# Patient Record
Sex: Male | Born: 1960 | Race: White | Hispanic: No | Smoking: Former smoker
Health system: Southern US, Community
[De-identification: ages and names within clinical notes are randomized; demographics above are authoritative.]

## PROBLEM LIST (undated history)

## (undated) DIAGNOSIS — C4490 Unspecified malignant neoplasm of skin, unspecified: Secondary | ICD-10-CM

## (undated) DIAGNOSIS — C8330 Diffuse large B-cell lymphoma, unspecified site: Secondary | ICD-10-CM

## (undated) DIAGNOSIS — Z87442 Personal history of urinary calculi: Secondary | ICD-10-CM

## (undated) DIAGNOSIS — R519 Headache, unspecified: Secondary | ICD-10-CM

## (undated) DIAGNOSIS — G720 Drug-induced myopathy: Secondary | ICD-10-CM

## (undated) DIAGNOSIS — I639 Cerebral infarction, unspecified: Secondary | ICD-10-CM

## (undated) DIAGNOSIS — F988 Other specified behavioral and emotional disorders with onset usually occurring in childhood and adolescence: Secondary | ICD-10-CM

## (undated) DIAGNOSIS — J449 Chronic obstructive pulmonary disease, unspecified: Secondary | ICD-10-CM

## (undated) DIAGNOSIS — T466X5A Adverse effect of antihyperlipidemic and antiarteriosclerotic drugs, initial encounter: Secondary | ICD-10-CM

## (undated) DIAGNOSIS — K219 Gastro-esophageal reflux disease without esophagitis: Secondary | ICD-10-CM

## (undated) DIAGNOSIS — E041 Nontoxic single thyroid nodule: Secondary | ICD-10-CM

## (undated) DIAGNOSIS — M76892 Other specified enthesopathies of left lower limb, excluding foot: Secondary | ICD-10-CM

## (undated) DIAGNOSIS — E785 Hyperlipidemia, unspecified: Secondary | ICD-10-CM

## (undated) DIAGNOSIS — I10 Essential (primary) hypertension: Secondary | ICD-10-CM

## (undated) DIAGNOSIS — M1990 Unspecified osteoarthritis, unspecified site: Secondary | ICD-10-CM

## (undated) DIAGNOSIS — M76891 Other specified enthesopathies of right lower limb, excluding foot: Secondary | ICD-10-CM

## (undated) DIAGNOSIS — G5603 Carpal tunnel syndrome, bilateral upper limbs: Secondary | ICD-10-CM

## (undated) HISTORY — PX: PORTACATH PLACEMENT: SHX2246

## (undated) HISTORY — PX: TONSILLECTOMY: SUR1361

## (undated) HISTORY — PX: LIVER BIOPSY: SHX301

## (undated) HISTORY — PX: MOHS SURGERY: SHX181

---

## 2004-11-02 ENCOUNTER — Other Ambulatory Visit: Payer: Self-pay

## 2004-11-02 ENCOUNTER — Observation Stay: Payer: Self-pay | Admitting: Internal Medicine

## 2004-11-17 ENCOUNTER — Ambulatory Visit: Payer: Self-pay | Admitting: Internal Medicine

## 2006-05-31 ENCOUNTER — Ambulatory Visit: Payer: Self-pay | Admitting: Internal Medicine

## 2006-07-14 ENCOUNTER — Ambulatory Visit: Payer: Self-pay | Admitting: Gastroenterology

## 2006-12-18 ENCOUNTER — Ambulatory Visit: Payer: Self-pay

## 2007-06-14 ENCOUNTER — Ambulatory Visit: Payer: Self-pay | Admitting: Internal Medicine

## 2013-10-19 ENCOUNTER — Emergency Department: Payer: Self-pay | Admitting: Internal Medicine

## 2013-10-21 ENCOUNTER — Emergency Department: Payer: Self-pay | Admitting: Emergency Medicine

## 2013-11-25 ENCOUNTER — Ambulatory Visit: Payer: Self-pay | Admitting: Orthopedic Surgery

## 2013-12-04 ENCOUNTER — Ambulatory Visit: Payer: Self-pay | Admitting: Internal Medicine

## 2014-08-13 ENCOUNTER — Ambulatory Visit
Admit: 2014-08-13 | Disposition: A | Discharge status: Home / Self Care | Payer: Self-pay | Attending: Internal Medicine | Admitting: Internal Medicine

## 2015-01-07 DIAGNOSIS — F988 Other specified behavioral and emotional disorders with onset usually occurring in childhood and adolescence: Secondary | ICD-10-CM | POA: Insufficient documentation
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
## 2016-05-12 ENCOUNTER — Ambulatory Visit: Payer: Self-pay | Admitting: Urology

## 2017-01-28 ENCOUNTER — Emergency Department
Admission: EM | Admit: 2017-01-28 | Discharge: 2017-01-28 | Disposition: A | Discharge status: Home / Self Care | Payer: Self-pay | Attending: Emergency Medicine | Admitting: Emergency Medicine

## 2017-01-28 ENCOUNTER — Encounter: Payer: Self-pay | Admitting: Emergency Medicine

## 2017-01-28 DIAGNOSIS — B372 Candidiasis of skin and nail: Secondary | ICD-10-CM | POA: Insufficient documentation

## 2017-01-28 DIAGNOSIS — Y929 Unspecified place or not applicable: Secondary | ICD-10-CM | POA: Insufficient documentation

## 2017-01-28 DIAGNOSIS — W57XXXA Bitten or stung by nonvenomous insect and other nonvenomous arthropods, initial encounter: Secondary | ICD-10-CM | POA: Insufficient documentation

## 2017-01-28 DIAGNOSIS — Y939 Activity, unspecified: Secondary | ICD-10-CM | POA: Insufficient documentation

## 2017-01-28 DIAGNOSIS — Z87891 Personal history of nicotine dependence: Secondary | ICD-10-CM | POA: Insufficient documentation

## 2017-01-28 DIAGNOSIS — S80862A Insect bite (nonvenomous), left lower leg, initial encounter: Secondary | ICD-10-CM | POA: Insufficient documentation

## 2017-01-28 DIAGNOSIS — Y999 Unspecified external cause status: Secondary | ICD-10-CM | POA: Insufficient documentation

## 2017-01-28 DIAGNOSIS — B3749 Other urogenital candidiasis: Secondary | ICD-10-CM | POA: Insufficient documentation

## 2017-01-28 DIAGNOSIS — L089 Local infection of the skin and subcutaneous tissue, unspecified: Secondary | ICD-10-CM | POA: Insufficient documentation

## 2017-01-28 DIAGNOSIS — I10 Essential (primary) hypertension: Secondary | ICD-10-CM | POA: Insufficient documentation

## 2017-01-28 HISTORY — DX: Gastro-esophageal reflux disease without esophagitis: K21.9

## 2017-01-28 HISTORY — DX: Other specified behavioral and emotional disorders with onset usually occurring in childhood and adolescence: F98.8

## 2017-01-28 HISTORY — DX: Essential (primary) hypertension: I10

## 2017-01-28 MED ORDER — MICONAZOLE NITRATE 2 % EX CREA: TOPICAL_CREAM | CUTANEOUS | Status: DC

## 2017-01-28 MED ORDER — BACITRACIN ZINC 500 UNIT/GM EX OINT
TOPICAL_OINTMENT | Freq: Once | CUTANEOUS | Status: AC
  Administered 2017-01-28: 1 via TOPICAL
  Filled 2017-01-28: qty 0.9

## 2017-01-28 MED ORDER — FLUCONAZOLE 200 MG PO TABS: 200.0000 mg | ORAL_TABLET | ORAL | 0 refills | Status: AC

## 2017-01-28 MED ORDER — MICONAZOLE NITRATE 2 % EX CREA
TOPICAL_CREAM | CUTANEOUS | 1 refills | Status: AC
Start: 2017-01-28 — End: 2018-01-28

## 2017-01-28 MED ORDER — CEPHALEXIN 500 MG PO CAPS
500.0000 mg | ORAL_CAPSULE | Freq: Once | ORAL | Status: AC
  Administered 2017-01-28: 500 mg via ORAL
  Filled 2017-01-28: qty 1

## 2017-01-28 MED ORDER — CEPHALEXIN 500 MG PO CAPS: 500.0000 mg | ORAL_CAPSULE | Freq: Three times a day (TID) | ORAL | 0 refills | Status: DC

## 2017-01-28 NOTE — ED Provider Notes (Signed)
[facility] Emergency Department Provider Note ____________________________________________  Time seen: 1707  I have reviewed the triage vital signs and the nursing notes.  HISTORY  Chief Complaint  Rash and Insect Bite  HPI Joel [name] is a 56 y.o. male Presents to the ED for evaluation of an itchy, red, and weeping rash to the left side of his inner thigh and groin. He describes the area has worsened over the last 10-12 days. He denies any significant benefit with home, over-the-counter treatments. Her reports intiially using cortisone cream, which caused the rash to worsen significantly.he then use some tenderness in spray, but denies any significant change. He presents with a hot, red, itchy rash to the groin that he describes as feeling like "50 bee stings." he denies any fevers, chills, sweats.  Past Medical History:  Diagnosis Date  . ADD (attention deficit disorder)   . GERD (gastroesophageal reflux disease)   . Hypertension    There are no active problems to display for this patient.  History reviewed. No pertinent surgical history.  Prior to Admission medications   Medication Sig Start Date End Date Taking? Authorizing Provider  cephALEXin (KEFLEX) 500 MG capsule Take 1 capsule (500 mg total) by mouth 3 (three) times daily. 01/28/17   Katriona Schmierer, Charlesetta Ivory, PA-C  fluconazole (DIFLUCAN) 200 MG tablet Take 1 tablet (200 mg total) by mouth once a week. 01/28/17 02/19/17  Mayte Diers, Charlesetta Ivory, PA-C  miconazole (MICATIN) 2 % cream Apply to affected area 2 times daily 01/28/17 01/28/18  Idell Hissong, Charlesetta Ivory, PA-C   Allergies Penicillins  Family History  Problem Relation Age of Onset  . Cancer Sister     Social History Social History  Substance Use Topics  . Smoking status: Former Games developer  . Smokeless tobacco: Never Used  . Alcohol use Yes    Review of Systems  Constitutional: Negative for fever. Cardiovascular: Negative for chest  pain. Respiratory: Negative for shortness of breath. Gastrointestinal: Negative for abdominal pain, vomiting and diarrhea. Genitourinary: Negative for dysuria. Musculoskeletal: Negative for back pain. Skin: positive for rash. Neurological: Negative for headaches, focal weakness or numbness. ____________________________________________  PHYSICAL EXAM:  VITAL SIGNS: ED Triage Vitals [01/28/17 1611]  Enc Vitals Group     BP 123/83     Pulse Rate (!) 109     Resp 18     Temp 99.5 F (37.5 C)     Temp Source Oral     SpO2 96 %     Weight 245 lb (111.1 kg)     Height  (1.753 m)     Head Circumference      Peak Flow      Pain Score      Pain Loc      Pain Edu?      Excl. in GC?     Constitutional: Alert and oriented. Well appearing and in no distress. Head: Normocephalic and atraumatic. Cardiovascular: Normal distal pulses. Respiratory: Normal respiratory effort.  Gastrointestinal: Soft and nontender. No distention. Musculoskeletal: Nontender with normal range of motion in all extremities.  Skin:  Skin is warm, dry and intact. Patient with a large area of intertriginous candidal infection noted to the left side of the groin and into the perineum. The area which is also under his small pannus, shows weeping, erythema, and was demarcated border. He also has a single infected insect bite to the lower left leg. There is surrounding erythema around the central scabbed lesion.  ____________________________________________  PROCEDURES  Keflex 500 mg PO Miconazole 2% cream ____________________________________________  INITIAL IMPRESSION / ASSESSMENT AND PLAN / ED COURSE  Patient with the ED evaluation The Lisa Infection to the Intertriginous Areas of His Abdomen and Groin. The Patient Is Discharged with Prescription for Miconazole Cream,  As well as fluconazole to dose weekly for 4 weeks. He is given instructions on hygiene care that should include wearing cotton or merino wool  underwear. He is also advised to dry the area well after cleansing with mild soap and to consider going without underwear while at home. He should follow up with the local community clinics for ongoing symptom management. ____________________________________________  FINAL CLINICAL IMPRESSION(S) / ED DIAGNOSES  Final diagnoses:  Candida infection of genital region  Intertriginous candidiasis  Infected insect bite of leg, left, initial encounter      Lissa Hoard, PA-C 01/28/17 1803    Sharman Cheek, MD 01/29/17 1615

## 2017-01-28 NOTE — Discharge Instructions (Signed)
Your are being treated for a candidal yeast infection. Use the anti-fungal ointment twice daily. Take the antibiotic as directed. Keep the skin of the groin clean and dry. Use cotton underwear to promote dry, healing skin. Follow-up with a local community clinic for continued symptoms.

## 2017-01-28 NOTE — ED Notes (Signed)
Telfa nonstick dressings applied to patient's L sided groin per PA's instructions.

## 2017-01-28 NOTE — ED Notes (Signed)
NAD noted at time of D/C. Pt denies questions or concerns. Pt ambulatory to the lobby at this time.  

## 2017-01-28 NOTE — ED Triage Notes (Signed)
Rash to inner groin on the left side and insect bite on left lower calf for the past 10 days, has tried home treatments but no relief. Pt reports rash in groin feels like "50 bee stings"

## 2017-05-31 ENCOUNTER — Emergency Department
Admission: EM | Admit: 2017-05-31 | Discharge: 2017-06-01 | Disposition: A | Discharge status: Home / Self Care | Payer: Self-pay | Attending: Emergency Medicine | Admitting: Emergency Medicine

## 2017-05-31 ENCOUNTER — Other Ambulatory Visit: Payer: Self-pay

## 2017-05-31 ENCOUNTER — Emergency Department: Payer: Self-pay

## 2017-05-31 DIAGNOSIS — Z87891 Personal history of nicotine dependence: Secondary | ICD-10-CM | POA: Insufficient documentation

## 2017-05-31 DIAGNOSIS — J111 Influenza due to unidentified influenza virus with other respiratory manifestations: Secondary | ICD-10-CM | POA: Insufficient documentation

## 2017-05-31 DIAGNOSIS — Z79899 Other long term (current) drug therapy: Secondary | ICD-10-CM | POA: Insufficient documentation

## 2017-05-31 DIAGNOSIS — M609 Myositis, unspecified: Secondary | ICD-10-CM | POA: Insufficient documentation

## 2017-05-31 DIAGNOSIS — R69 Illness, unspecified: Secondary | ICD-10-CM

## 2017-05-31 DIAGNOSIS — I10 Essential (primary) hypertension: Secondary | ICD-10-CM | POA: Insufficient documentation

## 2017-05-31 LAB — URINALYSIS, COMPLETE (UACMP) WITH MICROSCOPIC
BACTERIA UA: NONE SEEN
BILIRUBIN URINE: NEGATIVE
Glucose, UA: NEGATIVE mg/dL
Ketones, ur: 5 mg/dL — AB
LEUKOCYTES UA: NEGATIVE
NITRITE: NEGATIVE
PROTEIN: NEGATIVE mg/dL
Specific Gravity, Urine: 1.003 — ABNORMAL LOW (ref 1.005–1.030)
Squamous Epithelial / LPF: NONE SEEN
WBC UA: NONE SEEN WBC/hpf (ref 0–5)
pH: 6 (ref 5.0–8.0)

## 2017-05-31 LAB — BASIC METABOLIC PANEL
Anion gap: 11 (ref 5–15)
BUN: 24 mg/dL — AB (ref 6–20)
CO2: 22 mmol/L (ref 22–32)
Calcium: 9.2 mg/dL (ref 8.9–10.3)
Chloride: 104 mmol/L (ref 101–111)
Creatinine, Ser: 0.95 mg/dL (ref 0.61–1.24)
GFR calc Af Amer: >60 mL/min (ref >60)
Glucose, Bld: 98 mg/dL (ref 65–99)
POTASSIUM: 3.4 mmol/L — AB (ref 3.5–5.1)
Sodium: 137 mmol/L (ref 135–145)

## 2017-05-31 LAB — INFLUENZA PANEL BY PCR (TYPE A & B)
INFLAPCR: NEGATIVE
Influenza B By PCR: NEGATIVE

## 2017-05-31 LAB — CBC
HEMATOCRIT: 48.3 % (ref 40.0–52.0)
HEMOGLOBIN: 16.6 g/dL (ref 13.0–18.0)
MCH: 29.5 pg (ref 26.0–34.0)
MCHC: 34.5 g/dL (ref 32.0–36.0)
MCV: 85.5 fL (ref 80.0–100.0)
Platelets: 208 10*3/uL (ref 150–440)
RBC: 5.65 MIL/uL (ref 4.40–5.90)
RDW: 13.7 % (ref 11.5–14.5)
WBC: 10.6 10*3/uL (ref 3.8–10.6)

## 2017-05-31 LAB — CK: Total CK: 746 U/L — ABNORMAL HIGH (ref 49–397)

## 2017-05-31 LAB — TROPONIN I: Troponin I: <0.03 ng/mL (ref <0.03)

## 2017-05-31 MED ORDER — SODIUM CHLORIDE 0.9 % IV BOLUS (SEPSIS)
1000.0000 mL | Freq: Once | INTRAVENOUS | Status: AC
  Administered 2017-05-31: 1000 mL via INTRAVENOUS

## 2017-05-31 MED ORDER — NAPROXEN 500 MG PO TABS: 500.0000 mg | ORAL_TABLET | Freq: Two times a day (BID) | ORAL | 0 refills | Status: AC

## 2017-05-31 MED ORDER — IRBESARTAN 150 MG PO TABS
300.0000 mg | ORAL_TABLET | ORAL | Status: AC
  Administered 2017-05-31: 300 mg via ORAL
  Filled 2017-05-31: qty 2

## 2017-05-31 MED ORDER — ONDANSETRON 4 MG PO TBDP: 4.0000 mg | ORAL_TABLET | Freq: Three times a day (TID) | ORAL | 0 refills | Status: AC | PRN

## 2017-05-31 MED ORDER — ONDANSETRON HCL 4 MG/2ML IJ SOLN
4.0000 mg | Freq: Once | INTRAMUSCULAR | Status: AC
  Administered 2017-05-31: 4 mg via INTRAVENOUS
  Filled 2017-05-31: qty 2

## 2017-05-31 MED ORDER — HYDROCHLOROTHIAZIDE 25 MG PO TABS
25.0000 mg | ORAL_TABLET | ORAL | Status: AC
  Administered 2017-05-31: 25 mg via ORAL
  Filled 2017-05-31: qty 1

## 2017-05-31 MED ORDER — PANTOPRAZOLE SODIUM 40 MG IV SOLR
40.0000 mg | Freq: Once | INTRAVENOUS | Status: AC
  Administered 2017-05-31: 40 mg via INTRAVENOUS
  Filled 2017-05-31: qty 40

## 2017-05-31 MED ORDER — KETOROLAC TROMETHAMINE 30 MG/ML IJ SOLN
15.0000 mg | INTRAMUSCULAR | Status: AC
  Administered 2017-05-31: 15 mg via INTRAVENOUS
  Filled 2017-05-31: qty 1

## 2017-05-31 MED ORDER — DEXAMETHASONE SODIUM PHOSPHATE 10 MG/ML IJ SOLN
10.0000 mg | Freq: Once | INTRAMUSCULAR | Status: AC
  Administered 2017-05-31: 10 mg via INTRAVENOUS
  Filled 2017-05-31: qty 1

## 2017-05-31 MED ORDER — TELMISARTAN-HCTZ 80-25 MG PO TABS: 1.0000 | ORAL_TABLET | Freq: Every day | ORAL | 0 refills | Status: AC

## 2017-05-31 NOTE — ED Triage Notes (Signed)
Pt reports chest pain all day in midsternal area - c/o shortness of breath - diaphoresis noted - pt is clammy - c/o nausea - denies vomiting

## 2017-05-31 NOTE — ED Provider Notes (Signed)
[facility] Emergency Department Provider Note  ____________________________________________  Time seen: Approximately 10:23 PM  I have reviewed the triage vital signs and the nursing notes.   HISTORY  Chief Complaint Chest Pain    HPI Joel [name] is a 57 y.o. male who complains of constant chest painall day that started this morning. Nonradiating. Not associated with nausea or vomiting. He does have diaphoresis and shortness of breath. This is in the setting of generalized body aches. No exertional symptoms, not pleuritic. He reports feeling profoundly fatigued. He was in his usual state of health yesterday when he visited his daughter for dinner. Denies cough runny nose or sore throat. Has not had a flu shot this year. He rates the body aches as moderate to severe.   He also notes he is been out of his blood pressure medicine since October. He takes Micardis hct 80/25.  Past Medical History:  Diagnosis Date  . ADD (attention deficit disorder)   . GERD (gastroesophageal reflux disease)   . Hypertension      There are no active problems to display for this patient.    History reviewed. No pertinent surgical history.   Prior to Admission medications   Medication Sig Start Date End Date Taking? Authorizing Provider  cephALEXin (KEFLEX) 500 MG capsule Take 1 capsule (500 mg total) by mouth 3 (three) times daily. 01/28/17   Menshew, Charlesetta IvoryJenise V Bacon, PA-C  miconazole (MICATIN) 2 % cream Apply to affected area 2 times daily 01/28/17 01/28/18  Menshew, Charlesetta IvoryJenise V Bacon, PA-C  naproxen (NAPROSYN) 500 MG tablet Take 1 tablet (500 mg total) by mouth 2 (two) times daily with a meal. 05/31/17   Sharman CheekStafford, Teneisha Gignac, MD  ondansetron (ZOFRAN ODT) 4 MG disintegrating tablet Take 1 tablet (4 mg total) by mouth every 8 (eight) hours as needed for nausea or vomiting. 05/31/17   Sharman CheekStafford, Mykal Kirchman, MD  telmisartan-hydrochlorothiazide (MICARDIS HCT) 80-25 MG tablet Take 1  tablet by mouth daily. 05/31/17   Sharman CheekStafford, Esaiah Wanless, MD     Allergies Hydrocodone-acetaminophen and Penicillins   Family History  Problem Relation Age of Onset  . Cancer Sister     Social History Social History   Tobacco Use  . Smoking status: Former Games developermoker  . Smokeless tobacco: Never Used  Substance Use Topics  . Alcohol use: No    Frequency: Never  . Drug use: No    Review of Systems  Constitutional:   No fever positive chills.  ENT:   No sore throat. No rhinorrhea. Cardiovascular:   Positive chest pain as above without syncope. Respiratory:   No dyspnea or cough. Gastrointestinal:   Negative for abdominal pain, vomiting and diarrhea.  Musculoskeletal:   Positive generalized body aches All other systems reviewed and are negative except as documented above in ROS and HPI.  ____________________________________________   PHYSICAL EXAM:  VITAL SIGNS: ED Triage Vitals  Enc Vitals Group     BP 05/31/17 1937 (!) 181/101     Pulse Rate 05/31/17 1937 (!) 114     Resp 05/31/17 1937 17     Temp 05/31/17 1937 (!) 100.6 F (38.1 C)     Temp Source 05/31/17 1937 Oral     SpO2 05/31/17 1937 92 %     Weight 05/31/17 1935 240 lb (108.9 kg)     Height 05/31/17 1935 5\' 8"  (1.727 m)     Head Circumference --      Peak Flow --      Pain Score  05/31/17 1935 10     Pain Loc --      Pain Edu? --      Excl. in GC? --     Vital signs reviewed, nursing assessments reviewed.   Constitutional:   Alert and oriented. Well appearing and in no distress. Eyes:   No scleral icterus.  EOMI. No nystagmus. No conjunctival pallor. PERRL. ENT   Head:   Normocephalic and atraumatic.   Nose:   No congestion/rhinnorhea.    Mouth/Throat:   MMM, no pharyngeal erythema. No peritonsillar mass.    Neck:   No meningismus. Full ROM. Hematological/Lymphatic/Immunilogical:   No cervical lymphadenopathy. Cardiovascular:   Tachycardia heart rate 110. Symmetric bilateral radial and DP  pulses.  No murmurs.  Respiratory:   Normal respiratory effort without tachypnea/retractions. Breath sounds are clear and equal bilaterally. No wheezes/rales/rhonchi. Gastrointestinal:   Soft and nontender. Non distended. There is no CVA tenderness.  No rebound, rigidity, or guarding. Genitourinary:   deferred Musculoskeletal:   Normal range of motion in all extremities. No joint effusions.  No lower extremity tenderness.  No edema. Neurologic:   Normal speech and language.  Motor grossly intact. No acute focal neurologic deficits are appreciated.  Skin:    Skin is warm, dry and intact. No rash noted.  No petechiae, purpura, or bullae.  ____________________________________________    LABS (pertinent positives/negatives) (all labs ordered are listed, but only abnormal results are displayed) Labs Reviewed  BASIC METABOLIC PANEL - Abnormal; Notable for the following components:      Result Value   Potassium 3.4 (*)    BUN 24 (*)    All other components within normal limits  CK - Abnormal; Notable for the following components:   Total CK 746 (*)    All other components within normal limits  TROPONIN I  CBC  INFLUENZA PANEL BY PCR (TYPE A & B)  TROPONIN I   ____________________________________________   EKG  Interpreted by me Sinus tachycardia rate 1:15, left axis, normal intervals. LVH with repolarization abnormality. Right bundle-branch block. No acute ischemic changes.  ____________________________________________    RADIOLOGY  Dg Chest 2 View  Result Date: 05/31/2017 CLINICAL DATA:  58 year old male with a history of chest pain EXAM: CHEST  2 VIEW COMPARISON:  10/19/2013 FINDINGS: Cardiomediastinal silhouette unchanged in size and contour with tortuosity of the thoracic aorta. Coarsened interstitial markings. No confluent airspace disease. No pleural effusion or pneumothorax. No acute bony abnormality. IMPRESSION: Negative for acute cardiopulmonary disease Electronically  Signed   By: Gilmer Mor D.O.   On: 05/31/2017 19:56    ____________________________________________   PROCEDURES Procedures  ____________________________________________    CLINICAL IMPRESSION / ASSESSMENT AND PLAN / ED COURSE  Pertinent labs & imaging results that were available during my care of the patient were reviewed by me and considered in my medical decision making (see chart for details).   Patient presents with vague constitutional symptoms. Low-grade fever, tachycardia, consistent with dehydration. Influenza-like illness. Check a flu PCR. Initial labs and chest x-ray unremarkable. EKG nondiagnostic, nonischemic.Considering the patient's symptoms, medical history, and physical examination today, I have low suspicion for ACS, PE, TAD, pneumothorax, carditis, mediastinitis, pneumonia, CHF, or sepsis.  This appears most likely to be a viral syndrome. Treat symptomatically with IV fluids, Zofran, Toradol, Decadron, reassess. Check a second troponin.  Clinical Course as of Jun 01 2255  Wed May 31, 2017  2104 Mycardis hct 80/25 for blood pressure. Ambien, amphetamine salts for add/sleep  [PS]  2232 Flu  pcr negative. Serial troponin ordered.   [PS]  2248 Mild myositis. Continue IVF. If second troponin negative, would be suitable for outpt f/u with sx management. CK Total: (!) 746 [PS]    Clinical Course User Index [PS] Sharman Cheek, MD     ----------------------------------------- 10:58 PM on 05/31/2017 -----------------------------------------  Care patient will be signed out to Dr. Manson Passey at 11:00 PM to follow-up on second troponin  ____________________________________________   FINAL CLINICAL IMPRESSION(S) / ED DIAGNOSES    Final diagnoses:  Essential hypertension  Influenza-like illness  Myositis, unspecified myositis type, unspecified site       Portions of this note were generated with dragon dictation software. Dictation errors may occur  despite best attempts at proofreading.    Sharman Cheek, MD 05/31/17 2258

## 2017-06-01 LAB — TROPONIN I: Troponin I: <0.03 ng/mL (ref <0.03)

## 2017-10-31 ENCOUNTER — Encounter: Payer: Self-pay | Admitting: Nurse Practitioner

## 2017-11-02 ENCOUNTER — Telehealth: Payer: Self-pay

## 2017-11-02 NOTE — Telephone Encounter (Signed)
Returned patients call to schedule his colonoscopy, however unable to lvm due to mailbox being full.  Thanks [employer]le

## 2017-11-15 ENCOUNTER — Encounter: Payer: Self-pay | Admitting: *Deleted

## 2018-09-25 ENCOUNTER — Other Ambulatory Visit: Payer: Self-pay | Admitting: Family Medicine

## 2018-09-25 ENCOUNTER — Other Ambulatory Visit (HOSPITAL_COMMUNITY): Payer: Self-pay | Admitting: Emergency Medicine

## 2018-09-25 ENCOUNTER — Other Ambulatory Visit (HOSPITAL_COMMUNITY): Payer: Self-pay | Admitting: Family Medicine

## 2018-09-25 ENCOUNTER — Other Ambulatory Visit: Payer: Self-pay

## 2018-09-25 ENCOUNTER — Ambulatory Visit
Admission: RE | Admit: 2018-09-25 | Discharge: 2018-09-25 | Disposition: A | Discharge status: Home / Self Care | Payer: Self-pay | Source: Ambulatory Visit | Attending: Family Medicine | Admitting: Family Medicine

## 2018-09-25 DIAGNOSIS — N200 Calculus of kidney: Secondary | ICD-10-CM | POA: Insufficient documentation

## 2022-01-07 ENCOUNTER — Encounter: Payer: Self-pay | Admitting: *Deleted

## 2022-01-12 ENCOUNTER — Encounter: Payer: Self-pay | Admitting: Urology

## 2022-01-12 ENCOUNTER — Ambulatory Visit: Payer: BC Managed Care – PPO | Admitting: Urology

## 2022-01-12 VITALS — BP 121/85 | HR 111 | Ht 68.0 in | Wt 250.0 lb

## 2022-01-12 DIAGNOSIS — N401 Enlarged prostate with lower urinary tract symptoms: Secondary | ICD-10-CM | POA: Diagnosis not present

## 2022-01-12 DIAGNOSIS — N529 Male erectile dysfunction, unspecified: Secondary | ICD-10-CM

## 2022-01-12 DIAGNOSIS — R972 Elevated prostate specific antigen [PSA]: Secondary | ICD-10-CM | POA: Diagnosis not present

## 2022-01-12 MED ORDER — TAMSULOSIN HCL 0.4 MG PO CAPS: 0.4000 mg | ORAL_CAPSULE | Freq: Every day | ORAL | 11 refills | Status: DC

## 2022-01-12 NOTE — Progress Notes (Unsigned)
01/12/2022 2:52 PM   Joel [name] Joel [name] August 29, 1960 [id number]  Referring provider: Marguarite Arbour, MD [address] [facility] [place],  [place] [address]  Chief Complaint  Patient presents with   Elevated PSA    HPI: Joel [name] is a 61 y.o. male referred for evaluation of an elevated PSA.  PSA 11/29/2021 elevated 9.84; a repeat PSA performed 12/31/2021 was 10.83 Previous PSA December 2017 was 5.40 and 3.59 in October 2016 No family history prostate cancer He does have bothersome lower urinary tract symptoms and including sensation incomplete emptying, intermittent urinary stream, urgency, weak stream and nocturia x2 for the last 6-8 months. No prior treatment for his LUTS IPSS today 26/35 Also complains of difficulty achieving and maintaining an erection Organic risk factors include hypertension, antihypertensive medication and tobacco history   PMH: Past Medical History:  Diagnosis Date   ADD (attention deficit disorder)    GERD (gastroesophageal reflux disease)    Hypertension     Surgical History: History reviewed. No pertinent surgical history.  Home Medications:  Allergies as of 01/12/2022       Reactions   Hydrocodone-acetaminophen Other (See Comments)   Penicillins    Childhood allergy        Medication List        Accurate as of January 12, 2022  2:52 PM. If you have any questions, ask your nurse or doctor.          cephALEXin 500 MG capsule Commonly known as: Keflex Take 1 capsule (500 mg total) by mouth 3 (three) times daily.   naproxen 500 MG tablet Commonly known as: Naprosyn Take 1 tablet (500 mg total) by mouth 2 (two) times daily with a meal.   ondansetron 4 MG disintegrating tablet Commonly known as: Zofran ODT Take 1 tablet (4 mg total) by mouth every 8 (eight) hours as needed for nausea or vomiting.   telmisartan-hydrochlorothiazide 80-25 MG tablet Commonly known as: MICARDIS HCT Take 1 tablet by mouth  daily.   telmisartan-hydrochlorothiazide 80-25 MG tablet Commonly known as: MICARDIS HCT Take 1 tablet by mouth daily.        Allergies:  Allergies  Allergen Reactions   Hydrocodone-Acetaminophen Other (See Comments)   Penicillins     Childhood allergy    Family History: Family History  Problem Relation Age of Onset   Cancer Sister     Social History:  reports that he has quit smoking. He has never used smokeless tobacco. He reports that he does not drink alcohol and does not use drugs.   Physical Exam: BP 121/85   Pulse (!) 111   Ht 5\' 8"  (1.727 m)   Wt 250 lb (113.4 kg)   BMI 38.01 kg/m   Constitutional:  Alert and oriented, No acute distress. HEENT: [place] AT Respiratory: Normal respiratory effort, no increased work of breathing. GI: Abdomen is soft, nontender, nondistended, no abdominal masses GU: Prostate 40 g, smooth without nodules Psychiatric: Normal mood and affect.    Assessment & Plan:    1.  Elevated PSA Although PSA is a prostate cancer screening test he was informed that cancer is not the most common cause of an elevated PSA. Other potential causes including BPH and inflammation were discussed. He was informed that the only way to adequately diagnose prostate cancer would be a transrectal ultrasound and biopsy of the prostate. The procedure was discussed including potential risks of bleeding and infection/sepsis. He was also informed that a negative biopsy does  not conclusively rule out the possibility that prostate cancer may be present and that continued monitoring is required. The use of newer adjunctive blood tests including 4kScore was discussed. The use of multiparametric prostate MRI to assess abnormality suspicious for high-grade prostate cancer and aid in targeted biopsy was reviewed. Continued periodic surveillance was also discussed but not recommended. He would like to initially proceed with prostate MRI   2.  BPH with LUTS Severe LUTS Rx  tamsulosin 0.4 mg daily  3.  Erectile dysfunction Reassess on follow-up   Riki Altes, MD  [facility] [address], [address] [place], [place] [address] [phone]

## 2022-04-01 ENCOUNTER — Other Ambulatory Visit: Payer: Self-pay

## 2022-04-01 DIAGNOSIS — I10 Essential (primary) hypertension: Secondary | ICD-10-CM | POA: Insufficient documentation

## 2022-04-01 DIAGNOSIS — R0789 Other chest pain: Secondary | ICD-10-CM | POA: Insufficient documentation

## 2022-04-01 DIAGNOSIS — M5412 Radiculopathy, cervical region: Secondary | ICD-10-CM | POA: Insufficient documentation

## 2022-04-01 DIAGNOSIS — R200 Anesthesia of skin: Secondary | ICD-10-CM | POA: Insufficient documentation

## 2022-04-01 LAB — COMPREHENSIVE METABOLIC PANEL
ALT: 28 U/L (ref 0–44)
AST: 25 U/L (ref 15–41)
Albumin: 4.2 g/dL (ref 3.5–5.0)
Alkaline Phosphatase: 71 U/L (ref 38–126)
Anion gap: 12 (ref 5–15)
BUN: 20 mg/dL (ref 8–23)
CO2: 22 mmol/L (ref 22–32)
Calcium: 9.6 mg/dL (ref 8.9–10.3)
Chloride: 103 mmol/L (ref 98–111)
Creatinine, Ser: 1 mg/dL (ref 0.61–1.24)
GFR, Estimated: >60 mL/min (ref >60)
Glucose, Bld: 103 mg/dL — ABNORMAL HIGH (ref 70–99)
Potassium: 3.7 mmol/L (ref 3.5–5.1)
Sodium: 137 mmol/L (ref 135–145)
Total Bilirubin: 0.8 mg/dL (ref 0.3–1.2)
Total Protein: 7 g/dL (ref 6.5–8.1)

## 2022-04-01 LAB — CBC
HCT: 46 % (ref 39.0–52.0)
Hemoglobin: 15.6 g/dL (ref 13.0–17.0)
MCH: 29.5 pg (ref 26.0–34.0)
MCHC: 33.9 g/dL (ref 30.0–36.0)
MCV: 87 fL (ref 80.0–100.0)
Platelets: 253 10*3/uL (ref 150–400)
RBC: 5.29 MIL/uL (ref 4.22–5.81)
RDW: 13.2 % (ref 11.5–15.5)
WBC: 12.2 10*3/uL — ABNORMAL HIGH (ref 4.0–10.5)
nRBC: 0 % (ref 0.0–0.2)

## 2022-04-01 LAB — TROPONIN I (HIGH SENSITIVITY)
Troponin I (High Sensitivity): 5 ng/L (ref <18)
Troponin I (High Sensitivity): 5 ng/L (ref <18)

## 2022-04-01 NOTE — ED Notes (Signed)
EKG to EDP Siadecki in person.  

## 2022-04-01 NOTE — ED Notes (Signed)
Blue, lt grn, lav tubes sent to lab.

## 2022-04-01 NOTE — ED Triage Notes (Signed)
Pt in from home due to L arm sharp pain and numbness; 8/10 pain in chest; constant. SOB, nauseous x2 days. Denies sweatiness. History of HTN. Denies heart attacks or stent placement. Pt in NAD.

## 2022-04-01 NOTE — ED Provider Triage Note (Signed)
Emergency Medicine Provider Triage Evaluation Note  Joel [name] , a 61 y.o. male  was evaluated in triage.  Pt complains of chest pain. Pain in left arm started yesterday and now has left side chest pain with the arm pain. Pain is constant. Some shortness of breath and nausea as well.  Physical Exam  There were no vitals taken for this visit. Gen:   Awake, no distress   Resp:  Normal effort  MSK:   Moves extremities without difficulty  Other:    Medical Decision Making  Medically screening exam initiated at 6:13 PM.  Appropriate orders placed.  Joel [name] was informed that the remainder of the evaluation will be completed by another provider, this initial triage assessment does not replace that evaluation, and the importance of remaining in the ED until their evaluation is complete.   Chinita Pester, FNP 04/01/22 1814

## 2022-04-01 NOTE — ED Notes (Addendum)
Pt states has been working with PCP in relation to SOB intermittently x1 month. Denies swelling to any extremity.

## 2022-04-01 NOTE — ED Notes (Signed)
Provider CBT assessing pt.  

## 2022-04-02 ENCOUNTER — Emergency Department
Admission: EM | Admit: 2022-04-02 | Discharge: 2022-04-02 | Disposition: A | Discharge status: Home / Self Care | Payer: Self-pay | Attending: Emergency Medicine | Admitting: Emergency Medicine

## 2022-04-02 DIAGNOSIS — R200 Anesthesia of skin: Secondary | ICD-10-CM

## 2022-04-02 DIAGNOSIS — M5412 Radiculopathy, cervical region: Secondary | ICD-10-CM

## 2022-04-02 DIAGNOSIS — R0789 Other chest pain: Secondary | ICD-10-CM

## 2022-04-02 MED ORDER — GABAPENTIN 100 MG PO CAPS: 100.0000 mg | ORAL_CAPSULE | Freq: Three times a day (TID) | ORAL | 0 refills | Status: DC

## 2022-04-02 MED ORDER — GABAPENTIN 100 MG PO CAPS
100.0000 mg | ORAL_CAPSULE | ORAL | Status: AC
  Administered 2022-04-02: 100 mg via ORAL
  Filled 2022-04-02: qty 1

## 2022-04-02 NOTE — ED Notes (Signed)
Pt is pacing back and forth in room ready to leave.

## 2022-04-02 NOTE — Discharge Instructions (Addendum)
Your workup in the Emergency Department today was reassuring.  We did not find any specific abnormalities.  We recommend you drink plenty of fluids, take your regular medications and/or any new ones prescribed today, and follow up with the doctor(s) listed in these documents as recommended.  We put in a referral to cardiology and they should be reaching out to you at the next available opportunity to schedule a follow-up appointment.  You can also follow-up with your regular doctor to discuss the symptoms in your arm that appeared to be cervical radiculopathy she does have some additional work-up is needed.  Return to the Emergency Department if you develop new or worsening symptoms that concern you.

## 2022-04-02 NOTE — ED Provider Notes (Signed)
[facility] Provider Note    Event Date/Time   First MD Initiated Contact with Patient 04/02/22 0006     (approximate)   History   No chief complaint on file.   HPI  Joel [name] is a 61 y.o. male who presents for evaluation of numbness and aching pain in his left arm that has been present for a couple of days.  He does not remember any particular injury but he said that it hurts worse when he raises his arm up in certain positions including over his head.  He does not really have any pain in his neck but sometimes it seems to radiate up through his shoulder to the base of the left side of his neck.  He has never had these kinds of symptoms before.  He does not believe he had any particular kind of trauma or injury, at least that he can remember.  He has had no weakness in the arm, just the pain that is making it very difficult for him to rest or to think about anything else.  He notes that for a couple of weeks he has also had some pain in his chest and occasionally felt short of breath particular with exertion.  Nothing in particular seems to make it better or worse but it has been worrying him.  Then when the left arm pain started he became concerned that something worse might be happening.  He has no history of heart disease or ACS, no known lung disease, no history of blood clots in the legs of the lungs, no recent surgeries or immobilizations, no unilateral leg pain or swelling.     Physical Exam   Triage Vital Signs: ED Triage Vitals  Enc Vitals Group     BP 04/01/22 1813 127/73     Pulse Rate 04/01/22 1813 95     Resp 04/01/22 1813 19     Temp 04/01/22 1813 98.6 F (37 C)     Temp Source 04/01/22 1813 Oral     SpO2 04/01/22 1813 99 %     Weight 04/01/22 1815 106.6 kg (235 lb)     Height 04/01/22 1815 1.702 m (5\' 7" )     Head Circumference --      Peak Flow --      Pain Score 04/01/22 1814 8     Pain Loc --      Pain Edu? --      Excl. in  GC? --     Most recent vital signs: Vitals:   04/01/22 1813 04/01/22 2245  BP: 127/73 (!) 134/108  Pulse: 95 88  Resp: 19 18  Temp: 98.6 F (37 C) 98.6 F (37 C)  SpO2: 99% 97%     General: Awake, no distress.  CV:  Good peripheral perfusion.  Normal heart sounds.  Regular rate and rhythm. Resp:  Normal effort.  Lungs are clear to auscultation bilaterally. Abd:  No distention.  No tenderness to palpation. Other:  The patient has some tenderness to manipulation of the left arm with range of motion of the shoulder and particularly with raising the arm above the head.  This would suggest musculoskeletal or neuropathic pain but he does not have a particular distribution of pain in the arm that would suggest, for example, either ulnar or radial pain.  The compartments are soft and easily compressible, normal range of motion in spite of discomfort.  He has no focal neurological deficits and good grip  strength in bilateral major muscle strength in his arms and his legs.  He is ambulatory without any difficulty.   ED Results / Procedures / Treatments   Labs (all labs ordered are listed, but only abnormal results are displayed) Labs Reviewed  CBC - Abnormal; Notable for the following components:      Result Value   WBC 12.2 (*)    All other components within normal limits  COMPREHENSIVE METABOLIC PANEL - Abnormal; Notable for the following components:   Glucose, Bld 103 (*)    All other components within normal limits  TROPONIN I (HIGH SENSITIVITY)  TROPONIN I (HIGH SENSITIVITY)     EKG  ED ECG REPORT I, Loleta Rose, the attending physician, personally viewed and interpreted this ECG.  Date: 04/01/2022 EKG Time: 18: 18 Rate: 87 Rhythm: normal sinus rhythm QRS Axis: normal Intervals: Bifascicular block (right bundle blanch block and left anterior fascicular block), LVH ST/T Wave abnormalities: Non-specific ST segment / T-wave changes, but no clear evidence of acute  ischemia. Narrative Interpretation: no definitive evidence of acute ischemia; does not meet STEMI criteria.    PROCEDURES:  Critical Care performed: No  Procedures   MEDICATIONS ORDERED IN ED: Medications  gabapentin (NEURONTIN) capsule 100 mg (100 mg Oral Given 04/02/22 0148)     IMPRESSION / MDM / ASSESSMENT AND PLAN / ED COURSE  I reviewed the triage vital signs and the nursing notes.                              Differential diagnosis includes, but is not limited to, musculoskeletal pain, cervical radiculopathy or other neuropathic pain, ACS, PE.  Patient's presentation is most consistent with acute presentation with potential threat to life or bodily function.  Labs/studies ordered: EKG, high-sensitivity troponin x2, CMP, CBC.  Patient has a Wells score for PE of 0 and he is low risk for ACS based on HEAR score.  His lab results are reassuring including 2 completely normal and unchanged high-sensitivity troponins.  He has a very slight elevation of his white blood cell count of 12.2 but this is of unclear clinical significance in the absence of any significant infectious symptoms.  He has reproducible pain in his left arm and his description of it is a dull and aching pain that is bothersome to him particularly when he is trying to rest is suggestive of neuropathic pain.  I talked with him about various options, and I considered hospitalization for further cardiac work-up, but given the duration of his symptoms (at least weeks for his chest pain) and how the left arm symptoms do not seem to be related to the chest, as well as his reassuring EKG and high-sensitivity troponins, I think he is appropriate for discharge and close outpatient follow-up.  I talked with him about treating the pain in his arm which is his greatest concern and we agreed to try a course of gabapentin while he is pending outpatient follow-up.  I referred him to cardiology and gave strict return precautions.  He  understands and agrees with the plan.       FINAL CLINICAL IMPRESSION(S) / ED DIAGNOSES   Final diagnoses:  Atypical chest pain  Numbness and tingling in left arm  Cervical radiculopathy     Rx / DC Orders   ED Discharge Orders          Ordered    gabapentin (NEURONTIN) 100 MG capsule  3 times  daily        04/02/22 0130    Ambulatory referral to Cardiology       Comments: If you have not heard from the Cardiology office within the next 72 hours please call [phone].   04/02/22 0130             Note:  This document was prepared using Dragon voice recognition software and may include unintentional dictation errors.   Loleta Rose, MD 04/02/22 [phone]

## 2022-04-15 ENCOUNTER — Telehealth: Payer: Self-pay | Admitting: Family Medicine

## 2022-04-15 NOTE — Telephone Encounter (Signed)
Pt scheduled an STI appt and would like some information in regards to the different tests he will be having. Please call him back. Thanks

## 2022-04-25 ENCOUNTER — Ambulatory Visit: Payer: Self-pay | Admitting: Family Medicine

## 2022-04-25 ENCOUNTER — Encounter: Payer: Self-pay | Admitting: Family Medicine

## 2022-04-25 DIAGNOSIS — I10 Essential (primary) hypertension: Secondary | ICD-10-CM | POA: Insufficient documentation

## 2022-04-25 DIAGNOSIS — Z113 Encounter for screening for infections with a predominantly sexual mode of transmission: Secondary | ICD-10-CM

## 2022-04-25 LAB — HM HEPATITIS C SCREENING LAB: HM Hepatitis Screen: NEGATIVE

## 2022-04-25 LAB — HM HIV SCREENING LAB: HM HIV Screening: NEGATIVE

## 2022-04-25 NOTE — Progress Notes (Signed)
Joel [name]  Subjective:  Joel [name] is a 61 y.o. male being seen today for an STI screening [name]. The patient reports they do not have symptoms.    Patient has the following medical conditions:   Patient Active Problem List   Diagnosis Date Noted   Essential (primary) hypertension 04/25/2022     Chief Complaint  Patient presents with   SEXUALLY TRANSMITTED DISEASE    Screening- patient stated he is not having any symptoms     HPI  Patient reports to clinic for STI testing.   Last HIV test per patient/review of record was No results found for: "HMHIVSCREEN" No results found for: "HIV"  Does the patient or their partner desires a pregnancy in the next year? No  Screening for MPX risk: Does the patient have an unexplained rash? No Is the patient MSM? No Does the patient endorse multiple sex partners or anonymous sex partners? No Did the patient have close or sexual contact with a person diagnosed with MPX? No Has the patient traveled outside the [place] where MPX is endemic? No Is there a high clinical suspicion for MPX-- evidenced by one of the following No  -Unlikely to be chickenpox  -Lymphadenopathy  -Rash that present in same phase of evolution on any given body part   See flowsheet for further details and programmatic requirements.    There is no immunization history on file for this patient.   The following portions of the patient's history were reviewed and updated as appropriate: allergies, current medications, past medical history, past social history, past surgical history and problem list.  Objective:  There were no vitals filed for this [name].  Physical Exam Vitals and nursing note reviewed.  Constitutional:      Appearance: Normal appearance.  HENT:     Head: Normocephalic and atraumatic.     Mouth/Throat:     Mouth: Mucous membranes are moist.     Pharynx: No oropharyngeal exudate or posterior  oropharyngeal erythema.  Eyes:     General:        Right eye: No discharge.        Left eye: No discharge.     Conjunctiva/sclera:     Right eye: Right conjunctiva is not injected. No exudate.    Left eye: Left conjunctiva is not injected. No exudate. Pulmonary:     Effort: Pulmonary effort is normal.  Abdominal:     General: Abdomen is flat.     Palpations: Abdomen is soft. There is no hepatomegaly or mass.     Tenderness: There is no abdominal tenderness. There is no rebound.  Lymphadenopathy:     Cervical: No cervical adenopathy.     Upper Body:     Right upper body: No supraclavicular or axillary adenopathy.     Left upper body: No supraclavicular or axillary adenopathy.  Skin:    General: Skin is warm and dry.  Neurological:     Mental Status: He is alert and oriented to person, place, and time.       Assessment and Plan:  Joel [name] is a 61 y.o. male presenting to the [facility] Department for STI screening  1. Screening for venereal disease    Patient does not have STI symptoms Patient accepted all screenings including   Patient meets criteria for HepB screening? No. Ordered? not applicable Patient meets criteria for HepC screening? Yes. Ordered? yes Recommended condom use with all sex Discussed importance  of condom use for STI prevent  Treat per standing order Discussed time line for State Lab results and that patient will be called with positive results and encouraged patient to call if he had not heard in 2 weeks Recommended returning for continued or worsening symptoms.   Return if symptoms worsen or fail to improve.  Total time spent 20 minutes.  Lenice Llamas, [place]

## 2022-04-30 LAB — GONOCOCCUS CULTURE

## 2022-04-30 LAB — CHLAMYDIA/GC NAA, CONFIRMATION
Chlamydia trachomatis, NAA: NEGATIVE
Neisseria gonorrhoeae, NAA: NEGATIVE

## 2022-05-20 NOTE — Addendum Note (Signed)
Addended by: Cletis Media on: 05/20/2022 04:03 PM   Modules accepted: Orders

## 2023-02-12 ENCOUNTER — Other Ambulatory Visit: Payer: Self-pay | Admitting: Urology

## 2023-03-23 ENCOUNTER — Ambulatory Visit: Payer: BC Managed Care – PPO | Admitting: Urology

## 2023-06-08 ENCOUNTER — Other Ambulatory Visit: Payer: Self-pay | Admitting: Dermatology

## 2023-06-08 ENCOUNTER — Encounter: Payer: Self-pay | Admitting: Student

## 2023-06-08 DIAGNOSIS — D0439 Carcinoma in situ of skin of other parts of face: Secondary | ICD-10-CM

## 2023-06-13 ENCOUNTER — Ambulatory Visit
Admission: RE | Admit: 2023-06-13 | Discharge: 2023-06-13 | Disposition: A | Discharge status: Home / Self Care | Payer: Self-pay | Source: Ambulatory Visit | Attending: Dermatology | Admitting: Dermatology

## 2023-06-13 DIAGNOSIS — D0439 Carcinoma in situ of skin of other parts of face: Secondary | ICD-10-CM

## 2023-06-13 MED ORDER — IOPAMIDOL (ISOVUE-370) INJECTION 76%
75.0000 mL | Freq: Once | INTRAVENOUS | Status: AC | PRN
  Administered 2023-06-13: 75 mL via INTRAVENOUS

## 2023-07-05 DIAGNOSIS — E041 Nontoxic single thyroid nodule: Secondary | ICD-10-CM | POA: Insufficient documentation

## 2023-07-20 ENCOUNTER — Emergency Department: Payer: Self-pay

## 2023-07-20 ENCOUNTER — Other Ambulatory Visit: Payer: Self-pay

## 2023-07-20 ENCOUNTER — Emergency Department
Admission: EM | Admit: 2023-07-20 | Discharge: 2023-07-20 | Disposition: A | Discharge status: Home / Self Care | Payer: Self-pay | Attending: Emergency Medicine | Admitting: Emergency Medicine

## 2023-07-20 DIAGNOSIS — I10 Essential (primary) hypertension: Secondary | ICD-10-CM | POA: Insufficient documentation

## 2023-07-20 DIAGNOSIS — R0789 Other chest pain: Secondary | ICD-10-CM | POA: Insufficient documentation

## 2023-07-20 DIAGNOSIS — Z8673 Personal history of transient ischemic attack (TIA), and cerebral infarction without residual deficits: Secondary | ICD-10-CM | POA: Insufficient documentation

## 2023-07-20 DIAGNOSIS — R079 Chest pain, unspecified: Secondary | ICD-10-CM

## 2023-07-20 LAB — CBC
HCT: 44.2 % (ref 39.0–52.0)
Hemoglobin: 15.1 g/dL (ref 13.0–17.0)
MCH: 30.3 pg (ref 26.0–34.0)
MCHC: 34.2 g/dL (ref 30.0–36.0)
MCV: 88.6 fL (ref 80.0–100.0)
Platelets: 206 10*3/uL (ref 150–400)
RBC: 4.99 MIL/uL (ref 4.22–5.81)
RDW: 13.6 % (ref 11.5–15.5)
WBC: 7.6 10*3/uL (ref 4.0–10.5)
nRBC: 0 % (ref 0.0–0.2)

## 2023-07-20 LAB — TSH: TSH: 5.683 u[IU]/mL — ABNORMAL HIGH (ref 0.350–4.500)

## 2023-07-20 LAB — BASIC METABOLIC PANEL
Anion gap: 11 (ref 5–15)
BUN: 21 mg/dL (ref 8–23)
CO2: 25 mmol/L (ref 22–32)
Calcium: 9 mg/dL (ref 8.9–10.3)
Chloride: 102 mmol/L (ref 98–111)
Creatinine, Ser: 0.94 mg/dL (ref 0.61–1.24)
GFR, Estimated: >60 mL/min (ref >60)
Glucose, Bld: 121 mg/dL — ABNORMAL HIGH (ref 70–99)
Potassium: 3.1 mmol/L — ABNORMAL LOW (ref 3.5–5.1)
Sodium: 138 mmol/L (ref 135–145)

## 2023-07-20 LAB — TROPONIN I (HIGH SENSITIVITY)
Troponin I (High Sensitivity): 9 ng/L (ref <18)
Troponin I (High Sensitivity): 10 ng/L (ref <18)

## 2023-07-20 LAB — T4, FREE: Free T4: 1.03 ng/dL (ref 0.61–1.12)

## 2023-07-20 MED ORDER — POTASSIUM CHLORIDE CRYS ER 20 MEQ PO TBCR
40.0000 meq | EXTENDED_RELEASE_TABLET | Freq: Once | ORAL | Status: AC
  Administered 2023-07-20: 40 meq via ORAL
  Filled 2023-07-20: qty 2

## 2023-07-20 MED ORDER — ALUM & MAG HYDROXIDE-SIMETH 200-200-20 MG/5ML PO SUSP
30.0000 mL | Freq: Once | ORAL | Status: AC
  Administered 2023-07-20: 30 mL via ORAL
  Filled 2023-07-20: qty 30

## 2023-07-20 MED ORDER — ACETAMINOPHEN 500 MG PO TABS
1000.0000 mg | ORAL_TABLET | Freq: Once | ORAL | Status: AC
  Administered 2023-07-20: 1000 mg via ORAL
  Filled 2023-07-20: qty 2

## 2023-07-20 NOTE — ED Provider Notes (Addendum)
 [facility] Provider Note    Event Date/Time   First MD Initiated Contact with Patient 07/20/23 0522     (approximate)   History   Chest Pain   HPI Joel [name] is a 63 y.o. male with history of HTN presenting today for chest pain.  Patient states he was recently seen by his primary care provider where a CT scan of his head showed evidence of possible old CVA.  Does not have any strokelike symptoms but they did start him on a new cholesterol and blood pressure medication.  He states over the past 4 to 5 days he has had intermittent chest pressure symptoms.  States he woke up overnight with worsening chest pressure.  No radiation of the pain.  No obvious shortness of breath, nausea, vomiting, sweating associated with it.  Was given aspirin 324 mg with EMS.  Patient has multiple complaints that have been going on for a long time including intermittent headaches, abdominal pain, nausea which is not currently present.  He is worried he may be having side effects related to initiation of the new medications.  He is previously had myalgias related to statins.     Physical Exam   Triage Vital Signs: ED Triage Vitals  Encounter Vitals Group     BP 07/20/23 0529 125/89     Systolic BP Percentile --      Diastolic BP Percentile --      Pulse Rate 07/20/23 0529 68     Resp 07/20/23 0529 17     Temp 07/20/23 0529 98.2 F (36.8 C)     Temp Source 07/20/23 0529 Oral     SpO2 07/20/23 0522 96 %     Weight 07/20/23 0530 235 lb (106.6 kg)     Height 07/20/23 0530 5\' 6"  (1.676 m)     Head Circumference --      Peak Flow --      Pain Score 07/20/23 0530 8     Pain Loc --      Pain Education --      Exclude from Growth Chart --     Most recent vital signs: Vitals:   07/20/23 0522 07/20/23 0529  BP:  125/89  Pulse:  68  Resp:  17  Temp:  98.2 F (36.8 C)  SpO2: 96% 96%   Physical Exam: I have reviewed the vital signs and nursing notes. General: Awake,  alert, no acute distress.  Nontoxic appearing. Head:  Atraumatic, normocephalic.   ENT:  EOM intact, PERRL. Oral mucosa is pink and moist with no lesions. Neck: Neck is supple with full range of motion, No meningeal signs. Cardiovascular:  RRR, No murmurs. Peripheral pulses palpable and equal bilaterally. Respiratory:  Symmetrical chest wall expansion.  No rhonchi, rales, or wheezes.  Good air movement throughout.  No use of accessory muscles.   Musculoskeletal:  No cyanosis or edema. Moving extremities with full ROM Abdomen:  Soft, nontender, nondistended. Neuro:  GCS 15, moving all four extremities, interacting appropriately. Speech clear. Psych:  Calm, appropriate.   Skin:  Warm, dry, no rash.    ED Results / Procedures / Treatments   Labs (all labs ordered are listed, but only abnormal results are displayed) Labs Reviewed  BASIC METABOLIC PANEL - Abnormal; Notable for the following components:      Result Value   Potassium 3.1 (*)    Glucose, Bld 121 (*)    All other components within normal limits  CBC  T4, FREE  TSH  TROPONIN I (HIGH SENSITIVITY)     EKG My EKG interpretation: Rate of 66, sinus rhythm with first-degree AV block.  QTc 465.  Questionable right bundle branch block with left anterior fascicular block.  Otherwise no obvious ST elevations or depressions   RADIOLOGY Independently interpreted chest x-ray with no acute pathology   PROCEDURES:  Critical Care performed: No  Procedures   MEDICATIONS ORDERED IN ED: Medications  potassium chloride SA (KLOR-CON M) CR tablet 40 mEq (has no administration in time range)  alum & mag hydroxide-simeth (MAALOX/MYLANTA) 200-200-20 MG/5ML suspension 30 mL (has no administration in time range)  acetaminophen (TYLENOL) tablet 1,000 mg (1,000 mg Oral Given 07/20/23 0617)     IMPRESSION / MDM / ASSESSMENT AND PLAN / ED COURSE  I reviewed the triage vital signs and the nursing notes.                               Differential diagnosis includes, but is not limited to, ACS, medication side effect, reflux, costochondritis, pneumonia, pneumothorax, anxiety  Patient's presentation is most consistent with acute complicated illness / injury requiring diagnostic workup.  Patient is a 63 year old male presenting today for intermittent chest pain symptoms for many days which worsened overnight.  Currently his symptoms are very mild to nonexistent.  He has no associated symptoms at this time like radiation of the pain, nausea, sweating, or shortness of breath.  EKG without obvious ischemic changes and first troponin at 9.  CBC otherwise normal.  BMP shows slight hypokalemia at 3.1 which she was given oral repletion for.  Chest x-ray otherwise negative.  Patient already received aspirin with EMS.  Patient has multitude of complaints which are hard to differentiate between acute and chronic.  Patient has a heart score of 3 and will get repeat troponin to rule out any evidence of ACS although story does not seem highly indicative of it.  Patient signed out to oncoming provider pending repeat troponin and reassessment.  Will also try Mylanta to treat possible reflux symptoms.  He was reassessed with essentially no ongoing symptoms at this time.  We discussed if normal troponin if he wished to be admitted for further cardiac workup or have outpatient follow-up.  He stated he felt comfortable following up with cardiology outpatient would prefer that.  I do feel safe with that if his troponin on repeat is continued to be stable.  The patient is on the cardiac monitor to evaluate for evidence of arrhythmia and/or significant heart rate changes. Clinical Course as of 07/20/23 0657  Thu Jul 20, 2023  0656 Reassessed.  Asymptomatic [DW]    Clinical Course User Index [DW] Janith Lima, MD     FINAL CLINICAL IMPRESSION(S) / ED DIAGNOSES   Final diagnoses:  Chest pain, unspecified type     Rx / DC Orders   ED  Discharge Orders     None        Note:  This document was prepared using Dragon voice recognition software and may include unintentional dictation errors.   Janith Lima, MD 07/20/23 7846    Janith Lima, MD 07/20/23 [phone]

## 2023-07-20 NOTE — ED Provider Notes (Signed)
-----------------------------------------   8:37 AM on 07/20/2023 -----------------------------------------  I took over care on this patient from Dr. Anner Crete.  Repeat troponin is negative.  On reassessment, the patient appears well and remains asymptomatic.  He is stable for discharge home at this time.  Return precautions provided, the patient expressed understanding.  Cardiology referral order has been placed by Dr. Anner Crete.   Dionne Bucy, MD 07/20/23 [phone]

## 2023-07-20 NOTE — Discharge Instructions (Signed)
 I have placed a referral for you to have cardiology follow-up.  You should expect a call within the next several days to get this appointment set up.  They can also help with management of your blood pressure and cholesterol medications as well.

## 2023-07-20 NOTE — ED Triage Notes (Signed)
 Pt arrives via EMS from home with complaints of chest pain that has been going on about a week, however around 2am tonight pt states it has gotten worse 8/10 with SOB, lightheadedness.

## 2023-08-10 ENCOUNTER — Encounter: Payer: Self-pay | Admitting: *Deleted

## 2023-08-17 ENCOUNTER — Encounter: Payer: Self-pay | Admitting: Emergency Medicine

## 2023-08-18 ENCOUNTER — Encounter: Payer: Self-pay | Admitting: *Deleted

## 2023-08-18 ENCOUNTER — Encounter
Admission: RE | Disposition: A | Discharge status: Home / Self Care | Payer: Self-pay | Source: Home / Self Care | Attending: Gastroenterology

## 2023-08-18 ENCOUNTER — Ambulatory Visit
Admission: RE | Admit: 2023-08-18 | Discharge: 2023-08-18 | Disposition: A | Discharge status: Home / Self Care | Payer: Self-pay | Attending: Gastroenterology | Admitting: Gastroenterology

## 2023-08-18 ENCOUNTER — Other Ambulatory Visit: Payer: Self-pay

## 2023-08-18 ENCOUNTER — Ambulatory Visit: Payer: Self-pay | Admitting: Anesthesiology

## 2023-08-18 DIAGNOSIS — K5730 Diverticulosis of large intestine without perforation or abscess without bleeding: Secondary | ICD-10-CM | POA: Insufficient documentation

## 2023-08-18 DIAGNOSIS — D124 Benign neoplasm of descending colon: Secondary | ICD-10-CM | POA: Insufficient documentation

## 2023-08-18 DIAGNOSIS — Z1211 Encounter for screening for malignant neoplasm of colon: Secondary | ICD-10-CM | POA: Insufficient documentation

## 2023-08-18 DIAGNOSIS — I10 Essential (primary) hypertension: Secondary | ICD-10-CM | POA: Insufficient documentation

## 2023-08-18 DIAGNOSIS — J449 Chronic obstructive pulmonary disease, unspecified: Secondary | ICD-10-CM | POA: Insufficient documentation

## 2023-08-18 DIAGNOSIS — E785 Hyperlipidemia, unspecified: Secondary | ICD-10-CM | POA: Insufficient documentation

## 2023-08-18 DIAGNOSIS — F909 Attention-deficit hyperactivity disorder, unspecified type: Secondary | ICD-10-CM | POA: Insufficient documentation

## 2023-08-18 DIAGNOSIS — K640 First degree hemorrhoids: Secondary | ICD-10-CM | POA: Insufficient documentation

## 2023-08-18 DIAGNOSIS — K219 Gastro-esophageal reflux disease without esophagitis: Secondary | ICD-10-CM | POA: Insufficient documentation

## 2023-08-18 DIAGNOSIS — D123 Benign neoplasm of transverse colon: Secondary | ICD-10-CM | POA: Insufficient documentation

## 2023-08-18 HISTORY — DX: Carpal tunnel syndrome, bilateral upper limbs: G56.03

## 2023-08-18 HISTORY — DX: Other specified enthesopathies of right lower limb, excluding foot: M76.892

## 2023-08-18 HISTORY — DX: Nontoxic single thyroid nodule: E04.1

## 2023-08-18 HISTORY — DX: Adverse effect of antihyperlipidemic and antiarteriosclerotic drugs, initial encounter: G72.0

## 2023-08-18 HISTORY — PX: POLYPECTOMY: SHX149

## 2023-08-18 HISTORY — DX: Adverse effect of antihyperlipidemic and antiarteriosclerotic drugs, initial encounter: T46.6X5A

## 2023-08-18 HISTORY — DX: Chronic obstructive pulmonary disease, unspecified: J44.9

## 2023-08-18 HISTORY — DX: Hyperlipidemia, unspecified: E78.5

## 2023-08-18 HISTORY — PX: COLONOSCOPY: SHX5424

## 2023-08-18 HISTORY — DX: Cerebral infarction, unspecified: I63.9

## 2023-08-18 HISTORY — DX: Other specified enthesopathies of right lower limb, excluding foot: M76.891

## 2023-08-18 HISTORY — DX: Headache, unspecified: R51.9

## 2023-08-18 SURGERY — COLONOSCOPY
Anesthesia: General

## 2023-08-18 MED ORDER — LIDOCAINE HCL (CARDIAC) PF 100 MG/5ML IV SOSY
PREFILLED_SYRINGE | INTRAVENOUS | Status: DC | PRN
  Administered 2023-08-18: 50 mg via INTRAVENOUS

## 2023-08-18 MED ORDER — PROPOFOL 10 MG/ML IV BOLUS
INTRAVENOUS | Status: DC | PRN
  Administered 2023-08-18 (×2): 30 mg via INTRAVENOUS
  Administered 2023-08-18: 70 mg via INTRAVENOUS

## 2023-08-18 MED ORDER — PROPOFOL 500 MG/50ML IV EMUL
INTRAVENOUS | Status: DC | PRN
  Administered 2023-08-18: 120 ug/kg/min via INTRAVENOUS

## 2023-08-18 MED ORDER — SODIUM CHLORIDE 0.9 % IV SOLN: INTRAVENOUS | Status: DC

## 2023-08-18 NOTE — Anesthesia Postprocedure Evaluation (Signed)
 Anesthesia Post Note  Patient: Joel [name]  Procedure(s) Performed: COLONOSCOPY POLYPECTOMY, INTESTINE  Patient location during evaluation: Endoscopy Anesthesia Type: General Level of consciousness: awake and alert Pain management: pain level controlled Vital Signs Assessment: post-procedure vital signs reviewed and stable Respiratory status: spontaneous breathing, nonlabored ventilation, respiratory function stable and patient connected to nasal cannula oxygen Cardiovascular status: blood pressure returned to baseline and stable Postop Assessment: no apparent nausea or vomiting Anesthetic complications: no   No notable events documented.   Last Vitals:  Vitals:   08/18/23 1307 08/18/23 1308  BP: 110/72 110/72  Pulse:  77  Resp:  16  Temp: (!) 35.6 C   SpO2:  95%    Last Pain:  Vitals:   08/18/23 1327  TempSrc:   PainSc: 0-No pain                 Lenard Simmer

## 2023-08-18 NOTE — Transfer of Care (Signed)
 Immediate Anesthesia Transfer of Care Note  Patient: Joel [name]  Procedure(s) Performed: COLONOSCOPY POLYPECTOMY, INTESTINE  Patient Location: PACU and Endoscopy Unit  Anesthesia Type:General  Level of Consciousness: sedated  Airway & Oxygen Therapy: Patient Spontanous Breathing  Post-op Assessment: Report given to RN and Post -op Vital signs reviewed and stable  Post vital signs: Reviewed and stable  Last Vitals:  Vitals Value Taken Time  BP 110/72 08/18/23 1308  Temp 35.6 C 08/18/23 1307  Pulse 76 08/18/23 1309  Resp 15 08/18/23 1309  SpO2 95 % 08/18/23 1309  Vitals shown include unfiled device data.  Last Pain:  Vitals:   08/18/23 1307  TempSrc: Temporal  PainSc: Asleep         Complications: No notable events documented.

## 2023-08-18 NOTE — Op Note (Signed)
 [facility] Patient Name: Joel [name] Procedure Date: 08/18/2023 12:25 PM MRN: [id number] Account #: [id number] Date of Birth: Aug 20, 1960 Admit Type: Outpatient Age: 63 Room: [facility] ENDO ROOM 3 Gender: Male Note Status: Finalized Instrument Name: Nelda Marseille [id number] Procedure:             Colonoscopy Indications:           Screening for colorectal malignant neoplasm Providers:             Eather Colas MD, MD Medicines:             Monitored Anesthesia Care Complications:         No immediate complications. Estimated blood loss:                         Minimal. Procedure:             Pre-Anesthesia Assessment:                        - Prior to the procedure, a History and Physical was                         performed, and patient medications and allergies were                         reviewed. The patient is competent. The risks and                         benefits of the procedure and the sedation options and                         risks were discussed with the patient. All questions                         were answered and informed consent was obtained.                         Patient identification and proposed procedure were                         verified by the physician, the nurse, the                         anesthesiologist, the anesthetist and the technician                         in the endoscopy suite. Mental Status Examination:                         alert and oriented. Airway Examination: normal                         oropharyngeal airway and neck mobility. Respiratory                         Examination: clear to auscultation. CV Examination:                         normal. Prophylactic Antibiotics: The patient does not  require prophylactic antibiotics. Prior                         Anticoagulants: The patient has taken no anticoagulant                         or antiplatelet agents. ASA Grade  Assessment: III - A                         patient with severe systemic disease. After reviewing                         the risks and benefits, the patient was deemed in                         satisfactory condition to undergo the procedure. The                         anesthesia plan was to use monitored anesthesia care                         (MAC). Immediately prior to administration of                         medications, the patient was re-assessed for adequacy                         to receive sedatives. The heart rate, respiratory                         rate, oxygen saturations, blood pressure, adequacy of                         pulmonary ventilation, and response to care were                         monitored throughout the procedure. The physical                         status of the patient was re-assessed after the                         procedure.                        After obtaining informed consent, the colonoscope was                         passed under direct vision. Throughout the procedure,                         the patient's blood pressure, pulse, and oxygen                         saturations were monitored continuously. The                         Colonoscope was introduced through the anus and  advanced to the the cecum, identified by appendiceal                         orifice and ileocecal valve. The colonoscopy was                         performed without difficulty. The patient tolerated                         the procedure well. The quality of the bowel                         preparation was good. The ileocecal valve, appendiceal                         orifice, and rectum were photographed. Findings:      The perianal and digital rectal examinations were normal.      A 3 mm polyp was found in the hepatic flexure. The polyp was sessile.       The polyp was removed with a cold snare. Resection and retrieval were        complete. Estimated blood loss was minimal.      Two sessile polyps were found in the transverse colon. The polyps were 2       to 4 mm in size. These polyps were removed with a cold snare. Resection       and retrieval were complete. Estimated blood loss was minimal.      A 3 mm polyp was found in the descending colon. The polyp was sessile.       The polyp was removed with a cold snare. Resection and retrieval were       complete. Estimated blood loss was minimal.      A few small-mouthed diverticula were found in the sigmoid colon.      Internal hemorrhoids were found during retroflexion. The hemorrhoids       were Grade I (internal hemorrhoids that do not prolapse).      The exam was otherwise without abnormality on direct and retroflexion       views. Impression:            - One 3 mm polyp at the hepatic flexure, removed with                         a cold snare. Resected and retrieved.                        - Two 2 to 4 mm polyps in the transverse colon,                         removed with a cold snare. Resected and retrieved.                        - One 3 mm polyp in the descending colon, removed with                         a cold snare. Resected and retrieved.                        - Diverticulosis  in the sigmoid colon.                        - Internal hemorrhoids.                        - The examination was otherwise normal on direct and                         retroflexion views. Recommendation:        - Discharge patient to home.                        - Resume previous diet.                        - Continue present medications.                        - Await pathology results.                        - Repeat colonoscopy in 3 years for surveillance.                        - Return to referring physician as previously                         scheduled. Procedure Code(s):     --- Professional ---                        [phone], Colonoscopy, flexible; with removal of                          tumor(s), polyp(s), or other lesion(s) by snare                         technique Diagnosis Code(s):     --- Professional ---                        Z12.11, Encounter for screening for malignant neoplasm                         of colon                        D12.3, Benign neoplasm of transverse colon (hepatic                         flexure or splenic flexure)                        D12.4, Benign neoplasm of descending colon                        K64.0, First degree hemorrhoids                        K57.30, Diverticulosis of large intestine without                         perforation or abscess without bleeding  CPT copyright 2022 American Medical Association. All rights reserved. The codes documented in this report are preliminary and upon coder review may  be revised to meet current compliance requirements. Eather Colas MD, MD 08/18/2023 1:07:39 PM Number of Addenda: 0 Note Initiated On: 08/18/2023 12:25 PM Scope Withdrawal Time: 0 hours 8 minutes 58 seconds  Total Procedure Duration: 0 hours 18 minutes 58 seconds  Estimated Blood Loss:  Estimated blood loss was minimal.      [facility]

## 2023-08-18 NOTE — Anesthesia Preprocedure Evaluation (Addendum)
 Anesthesia Evaluation  Patient identified by MRN, date of birth, ID band Patient awake    Reviewed: Allergy & Precautions, H&P , NPO status , Patient's Chart, lab work & pertinent test results, reviewed documented beta blocker date and time   History of Anesthesia Complications Negative for: history of anesthetic complications  Airway Mallampati: III  TM Distance: >3 FB Neck ROM: full    Dental  (+) Dental Advidsory Given, Caps   Pulmonary neg shortness of breath, COPD, neg recent URI, former smoker   Pulmonary exam normal breath sounds clear to auscultation       Cardiovascular Exercise Tolerance: Good hypertension, (-) angina (-) Past MI and (-) Cardiac Stents negative cardio ROS Normal cardiovascular exam(-) dysrhythmias (-) Valvular Problems/Murmurs Rhythm:regular Rate:Normal  ECG 07/20/23: 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; RBBB, LAFB; bifascicular block; no STEMI  Echo 10/18/22:  Normal Stress Echocardiogram  NORMAL RIGHT VENTRICULAR SYSTOLIC FUNCTION  TRIVIAL REGURGITATION NOTED  NO VALVULAR STENOSIS NOTED     Neuro/Psych  Headaches, neg Seizures PSYCHIATRIC DISORDERS (ADHD)      TIA   GI/Hepatic negative GI ROS,GERD  ,,Jaundice today   Endo/Other  negative endocrine ROSneg diabetes    Renal/GU negative Renal ROS  negative genitourinary   Musculoskeletal   Abdominal   Peds  Hematology negative hematology ROS (+)   Anesthesia Other Findings Past Medical History: No date: ADD (attention deficit disorder) No date: Carpal tunnel syndrome, bilateral No date: COPD (chronic obstructive pulmonary disease) (HCC) No date: 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 No date: GERD (gastroesophageal reflux disease) No date: Headache No date: Hyperlipidemia No date: Hypertension No date: Statin myopathy No date: Thyroid nodule   Reproductive/Obstetrics negative OB ROS                              Anesthesia Physical Anesthesia Plan  ASA: 3  Anesthesia Plan: General   Post-op Pain Management:    Induction: Intravenous  PONV Risk Score and Plan: 2 and Propofol infusion, TIVA and Treatment may vary due to age or medical condition  Airway Management Planned: Natural Airway and Nasal Cannula  Additional Equipment:   Intra-op Plan:   Post-operative Plan:   Informed Consent: I have reviewed the patients History and Physical, chart, labs and discussed the procedure including the risks, benefits and alternatives for the proposed anesthesia with the patient or authorized representative who has indicated his/her understanding and acceptance.     Dental Advisory Given  Plan Discussed with: Anesthesiologist, CRNA and Surgeon  Anesthesia Plan Comments:         Anesthesia Quick Evaluation

## 2023-08-18 NOTE — Interval H&P Note (Signed)
 History and Physical Interval Note:  08/18/2023 12:37 PM  Joel [name]  has presented today for surgery, with the diagnosis of Screening for colon cancer (Z12.11).  The various methods of treatment have been discussed with the patient and family. After consideration of risks, benefits and other options for treatment, the patient has consented to  Procedure(s): COLONOSCOPY (N/A) as a surgical intervention.  The patient's history has been reviewed, patient examined, no change in status, stable for surgery.  I have reviewed the patient's chart and labs.  Questions were answered to the patient's satisfaction.     Regis Bill  Ok to proceed with colonoscopy

## 2023-08-18 NOTE — H&P (Signed)
 Outpatient short stay form Pre-procedure 08/18/2023  Joel Bill, MD  Primary Physician: Marguarite Arbour, MD  Reason for visit:  Screening  History of present illness:    63 y/o gentleman with history of COPD, hyperlipidemia, and hypertension here for screening colonoscopy. Last colonoscopy was 15 years ago. No blood thinners. No family history of GI malignancies. No significant abdominal surgeries.    Current Facility-Administered Medications:    0.9 %  sodium chloride infusion, , Intravenous, Continuous, Franz Svec, Rossie Muskrat, MD, Last Rate: 20 mL/hr at 08/18/23 1226, New Bag at 08/18/23 1226  Medications Prior to Admission  Medication Sig Dispense Refill Last Dose/Taking   ALPRAZolam (XANAX) 1 MG tablet Take 0.5 mg by mouth daily as needed for anxiety.   08/17/2023 Evening   aspirin 81 MG chewable tablet Chew 81 mg by mouth daily.   08/18/2023   bisoprolol (ZEBETA) 5 MG tablet Take 5 mg by mouth daily.   08/17/2023   ezetimibe (ZETIA) 10 MG tablet Take 1 tablet by mouth daily.   08/17/2023   telmisartan-hydrochlorothiazide (MICARDIS HCT) 80-25 MG tablet Take 1 tablet by mouth daily. 90 tablet 0 08/18/2023   traZODone (DESYREL) 50 MG tablet Take 50 mg by mouth at bedtime.   Taking   amphetamine-dextroamphetamine (ADDERALL XR) 20 MG 24 hr capsule Take 20 mg by mouth daily.   08/16/2023   atorvastatin (LIPITOR) 40 MG tablet Take 40 mg by mouth daily. (Patient not taking: Reported on 08/18/2023)   Not Taking   buPROPion (WELLBUTRIN XL) 300 MG 24 hr tablet Take 300 mg by mouth daily.      gabapentin (NEURONTIN) 100 MG capsule Take 1 capsule (100 mg total) by mouth 3 (three) times daily. 30 capsule 0    naproxen (NAPROSYN) 500 MG tablet Take 1 tablet (500 mg total) by mouth 2 (two) times daily with a meal. 20 tablet 0    ondansetron (ZOFRAN ODT) 4 MG disintegrating tablet Take 1 tablet (4 mg total) by mouth every 8 (eight) hours as needed for nausea or vomiting. 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    Physical Exam  Gen: Alert, oriented. Appears stated age.  HEENT: PERRLA. Lungs: No respiratory distress CV: RRR Abd: soft, benign, no masses Ext: No edema    Planned procedures: Proceed with colonoscopy. The patient understands the nature of the planned procedure, indications, risks, alternatives and potential complications including but not limited to bleeding, infection, perforation, damage to internal organs and possible oversedation/side effects from anesthesia. The patient agrees and gives consent to proceed.  Please refer to procedure notes for findings, recommendations and patient disposition/instructions.     Joel Bill, MD [facility]

## 2023-08-21 ENCOUNTER — Other Ambulatory Visit: Payer: Self-pay | Admitting: Gastroenterology

## 2023-08-21 ENCOUNTER — Encounter: Payer: Self-pay | Admitting: Gastroenterology

## 2023-08-21 DIAGNOSIS — R17 Unspecified jaundice: Secondary | ICD-10-CM

## 2023-08-21 LAB — SURGICAL PATHOLOGY

## 2023-08-22 ENCOUNTER — Other Ambulatory Visit: Payer: Self-pay | Admitting: Gastroenterology

## 2023-08-22 ENCOUNTER — Ambulatory Visit
Admission: RE | Admit: 2023-08-22 | Discharge: 2023-08-22 | Disposition: A | Discharge status: Home / Self Care | Payer: Self-pay | Source: Ambulatory Visit | Attending: Gastroenterology | Admitting: Gastroenterology

## 2023-08-22 ENCOUNTER — Other Ambulatory Visit: Payer: Self-pay

## 2023-08-22 ENCOUNTER — Telehealth: Payer: Self-pay

## 2023-08-22 DIAGNOSIS — R17 Unspecified jaundice: Secondary | ICD-10-CM

## 2023-08-22 DIAGNOSIS — G5603 Carpal tunnel syndrome, bilateral upper limbs: Secondary | ICD-10-CM | POA: Insufficient documentation

## 2023-08-22 DIAGNOSIS — K838 Other specified diseases of biliary tract: Secondary | ICD-10-CM

## 2023-08-22 DIAGNOSIS — K219 Gastro-esophageal reflux disease without esophagitis: Secondary | ICD-10-CM | POA: Insufficient documentation

## 2023-08-22 DIAGNOSIS — J449 Chronic obstructive pulmonary disease, unspecified: Secondary | ICD-10-CM | POA: Insufficient documentation

## 2023-08-22 DIAGNOSIS — E785 Hyperlipidemia, unspecified: Secondary | ICD-10-CM | POA: Insufficient documentation

## 2023-08-22 MED ORDER — GADOBUTROL 1 MMOL/ML IV SOLN
10.0000 mL | Freq: Once | INTRAVENOUS | Status: AC | PRN
  Administered 2023-08-22: 10 mL via INTRAVENOUS

## 2023-08-22 NOTE — Telephone Encounter (Signed)
   Need to schedule ERCP for intrahepatic biliary dilatation   Patient has been scheduled for 08/23/2023  He was told to arrive at [facility] over medications and instructions

## 2023-08-23 ENCOUNTER — Encounter
Admission: RE | Disposition: A | Discharge status: Home / Self Care | Payer: Self-pay | Source: Home / Self Care | Attending: Gastroenterology

## 2023-08-23 ENCOUNTER — Ambulatory Visit: Payer: Self-pay | Admitting: Anesthesiology

## 2023-08-23 ENCOUNTER — Ambulatory Visit
Admission: RE | Admit: 2023-08-23 | Discharge: 2023-08-23 | Disposition: A | Discharge status: Home / Self Care | Payer: Self-pay | Attending: Gastroenterology | Admitting: Gastroenterology

## 2023-08-23 ENCOUNTER — Other Ambulatory Visit: Payer: Self-pay

## 2023-08-23 ENCOUNTER — Ambulatory Visit: Payer: Self-pay

## 2023-08-23 ENCOUNTER — Encounter: Payer: Self-pay | Admitting: Gastroenterology

## 2023-08-23 DIAGNOSIS — C240 Malignant neoplasm of extrahepatic bile duct: Secondary | ICD-10-CM

## 2023-08-23 DIAGNOSIS — J449 Chronic obstructive pulmonary disease, unspecified: Secondary | ICD-10-CM | POA: Insufficient documentation

## 2023-08-23 DIAGNOSIS — K831 Obstruction of bile duct: Secondary | ICD-10-CM

## 2023-08-23 DIAGNOSIS — K838 Other specified diseases of biliary tract: Secondary | ICD-10-CM

## 2023-08-23 DIAGNOSIS — Z8673 Personal history of transient ischemic attack (TIA), and cerebral infarction without residual deficits: Secondary | ICD-10-CM | POA: Insufficient documentation

## 2023-08-23 DIAGNOSIS — B3789 Other sites of candidiasis: Secondary | ICD-10-CM

## 2023-08-23 DIAGNOSIS — Z87891 Personal history of nicotine dependence: Secondary | ICD-10-CM | POA: Insufficient documentation

## 2023-08-23 DIAGNOSIS — F909 Attention-deficit hyperactivity disorder, unspecified type: Secondary | ICD-10-CM | POA: Insufficient documentation

## 2023-08-23 DIAGNOSIS — I10 Essential (primary) hypertension: Secondary | ICD-10-CM | POA: Insufficient documentation

## 2023-08-23 HISTORY — PX: ERCP: SHX5425

## 2023-08-23 LAB — KOH PREP

## 2023-08-23 SURGERY — ERCP, WITH INTERVENTION IF INDICATED
Anesthesia: General

## 2023-08-23 MED ORDER — DICLOFENAC SUPPOSITORY 100 MG
RECTAL | Status: AC
  Filled 2023-08-23: qty 1

## 2023-08-23 MED ORDER — PROPOFOL 1000 MG/100ML IV EMUL
INTRAVENOUS | Status: AC
  Filled 2023-08-23: qty 100

## 2023-08-23 MED ORDER — DICLOFENAC SUPPOSITORY 100 MG
100.0000 mg | Freq: Once | RECTAL | Status: AC
  Administered 2023-08-23: 100 mg via RECTAL

## 2023-08-23 MED ORDER — LIDOCAINE HCL (CARDIAC) PF 100 MG/5ML IV SOSY
PREFILLED_SYRINGE | INTRAVENOUS | Status: DC | PRN
  Administered 2023-08-23 (×2): 100 mg via INTRAVENOUS

## 2023-08-23 MED ORDER — PROPOFOL 500 MG/50ML IV EMUL
INTRAVENOUS | Status: DC | PRN
  Administered 2023-08-23: 150 ug/kg/min via INTRAVENOUS

## 2023-08-23 MED ORDER — DEXMEDETOMIDINE HCL IN NACL 80 MCG/20ML IV SOLN
INTRAVENOUS | Status: DC | PRN
  Administered 2023-08-23: 12 ug via INTRAVENOUS

## 2023-08-23 MED ORDER — SODIUM CHLORIDE 0.9 % IV SOLN: INTRAVENOUS | Status: DC

## 2023-08-23 NOTE — Transfer of Care (Signed)
 Immediate Anesthesia Transfer of Care Note  Patient: Joel [name]  Procedure(s) Performed: ERCP, WITH INTERVENTION IF INDICATED  Patient Location: PACU  Anesthesia Type:General  Level of Consciousness: drowsy  Airway & Oxygen Therapy: Patient Spontanous Breathing  Post-op Assessment: Report given to RN and Post -op Vital signs reviewed and stable  Post vital signs: stable  Last Vitals:  Vitals Value Taken Time  BP    Temp    Pulse    Resp    SpO2      Last Pain:  Vitals:   08/23/23 0957  TempSrc: Temporal  PainSc: 0-No pain         Complications: No notable events documented.

## 2023-08-23 NOTE — Anesthesia Preprocedure Evaluation (Addendum)
 Anesthesia Evaluation  Patient identified by MRN, date of birth, ID band Patient awake    Reviewed: Allergy & Precautions, H&P , NPO status , Patient's Chart, lab work & pertinent test results, reviewed documented beta blocker date and time   History of Anesthesia Complications Negative for: history of anesthetic complications  Airway Mallampati: IV  TM Distance: >3 FB Neck ROM: full    Dental  (+) Dental Advidsory Given, Caps   Pulmonary neg shortness of breath, COPD, neg recent URI, former smoker   Pulmonary exam normal breath sounds clear to auscultation       Cardiovascular Exercise Tolerance: Good hypertension, (-) angina (-) Past MI and (-) Cardiac Stents Normal cardiovascular exam(-) dysrhythmias (-) Valvular Problems/Murmurs Rhythm:regular Rate:Normal  ECG 07/20/23: 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; RBBB, LAFB; bifascicular block; no STEMI  Echo 10/18/22:  Normal Stress Echocardiogram  NORMAL RIGHT VENTRICULAR SYSTOLIC FUNCTION  TRIVIAL REGURGITATION NOTED  NO VALVULAR STENOSIS NOTED     Neuro/Psych  Headaches, neg Seizures PSYCHIATRIC DISORDERS (ADHD)      CVA (Ct scan with evidence of old stroke)    GI/Hepatic ,GERD  Medicated and Controlled,,  Endo/Other  negative endocrine ROSneg diabetes    Renal/GU negative Renal ROS  negative genitourinary   Musculoskeletal   Abdominal  (+) + obese  Peds  Hematology negative hematology ROS (+)   Anesthesia Other Findings Jaundice   Past Medical History: No date: ADD (attention deficit disorder) No date: Carpal tunnel syndrome, bilateral No date: COPD (chronic obstructive pulmonary disease) (HCC) No date: 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 No date: GERD (gastroesophageal reflux disease) No date: Headache No date: Hyperlipidemia No date: Hypertension No date: Statin myopathy No date: Thyroid nodule   Reproductive/Obstetrics negative OB ROS                              Anesthesia Physical Anesthesia Plan  ASA: 3  Anesthesia Plan: General   Post-op Pain Management: Minimal or no pain anticipated   Induction: Intravenous  PONV Risk Score and Plan: 2 and Propofol infusion, TIVA and Treatment may vary due to age or medical condition  Airway Management Planned: Natural Airway and Nasal Cannula  Additional Equipment:   Intra-op Plan:   Post-operative Plan:   Informed Consent: I have reviewed the patients History and Physical, chart, labs and discussed the procedure including the risks, benefits and alternatives for the proposed anesthesia with the patient or authorized representative who has indicated his/her understanding and acceptance.     Dental Advisory Given  Plan Discussed with: Anesthesiologist, CRNA and Surgeon  Anesthesia Plan Comments:         Anesthesia Quick Evaluation

## 2023-08-23 NOTE — Anesthesia Postprocedure Evaluation (Signed)
 Anesthesia Post Note  Patient: Joel [name]  Procedure(s) Performed: ERCP, WITH INTERVENTION IF INDICATED  Patient location during evaluation: Endoscopy Anesthesia Type: General Level of consciousness: awake and alert Pain management: pain level controlled Vital Signs Assessment: post-procedure vital signs reviewed and stable Respiratory status: spontaneous breathing, nonlabored ventilation, respiratory function stable and patient connected to nasal cannula oxygen Cardiovascular status: blood pressure returned to baseline and stable Postop Assessment: no apparent nausea or vomiting Anesthetic complications: no   There were no known notable events for this encounter.   Last Vitals:  Vitals:   08/23/23 1237 08/23/23 1255  BP: (!) 139/90 (!) 137/91  Pulse: 79 68  Resp: (!) 21 20  Temp:    SpO2: 98% 100%    Last Pain:  Vitals:   08/23/23 1255  TempSrc:   PainSc: 0-No pain                 Lattie Poli

## 2023-08-23 NOTE — Op Note (Addendum)
 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 Claudius Cumins, MD (Referring MD) Medicines:             Propofol  per Anesthesia Complications:         No immediate complications. Procedure:             Pre-Anesthesia Assessment:                        - Prior to the procedure, a History and Physical was                         performed, and patient medications and allergies were                         reviewed. The patient's tolerance of previous                         anesthesia was also reviewed. The risks and benefits                         of the procedure and the sedation options and risks                         were discussed with the patient. All questions were                         answered, and informed consent was obtained. Prior                         Anticoagulants: The patient has taken no anticoagulant                         or antiplatelet agents. ASA Grade Assessment: II - A                         patient with mild systemic disease. After reviewing                         the risks and benefits, the patient was deemed in                         satisfactory condition to undergo the procedure.                        After obtaining informed consent, the scope was passed  under direct vision. Throughout the procedure, the                         patient's blood pressure, pulse, and oxygen                         saturations were monitored  continuously. The                         Duodenoscope was introduced through the mouth, and                         used to inject contrast into and used to cannulate the                         bile duct. The ERCP was accomplished without                         difficulty. The patient tolerated the procedure well. Findings:      The scout film was normal. The esophagus was successfully intubated       under direct vision. The scope was advanced to a normal major papilla in       the descending duodenum without detailed examination of the pharynx,       larynx and associated structures, and upper GI tract. The upper GI tract       was grossly normal. The bile duct was deeply cannulated with the       short-nosed traction sphincterotome. Contrast was injected. I personally       interpreted the bile duct images. There was brisk flow of contrast       through the ducts. Image quality was excellent. Contrast extended to the       entire biliary tree. The hepatic duct bifurcation contained a single       segmental stenosis. The left and right hepatic ducts separately (Bismuth       II) and entire biliary tree were diffusely dilated. A wire was passed       into the biliary tree. A 7 mm biliary sphincterotomy was made with a       traction (standard) sphincterotome using ERBE electrocautery. There was       no post-sphincterotomy bleeding. One 7 Fr by 12 cm plastic stent with a       single external flap and a single internal flap was placed 10 cm into       the right hepatic duct. Bile flowed through the stent. The stent was in       good position. Cells for cytology were obtained by brushing in the       hepatic duct bifurcation. Impression:            - A single segmental biliary stricture was found. The                         stricture was malignant appearing.                        - The hepatic duct system (Bismuth II) and entire                         biliary tree  were dilated.                         - A biliary sphincterotomy was performed.                        - One plastic stent was placed into the right hepatic                         duct.                        - Cells for cytology obtained at the hepatic duct                         bifurcation. Recommendation:        - Watch for pancreatitis, bleeding, perforation, and                         cholangitis.                        - Discharge patient to home.                        - Clear liquid diet today.                        - Await cytology results.                        - Return to this GI lab for stent exchange at ERCP in                         3 months. Procedure Code(s):     --- Professional ---                        [phone], Endoscopic retrograde cholangiopancreatography                         (ERCP); with placement of endoscopic stent into                         biliary or pancreatic duct, including pre- and                         post-dilation and guide wire passage, when performed,                         including sphincterotomy, when performed, each stent                        [id number], 59, Endoscopic retrograde                         cholangiopancreatography (ERCP); with                         sphincterotomy/papillotomy                        [id number], Endoscopic catheterization of the biliary  ductal system, radiological supervision and                         interpretation Diagnosis Code(s):     --- Professional ---                        K83.1, Obstruction of bile duct                        K83.8, Other specified diseases of biliary tract CPT copyright 2022 American Medical Association. All rights reserved. The codes documented in this report are preliminary and upon coder review may  be revised to meet current compliance requirements. Marnee Sink MD, MD 08/23/2023 12:16:54 PM This report has been signed electronically. Number of Addenda: 0 Note Initiated On:  08/23/2023 10:52 AM Estimated Blood Loss:  Estimated blood loss: none. Estimated blood loss: none.      [facility]

## 2023-08-23 NOTE — H&P (Signed)
 Midge Minium, MD [facility] [address], [address] [place], [place] [address] [phone] Fax : [phone]  Primary Care Physician:  Marguarite Arbour, MD Primary Gastroenterologist:  Dr. Servando Snare  Pre-Procedure History & Physical: HPI:  Joel [name] is a 63 y.o. male is here for an ERCP.   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    GERD (gastroesophageal reflux disease)    Headache    Hyperlipidemia    Hypertension    Statin myopathy    Stroke [facility])    Thyroid nodule     Past Surgical History:  Procedure Laterality Date   COLONOSCOPY N/A 08/18/2023   Procedure: COLONOSCOPY;  Surgeon: Regis Bill, MD;  Location: [facility] ENDOSCOPY;  Service: Endoscopy;  Laterality: N/A;   MOHS SURGERY     2025, left cheek   POLYPECTOMY  08/18/2023   Procedure: POLYPECTOMY, INTESTINE;  Surgeon: Regis Bill, MD;  Location: [facility] ENDOSCOPY;  Service: Endoscopy;;   TONSILLECTOMY     1968    Prior to Admission medications   Medication Sig Start Date End Date Taking? Authorizing Provider  ALPRAZolam Prudy Feeler) 1 MG tablet Take 0.5 mg by mouth daily as needed for anxiety.   Yes [provider]  amphetamine-dextroamphetamine (ADDERALL XR) 20 MG 24 hr capsule Take 20 mg by mouth daily.   Yes [provider]  aspirin 81 MG chewable tablet Chew 81 mg by mouth daily.   Yes [provider]  buPROPion (WELLBUTRIN XL) 300 MG 24 hr tablet Take 300 mg by mouth daily. 04/14/23  Yes [provider]  ezetimibe (ZETIA) 10 MG tablet Take 1 tablet by mouth daily. 07/05/23 07/04/24 Yes [provider]  naproxen (NAPROSYN) 500 MG tablet Take 1 tablet (500 mg total) by mouth 2 (two) times daily with a meal. 05/31/17  Yes Sharman Cheek, MD  ondansetron (ZOFRAN ODT) 4 MG disintegrating tablet  Take 1 tablet (4 mg total) by mouth every 8 (eight) hours as needed for nausea or vomiting. 05/31/17  Yes Sharman Cheek, MD  telmisartan-hydrochlorothiazide (MICARDIS HCT) 80-25 MG tablet Take 1 tablet by mouth daily. 05/31/17  Yes Sharman Cheek, MD  traZODone (DESYREL) 50 MG tablet Take 50 mg by mouth at bedtime.   Yes [provider]  atorvastatin (LIPITOR) 40 MG tablet Take 40 mg by mouth daily. Patient not taking: Reported on 08/23/2023    [provider]  gabapentin (NEURONTIN) 100 MG capsule Take 1 capsule (100 mg total) by mouth 3 (three) times daily. 04/02/22 04/02/23  Loleta Rose, MD    Allergies as of 08/22/2023 - Review Complete 08/18/2023  Allergen Reaction Noted   Hydrocodone-acetaminophen Other (See Comments) 12/02/2013   Penicillins  01/28/2017    Family History  Problem Relation Age of Onset   Cancer Sister     Social History   Socioeconomic History   Marital status: Divorced    Spouse name: Not on file   Number of children: Not on file   Years of education: Not on file   Highest education level: Not on file  Occupational History   Not on file  Tobacco Use   Smoking status: Former   Smokeless tobacco: Never  Vaping Use   Vaping status: Never Used  Substance and Sexual Activity   Alcohol use: Yes    Comment:  occasional   Drug use: No   Sexual activity: Not on file  Other Topics Concern   Not on file  Social History Narrative   Not on file   Social Drivers of Health   Financial Resource Strain: Low Risk  (07/05/2023)   Received from [facility]   Overall Financial Resource Strain (CARDIA)    Difficulty of Paying Living Expenses: Not hard at all  Food Insecurity: No Food Insecurity (07/05/2023)   Received from [facility]   Hunger Vital Sign    Worried About Running Out of Food in the Last Year: Never true    Ran Out of Food in the Last Year: Never true  Transportation Needs: No  Transportation Needs (07/05/2023)   Received from [facility] - Transportation    In the past 12 months, has lack of transportation kept you from medical appointments or from getting medications?: No    Lack of Transportation (Non-Medical): No  Physical Activity: Not on file  Stress: Not on file  Social Connections: Not on file  Intimate Partner Violence: Not on file    Review of Systems: See HPI, otherwise negative ROS  Physical Exam: BP (!) 137/91   Pulse 68   Temp (!) 97.1 F (36.2 C) (Temporal)   Resp 20   Ht 5' 6.5" (1.689 m)   Wt 99.7 kg   SpO2 100%   BMI 34.95 kg/m  General:   Alert,  pleasant and cooperative in NAD Head:  Normocephalic and atraumatic. Neck:  Supple; no masses or thyromegaly. Lungs:  Clear throughout to auscultation.    Heart:  Regular rate and rhythm. Abdomen:  Soft, nontender and nondistended. Normal bowel sounds, without guarding, and without rebound.   Neurologic:  Alert and  oriented x4;  grossly normal neurologically.  Impression/Plan: Joel [name] is here for an ERCP to be performed for bile duct obstruction   Risks, benefits, limitations, and alternatives regarding  ERCP have been reviewed with the patient.  Questions have been answered.  All parties agreeable.   Marnee Sink, MD  08/23/2023, 1:41 PM

## 2023-08-24 ENCOUNTER — Encounter: Payer: Self-pay | Admitting: Gastroenterology

## 2023-08-24 ENCOUNTER — Telehealth: Payer: Self-pay

## 2023-08-24 NOTE — Telephone Encounter (Signed)
 Schedule ERCP 3 months for stent removal

## 2023-08-25 LAB — CYTOLOGY - NON PAP

## 2023-08-28 ENCOUNTER — Other Ambulatory Visit: Payer: Self-pay

## 2023-08-28 DIAGNOSIS — K838 Other specified diseases of biliary tract: Secondary | ICD-10-CM

## 2023-08-28 NOTE — Telephone Encounter (Signed)
 Pt has been schedule for 3 month ERCP for stent removal

## 2023-08-30 ENCOUNTER — Telehealth: Payer: Self-pay | Admitting: Gastroenterology

## 2023-08-30 NOTE — Telephone Encounter (Signed)
 Call made to patient to clarify plans for oncology treatment. Direct referral received from Dr. Emerick Hanlon to consult with Dr. Randy Buttery here at the cancer center. However, patient is already scheduled with [facility] on Friday April 28 at 2:45pm.   Message left with my direct extension so that patient can confirm where he would like to be seen.

## 2023-09-20 ENCOUNTER — Inpatient Hospital Stay: Payer: MEDICAID | Attending: Oncology | Admitting: Oncology

## 2023-09-20 ENCOUNTER — Encounter: Payer: Self-pay | Admitting: Oncology

## 2023-09-20 ENCOUNTER — Inpatient Hospital Stay: Payer: MEDICAID

## 2023-09-20 VITALS — BP 113/70 | HR 92 | Temp 97.6°F | Resp 18 | Wt 216.0 lb

## 2023-09-20 DIAGNOSIS — Z5111 Encounter for antineoplastic chemotherapy: Secondary | ICD-10-CM | POA: Insufficient documentation

## 2023-09-20 DIAGNOSIS — Z5189 Encounter for other specified aftercare: Secondary | ICD-10-CM | POA: Insufficient documentation

## 2023-09-20 DIAGNOSIS — I10 Essential (primary) hypertension: Secondary | ICD-10-CM | POA: Insufficient documentation

## 2023-09-20 DIAGNOSIS — C240 Malignant neoplasm of extrahepatic bile duct: Secondary | ICD-10-CM

## 2023-09-20 DIAGNOSIS — Z5112 Encounter for antineoplastic immunotherapy: Secondary | ICD-10-CM | POA: Insufficient documentation

## 2023-09-20 DIAGNOSIS — C83398 Diffuse large b-cell lymphoma of other extranodal and solid organ sites: Secondary | ICD-10-CM | POA: Insufficient documentation

## 2023-09-20 DIAGNOSIS — Z79620 Long term (current) use of immunosuppressive biologic: Secondary | ICD-10-CM | POA: Insufficient documentation

## 2023-09-20 DIAGNOSIS — J449 Chronic obstructive pulmonary disease, unspecified: Secondary | ICD-10-CM | POA: Insufficient documentation

## 2023-09-20 DIAGNOSIS — E785 Hyperlipidemia, unspecified: Secondary | ICD-10-CM | POA: Insufficient documentation

## 2023-09-20 DIAGNOSIS — Z7189 Other specified counseling: Secondary | ICD-10-CM

## 2023-09-20 LAB — URIC ACID: Uric Acid, Serum: 6.1 mg/dL (ref 3.7–8.6)

## 2023-09-20 LAB — CBC WITH DIFFERENTIAL (CANCER CENTER ONLY)
Abs Immature Granulocytes: 0.12 10*3/uL — ABNORMAL HIGH (ref 0.00–0.07)
Basophils Absolute: 0.1 10*3/uL (ref 0.0–0.1)
Basophils Relative: 1 %
Eosinophils Absolute: 0.2 10*3/uL (ref 0.0–0.5)
Eosinophils Relative: 2 %
HCT: 38.6 % — ABNORMAL LOW (ref 39.0–52.0)
Hemoglobin: 13.6 g/dL (ref 13.0–17.0)
Immature Granulocytes: 1 %
Lymphocytes Relative: 11 %
Lymphs Abs: 1.5 10*3/uL (ref 0.7–4.0)
MCH: 30.6 pg (ref 26.0–34.0)
MCHC: 35.2 g/dL (ref 30.0–36.0)
MCV: 86.9 fL (ref 80.0–100.0)
Monocytes Absolute: 1.2 10*3/uL — ABNORMAL HIGH (ref 0.1–1.0)
Monocytes Relative: 9 %
Neutro Abs: 10.5 10*3/uL — ABNORMAL HIGH (ref 1.7–7.7)
Neutrophils Relative %: 76 %
Platelet Count: 296 10*3/uL (ref 150–400)
RBC: 4.44 MIL/uL (ref 4.22–5.81)
RDW: 14.4 % (ref 11.5–15.5)
WBC Count: 13.6 10*3/uL — ABNORMAL HIGH (ref 4.0–10.5)
nRBC: 0 % (ref 0.0–0.2)

## 2023-09-20 LAB — CMP (CANCER CENTER ONLY)
ALT: 59 U/L — ABNORMAL HIGH (ref 0–44)
AST: 52 U/L — ABNORMAL HIGH (ref 15–41)
Albumin: 3.5 g/dL (ref 3.5–5.0)
Alkaline Phosphatase: 193 U/L — ABNORMAL HIGH (ref 38–126)
Anion gap: 11 (ref 5–15)
BUN: 20 mg/dL (ref 8–23)
CO2: 24 mmol/L (ref 22–32)
Calcium: 9 mg/dL (ref 8.9–10.3)
Chloride: 98 mmol/L (ref 98–111)
Creatinine: 0.99 mg/dL (ref 0.61–1.24)
GFR, Estimated: >60 mL/min (ref >60)
Glucose, Bld: 131 mg/dL — ABNORMAL HIGH (ref 70–99)
Potassium: 3 mmol/L — ABNORMAL LOW (ref 3.5–5.1)
Sodium: 133 mmol/L — ABNORMAL LOW (ref 135–145)
Total Bilirubin: 2.3 mg/dL — ABNORMAL HIGH (ref 0.0–1.2)
Total Protein: 6.9 g/dL (ref 6.5–8.1)

## 2023-09-20 LAB — PHOSPHORUS: Phosphorus: 2.7 mg/dL (ref 2.5–4.6)

## 2023-09-20 LAB — HEPATITIS B SURFACE ANTIGEN: Hepatitis B Surface Ag: NONREACTIVE

## 2023-09-20 MED ORDER — LIDOCAINE-PRILOCAINE 2.5-2.5 % EX CREA: TOPICAL_CREAM | CUTANEOUS | 3 refills | Status: AC

## 2023-09-20 MED ORDER — ALLOPURINOL 300 MG PO TABS: 300.0000 mg | ORAL_TABLET | Freq: Every day | ORAL | 3 refills | Status: DC

## 2023-09-20 MED ORDER — PROCHLORPERAZINE MALEATE 10 MG PO TABS: 10.0000 mg | ORAL_TABLET | Freq: Four times a day (QID) | ORAL | 6 refills | Status: AC | PRN

## 2023-09-20 MED ORDER — ONDANSETRON HCL 8 MG PO TABS: 8.0000 mg | ORAL_TABLET | Freq: Three times a day (TID) | ORAL | 1 refills | Status: AC | PRN

## 2023-09-20 MED ORDER — PREDNISONE 50 MG PO TABS: 100.0000 mg | ORAL_TABLET | Freq: Every day | ORAL | 5 refills | Status: DC

## 2023-09-20 MED ORDER — ACYCLOVIR 400 MG PO TABS: 400.0000 mg | ORAL_TABLET | Freq: Every day | ORAL | 3 refills | Status: DC

## 2023-09-20 NOTE — Progress Notes (Unsigned)
 Patient is here today to get a second well really a 1 st opinion on his type of cancer. He has had a lot of testing done at [facility] recently, like CT scans and biopsies. They told him that he had cancer but never had anyone explain the type and what could be done about the cancer.

## 2023-09-20 NOTE — Progress Notes (Signed)
START ON PATHWAY REGIMEN - Lymphoma and CLL     A cycle is every 21 days:     Prednisone      Rituximab-xxxx      Cyclophosphamide      Doxorubicin      Vincristine   **Always confirm dose/schedule in your pharmacy ordering system**  Patient Characteristics: Diffuse Large B-Cell Lymphoma or Follicular Lymphoma, Grade 3B, First Line, Stage III and IV Disease Type: Not Applicable Disease Type: Diffuse Large B-Cell Lymphoma Disease Type: Not Applicable Line of therapy: First Line Intent of Therapy: Curative Intent, Discussed with Patient 

## 2023-09-21 ENCOUNTER — Other Ambulatory Visit: Payer: Self-pay

## 2023-09-21 ENCOUNTER — Encounter: Payer: Self-pay | Admitting: Oncology

## 2023-09-21 DIAGNOSIS — C83398 Diffuse large b-cell lymphoma of other extranodal and solid organ sites: Secondary | ICD-10-CM

## 2023-09-21 LAB — HEPATITIS B CORE ANTIBODY, TOTAL: HEP B CORE AB: NEGATIVE

## 2023-09-21 LAB — HIV ANTIBODY (ROUTINE TESTING W REFLEX): HIV Screen 4th Generation wRfx: NONREACTIVE

## 2023-09-21 NOTE — Progress Notes (Signed)
 Hematology/Oncology Consult note [facility] [phone] [phone]  Patient Care Team: Yehuda Helms, MD as PCP - General (Internal Medicine) Avonne Boettcher, MD as Consulting Physician (Oncology)   Name of the patient: Joel [name]  [id number]  Mar 15, 1961    Reason for referral-new diagnosis of diffuse large B-cell lymphoma   Referring physician- Dr.Mettu  Date of visit: 09/21/23   History of presenting illness- Patient is a 63 year old male who underwent screening colonoscopy on 08/18/2023.  On the day of his colonoscopy patient noticed that he was jaundiced and therefore underwent labs which showed bilirubin of 14 on his CMP AST ALT of 70 and 108 and alkaline phosphatase of 246 respectively.  This was followed by MRI abdomen with MRCP with and without contrast which showed a large complex partially necrotic mass involving the central aspect of the liver.  Mass is involving segment 4A, segment 8 and the caudate lobe.  Also surrounds the gallbladder and is obstructing the biliary tree centrally near the confluence of the right and left hepatic ducts.  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  Compression and possible obstruction of the left portal vein.  The main portal vein is patent and the middle and right portal veins are patent.  There is tumor adjacent to and compressing the intrahepatic IVC but no evidence of occlusion.  Gastrohepatic ligament and celiac axis and periportal nodes concerning for metastatic disease.  Patient underwent ERCP by Dr. Danise Durie on 08/23/2023 which showed a segmental biliary stricture that was malignant appearing and a plastic stent was placed.  Cells for cytology were obtained which was consistent with atypical glandular cells but not diagnostic for malignancy.  Patient was seen by Dr. Wheeler Hammonds from pancreaticobiliary surgery at [facility] and was not deemed to be a surgical candidate.  He was then seen by Dr.  Rogue Clear from medical oncology who discussed the possibility that this could be locally advanced cholangiocarcinoma and discussed possible treatment options for the same.  However since patient did not have definitive tissue diagnosis liver biopsy was recommended.  He had a port placed in the meanwhile.  Liver biopsy surprisingly did not show any evidence of cholangiocarcinoma but was consistent with diffuse large B-cell lymphoma.  Diffuse large B cell lymphoma (DLBCL), germinal center B-cell like (GCB) subtype. See comment.   Comment: Histologic sections show needle core shaped tissue with diffuse infiltrate of large atypical lymphoid cells.  No normal liver parenchyma is identified.  The immunophenotype of the large atypical lymphoid cells are consistent with DLBCL GCB subtype per Alphonso Jean algorithm.  Staining for MYC and EBER was negative.  ECOG PS- 1  Pain scale- 0   Review of systems- Review of Systems  Constitutional:  Positive for malaise/fatigue. Negative for chills, fever and weight loss.  HENT:  Negative for congestion, ear discharge and nosebleeds.   Eyes:  Negative for blurred vision.  Respiratory:  Negative for cough, hemoptysis, sputum production, shortness of breath and wheezing.   Cardiovascular:  Negative for chest pain, palpitations, orthopnea and claudication.  Gastrointestinal:  Negative for abdominal pain, blood in stool, constipation, diarrhea, heartburn, melena, nausea and vomiting.  Genitourinary:  Negative for dysuria, flank pain, frequency, hematuria and urgency.  Musculoskeletal:  Negative for back pain, joint pain and myalgias.  Skin:  Negative for rash.  Neurological:  Negative for dizziness, tingling, focal weakness, seizures, weakness and headaches.  Endo/Heme/Allergies:  Does not bruise/bleed easily.  Psychiatric/Behavioral:  Negative for depression and suicidal ideas. The patient does  not have insomnia.     Allergies  Allergen Reactions    Hydrocodone-Acetaminophen  Other (See Comments)   Penicillins     Childhood allergy    Patient Active Problem List   Diagnosis Date Noted   Diffuse large B-cell lymphoma of solid organ excluding spleen 09/20/2023   COPD (chronic obstructive pulmonary disease) (HCC) 08/22/2023   Carpal tunnel syndrome, bilateral 08/22/2023   GERD (gastroesophageal reflux disease) 08/22/2023   Hyperlipidemia 08/22/2023   Thyroid  nodule 07/05/2023   Essential (primary) hypertension 04/25/2022   ADD (attention deficit disorder) 01/07/2015     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    GERD (gastroesophageal reflux disease)    Headache    Hyperlipidemia    Hypertension    Statin myopathy    Stroke [facility])    Thyroid  nodule      Past Surgical History:  Procedure Laterality Date   COLONOSCOPY N/A 08/18/2023   Procedure: COLONOSCOPY;  Surgeon: Shane Darling, MD;  Location: [facility] ENDOSCOPY;  Service: Endoscopy;  Laterality: N/A;   ERCP N/A 08/23/2023   Procedure: ERCP, WITH INTERVENTION IF INDICATED;  Surgeon: Marnee Sink, MD;  Location: [facility] ENDOSCOPY;  Service: Endoscopy;  Laterality: N/A;   MOHS SURGERY     2025, left cheek   POLYPECTOMY  08/18/2023   Procedure: POLYPECTOMY, INTESTINE;  Surgeon: Shane Darling, MD;  Location: [facility] ENDOSCOPY;  Service: Endoscopy;;   TONSILLECTOMY     1968    Social History   Socioeconomic History   Marital status: Divorced    Spouse name: Not on file   Number of children: Not on file   Years of education: Not on file   Highest education level: Not on file  Occupational History   Not on file  Tobacco Use   Smoking status: Former   Smokeless tobacco: Never  Vaping Use   Vaping status: Never Used  Substance and Sexual Activity   Alcohol use: Yes    Comment: occasional   Drug  use: No   Sexual activity: Not on file  Other Topics Concern   Not on file  Social History Narrative   Not on file   Social Drivers of Health   Financial Resource Strain: Low Risk  (07/05/2023)   Received from [facility]   Overall Financial Resource Strain (CARDIA)    Difficulty of Paying Living Expenses: Not hard at all  Food Insecurity: No Food Insecurity (09/20/2023)   Hunger Vital Sign    Worried About Running Out of Food in the Last Year: Never true    Ran Out of Food in the Last Year: Never true  Transportation Needs: No Transportation Needs (07/05/2023)   Received from [facility]   PRAPARE - Transportation    In the past 12 months, has lack of transportation kept you from medical appointments or from getting medications?: No    Lack of Transportation (Non-Medical): No  Physical Activity: Not on file  Stress: Not on file  Social Connections: Not on file  Intimate Partner Violence: Not At Risk (09/20/2023)   Humiliation, Afraid, Rape, and Kick questionnaire    Fear of Current or Ex-Partner: No    Emotionally Abused: No    Physically Abused: No    Sexually Abused: No     Family History  Problem Relation Age of Onset   Cancer Sister    Cancer Brother      Current Outpatient Medications:    allopurinol (ZYLOPRIM) 300 MG tablet, Take 1 tablet (300 mg total) by mouth daily., Disp: 30 tablet, Rfl: 3   ALPRAZolam (XANAX) 1 MG tablet, Take 0.5 mg by mouth daily as needed for anxiety., Disp: , Rfl:    amphetamine-dextroamphetamine (ADDERALL XR) 20 MG 24 hr capsule, Take 20 mg by mouth daily., Disp: , Rfl:    aspirin 81 MG chewable tablet, Chew 81 mg by mouth daily., Disp: , Rfl:    buPROPion (WELLBUTRIN XL) 300 MG 24 hr tablet, Take 300 mg by mouth daily., Disp: , Rfl:    ezetimibe (ZETIA) 10 MG tablet, Take 1 tablet by mouth daily., Disp: , Rfl:    naproxen  (NAPROSYN ) 500 MG tablet, Take 1 tablet (500 mg total) by mouth 2 (two) times daily  with a meal., Disp: 20 tablet, Rfl: 0   ondansetron  (ZOFRAN  ODT) 4 MG disintegrating tablet, Take 1 tablet (4 mg total) by mouth every 8 (eight) hours as needed for nausea or vomiting., Disp: 20 tablet, Rfl: 0   predniSONE (DELTASONE) 50 MG tablet, Take 2 tablets (100 mg total) by mouth daily with breakfast for 5 days., Disp: 10 tablet, Rfl: 5   telmisartan -hydrochlorothiazide  (MICARDIS  HCT) 80-25 MG tablet, Take 1 tablet by mouth daily., Disp: 90 tablet, Rfl: 0   traZODone (DESYREL) 50 MG tablet, Take 50 mg by mouth at bedtime., Disp: , Rfl:    acyclovir (ZOVIRAX) 400 MG tablet, Take 1 tablet (400 mg total) by mouth daily., Disp: 30 tablet, Rfl: 3   atorvastatin (LIPITOR) 40 MG tablet, Take 40 mg by mouth daily. (Patient not taking: Reported on 08/23/2023), Disp: , Rfl:    gabapentin  (NEURONTIN ) 100 MG capsule, Take 1 capsule (100 mg total) by mouth 3 (three) times daily. (Patient not taking: Reported on 09/20/2023), Disp: 30 capsule, Rfl: 0   lidocaine -prilocaine (EMLA) cream, Apply to affected area once, Disp: 30 g, Rfl: 3   ondansetron  (ZOFRAN ) 8 MG tablet, Take 1 tablet (8 mg total) by mouth every 8 (eight) hours as needed for nausea or vomiting. Start on the third day after cyclophosphamide chemotherapy., Disp: 30 tablet, Rfl: 1   prochlorperazine (COMPAZINE) 10 MG tablet, Take 1 tablet (10 mg total) by mouth every 6 (six) hours as needed for nausea or vomiting., Disp: 30 tablet, Rfl: 6   Physical exam:  Vitals:   09/20/23 1354  BP: 113/70  Pulse: 92  Resp: 18  Temp: 97.6 F (36.4 C)  TempSrc: Tympanic  SpO2: 98%  Weight: 216 lb (98 kg)   Physical Exam Cardiovascular:     Rate and Rhythm: Normal rate and regular rhythm.     Heart sounds: Normal heart sounds.  Pulmonary:     Effort: Pulmonary effort is normal.     Breath sounds: Normal breath sounds.  Abdominal:     General: Bowel sounds are normal.     Palpations: Abdomen is soft.     Comments: No palpable hepatosplenomegaly   Lymphadenopathy:     Comments: No palpable cervical, supraclavicular, axillary or inguinal adenopathy    Skin:    General: Skin is warm and dry.  Neurological:     Mental Status: He is alert and oriented to person, place, and time.           Latest Ref Rng & Units 09/20/2023    3:11 PM  CMP  Glucose 70 - 99 mg/dL 132   BUN 8 - 23 mg/dL 20   Creatinine 4.40 - 1.24 mg/dL 1.02   Sodium 725 - 366 mmol/L 133   Potassium 3.5 - 5.1 mmol/L 3.0   Chloride 98 - 111 mmol/L 98   CO2 22 - 32 mmol/L 24   Calcium 8.9 - 10.3 mg/dL 9.0   Total Protein 6.5 - 8.1 g/dL 6.9   Total Bilirubin 0.0 - 1.2 mg/dL 2.3   Alkaline Phos 38 - 126 U/L 193   AST 15 - 41 U/L 52   ALT 0 - 44 U/L 59       Latest Ref Rng & Units 09/20/2023    3:10 PM  CBC  WBC 4.0 - 10.5 K/uL 13.6   Hemoglobin 13.0 - 17.0 g/dL 44.0   Hematocrit 34.7 - 52.0 % 38.6   Platelets 150 - 400 K/uL 296       DG C-Arm 1-60 Min-No Report Result Date: 08/23/2023 Fluoroscopy was utilized by the requesting physician.  No radiographic interpretation.   DG C-Arm 1-60 Min-No Report Result Date: 08/23/2023 Fluoroscopy was utilized by the requesting physician.  No radiographic interpretation.    Assessment and plan- Patient is a 63 y.o. 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  MRI findings showed multifocal liver masses that appear to be cholangiocarcinoma.  However biopsy was consistent with diffuse large B-cell lymphoma germinal cell subtype.  Staining for MYC was negative and therefore patient does not have double hit lymphoma.  Given that he has diffuse liver involvement this would constitute stage IV disease.  I would like to get a PET scan to complete his staging workup.   Patient does not require bone marrow biopsy as it would not change management.  For germinal center subtype nondouble hit diffuse large B-cell lymphoma both R-CHOP and or [facility] are reasonable treatment options.  In the [facility] trial- R [facility] offered a better progression free survival of 77% versus 70% with R-CHOP chemotherapy for high risk diffuse large B-cell lymphoma.  I will be checking baseline tumor lysis syndrome labs including LDH today.  He at least has a high intermediate score of 3 given that he has age greater than 60, stage IV disease and extranodal site involvement more than 1 given both liver and gallbladder involvement.  Initially I was planning to offer monopolar CHP chemotherapy but given that his bilirubin is still 2.5 based on today's labs given the diffuse liver involvement R-CHOP may be a safer option for him.  Moreover the real benefit of our [facility] chemotherapy was seen in the non-GCB subtype.  I will therefore proceed with R-CHOP chemotherapy at this time.  This would be given IV every 3 weeks for 6 cycles.  Discussed acetaminophens of chemotherapy including all but not limited to nausea vomiting low blood counts risk of infections and hospitalization.  Risk of peripheral neuropathy associated with vincristine and cardiotoxicity associated with anthracycline.  I will obtain baseline echocardiogram or MUGA scan at this time.  Patient already has a port placement and I will plan to start chemotherapy by next Friday.  Patient can start taking prednisone 100 mg daily for 5 days a day after he gets his PET scan.  I am also  starting him on allopurinol for tumor lysis syndrome prophylaxis.  I have emphasized the importance of continued oral hydration to reduce the risk of tumor lysis syndrome.  We will monitor him closely during his first 2 cycles of treatment.  Treatment will be given with a curative intent.   An international, double-blind, placebo-controlled phase 3 trial (POLARIX)  reported that R-pola-CHP achieved superior outcomes compared with R-CHOP for adults with newly-diagnosed, intermediate- or high-risk DLBCL; one-third of the 879 patients had activated B cell (ABC)-like DLBCL and nearly two-thirds had a baseline International Prognosis Index (IPI) score of 3 to 5. Overall survival was 89 percent for both groups, but compared with R-CHOP, R-pola-CHP achieved superior progression-free survival (PFS; ie, no progression, relapse, or death) and EFS (ie, PFS plus biopsy-confirmed post-treatment residual disease and/or subsequent lymphoma therapy). R-pola-CHP was associated with 77 percent two-year PFS, compared with 70 percent with R-CHOP (hazard ratio [HR] 0.73 [95% CI 0.57-0.95]); the HR for [facility] was 0.75 (95% CI 0.58-0.96).   Cancer Staging  Diffuse large B-cell lymphoma of solid organ excluding spleen Staging form: Hodgkin and Non-Hodgkin Lymphoma, AJCC 8th Edition - Clinical stage from 09/20/2023: Stage IV (Diffuse large B-cell lymphoma) - Signed by Avonne Boettcher, MD on 09/20/2023 Stage prefix: Initial diagnosis     Thank you for this kind referral and the opportunity to participate in the care of this patient   Visit Diagnosis 1. Diffuse large B-cell lymphoma of solid organ excluding spleen   2. Goals of care, counseling/discussion     Dr. Seretha Dance, MD, MPH [facility] [phone] 09/21/2023

## 2023-09-21 NOTE — Progress Notes (Addendum)
 Switched ruxience to brand rituxan and udenyca to stimufend  for patient assistance  The following biosimilar Stimufend ([id number]-[id number]) has been selected for use in this patient.

## 2023-09-21 NOTE — Progress Notes (Signed)
 Pharmacist Chemotherapy Monitoring - Initial Assessment    Anticipated start date: 09/29/23   The following has been reviewed per standard work regarding the patient's treatment regimen: The patient's diagnosis, treatment plan and drug doses, and organ/hematologic function Lab orders and baseline tests specific to treatment regimen  The treatment plan start date, drug sequencing, and pre-medications Prior authorization status  Patient's documented medication list, including drug-drug interaction screen and prescriptions for anti-emetics and supportive care specific to the treatment regimen The drug concentrations, fluid compatibility, administration routes, and timing of the medications to be used The patient's access for treatment and lifetime cumulative dose history, if applicable  The patient's medication allergies and previous infusion related reactions, if applicable   Changes made to treatment plan:  N/A  Follow up needed:  Port placed 09/05/23 at [facility] (in Care Everywhere; other results tab): "A radiograph confirmed appropriate location of the catheter tip at the cavoatrial junction / right atrium."  MUGA sched for 09/27/23 - F/U EF   Need inj appt for Stimufend inj on 5/25   Tbili = 2.3 on 09/20/23; sent MD inbasket msg re: dose reduce Doxorubicin & Vincristine?   F/U tol of Rituxan for consideration of rapid, if appropriate     Elvira Hammersmith, Pharm.D., CPP 09/21/2023@4 :04 PM

## 2023-09-25 ENCOUNTER — Encounter: Payer: Self-pay | Admitting: Oncology

## 2023-09-25 ENCOUNTER — Encounter
Admission: RE | Admit: 2023-09-25 | Discharge: 2023-09-25 | Disposition: A | Discharge status: Home / Self Care | Payer: MEDICAID | Source: Ambulatory Visit | Attending: Oncology | Admitting: Oncology

## 2023-09-25 DIAGNOSIS — C83398 Diffuse large b-cell lymphoma of other extranodal and solid organ sites: Secondary | ICD-10-CM | POA: Insufficient documentation

## 2023-09-25 LAB — GLUCOSE, CAPILLARY: Glucose-Capillary: 92 mg/dL (ref 70–99)

## 2023-09-25 MED ORDER — FLUDEOXYGLUCOSE F - 18 (FDG) INJECTION
11.2000 | Freq: Once | INTRAVENOUS | Status: AC | PRN
  Administered 2023-09-25: 11.93 via INTRAVENOUS

## 2023-09-25 NOTE — Telephone Encounter (Signed)
I will call him today or tomorrow

## 2023-09-26 ENCOUNTER — Inpatient Hospital Stay: Payer: MEDICAID

## 2023-09-26 NOTE — Progress Notes (Signed)
 [facility] CSW Progress Note  Clinical Social Work introduced self to patient during Patient Education with Octavio Ben, Charity fundraiser.  Provided information regarding CSW role, including counseling, advanced care planning and support group.  Answered questions as needed.  Kennth Peal, LCSW Clinical Social Worker [facility]

## 2023-09-27 ENCOUNTER — Encounter
Admission: RE | Admit: 2023-09-27 | Discharge: 2023-09-27 | Disposition: A | Discharge status: Home / Self Care | Payer: Self-pay | Source: Ambulatory Visit | Attending: Oncology | Admitting: Oncology

## 2023-09-27 DIAGNOSIS — C83398 Diffuse large b-cell lymphoma of other extranodal and solid organ sites: Secondary | ICD-10-CM | POA: Insufficient documentation

## 2023-09-28 MED FILL — Fosaprepitant Dimeglumine For IV Infusion 150 MG (Base Eq): INTRAVENOUS | Qty: 5 | Status: AC

## 2023-09-29 ENCOUNTER — Telehealth: Payer: Self-pay | Admitting: Oncology

## 2023-09-29 ENCOUNTER — Ambulatory Visit: Payer: Self-pay

## 2023-09-29 ENCOUNTER — Encounter: Payer: Self-pay | Admitting: Oncology

## 2023-09-29 ENCOUNTER — Inpatient Hospital Stay: Payer: MEDICAID

## 2023-09-29 ENCOUNTER — Inpatient Hospital Stay (HOSPITAL_BASED_OUTPATIENT_CLINIC_OR_DEPARTMENT_OTHER): Payer: MEDICAID | Admitting: Oncology

## 2023-09-29 ENCOUNTER — Other Ambulatory Visit: Payer: Self-pay

## 2023-09-29 VITALS — BP 117/72 | HR 107 | Temp 98.0°F | Resp 16 | Wt 217.0 lb

## 2023-09-29 DIAGNOSIS — C83398 Diffuse large b-cell lymphoma of other extranodal and solid organ sites: Secondary | ICD-10-CM

## 2023-09-29 DIAGNOSIS — R7989 Other specified abnormal findings of blood chemistry: Secondary | ICD-10-CM

## 2023-09-29 DIAGNOSIS — Z7189 Other specified counseling: Secondary | ICD-10-CM

## 2023-09-29 LAB — CBC WITH DIFFERENTIAL (CANCER CENTER ONLY)
Abs Immature Granulocytes: 0 10*3/uL (ref 0.00–0.07)
Basophils Absolute: 0 10*3/uL (ref 0.0–0.1)
Basophils Relative: 0 %
Eosinophils Absolute: 0.2 10*3/uL (ref 0.0–0.5)
Eosinophils Relative: 1 %
HCT: 38.6 % — ABNORMAL LOW (ref 39.0–52.0)
Hemoglobin: 13.6 g/dL (ref 13.0–17.0)
Lymphocytes Relative: 4 %
Lymphs Abs: 1 10*3/uL (ref 0.7–4.0)
MCH: 30.6 pg (ref 26.0–34.0)
MCHC: 35.2 g/dL (ref 30.0–36.0)
MCV: 86.7 fL (ref 80.0–100.0)
Monocytes Absolute: 0.7 10*3/uL (ref 0.1–1.0)
Monocytes Relative: 3 %
Neutro Abs: 22.6 10*3/uL — ABNORMAL HIGH (ref 1.7–7.7)
Neutrophils Relative %: 92 %
Platelet Count: 446 10*3/uL — ABNORMAL HIGH (ref 150–400)
RBC: 4.45 MIL/uL (ref 4.22–5.81)
RDW: 13.9 % (ref 11.5–15.5)
Smear Review: NORMAL
WBC Count: 24.6 10*3/uL — ABNORMAL HIGH (ref 4.0–10.5)
nRBC: 0 % (ref 0.0–0.2)

## 2023-09-29 LAB — CMP (CANCER CENTER ONLY)
ALT: 200 U/L — ABNORMAL HIGH (ref 0–44)
AST: 225 U/L (ref 15–41)
Albumin: 3.1 g/dL — ABNORMAL LOW (ref 3.5–5.0)
Alkaline Phosphatase: 391 U/L — ABNORMAL HIGH (ref 38–126)
Anion gap: 12 (ref 5–15)
BUN: 20 mg/dL (ref 8–23)
CO2: 23 mmol/L (ref 22–32)
Calcium: 8.6 mg/dL — ABNORMAL LOW (ref 8.9–10.3)
Chloride: 97 mmol/L — ABNORMAL LOW (ref 98–111)
Creatinine: 0.82 mg/dL (ref 0.61–1.24)
GFR, Estimated: >60 mL/min (ref >60)
Glucose, Bld: 126 mg/dL — ABNORMAL HIGH (ref 70–99)
Potassium: 3.1 mmol/L — ABNORMAL LOW (ref 3.5–5.1)
Sodium: 132 mmol/L — ABNORMAL LOW (ref 135–145)
Total Bilirubin: 3.6 mg/dL (ref 0.0–1.2)
Total Protein: 6.5 g/dL (ref 6.5–8.1)

## 2023-09-29 LAB — LACTATE DEHYDROGENASE: LDH: 251 U/L — ABNORMAL HIGH (ref 98–192)

## 2023-09-29 MED ORDER — HEPARIN SOD (PORK) LOCK FLUSH 100 UNIT/ML IV SOLN
500.0000 [IU] | Freq: Once | INTRAVENOUS | Status: AC
  Administered 2023-09-29: 500 [IU] via INTRAVENOUS
  Filled 2023-09-29: qty 5

## 2023-09-29 MED ORDER — LEVOFLOXACIN 750 MG PO TABS: 750.0000 mg | ORAL_TABLET | Freq: Every day | ORAL | 0 refills | Status: DC

## 2023-09-29 MED ORDER — PREDNISONE 50 MG PO TABS: 100.0000 mg | ORAL_TABLET | Freq: Every day | ORAL | 0 refills | Status: AC

## 2023-09-29 NOTE — Addendum Note (Signed)
 Addended by: Avonne Boettcher on: 09/29/2023 01:39 PM   Modules accepted: Orders

## 2023-09-29 NOTE — Progress Notes (Addendum)
 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  On the day of his colonoscopy patient noticed that he was jaundiced and therefore underwent labs which showed bilirubin of 14 on his CMP AST ALT of 70 and 108 and alkaline phosphatase of 246 respectively.  This was followed by MRI abdomen with MRCP with and without contrast which showed a large complex partially necrotic mass involving the central aspect of the liver.  Mass is involving segment 4A, segment 8 and the caudate lobe.  Also surrounds the gallbladder and is obstructing the biliary tree centrally near the confluence of the right and left hepatic ducts.  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  Compression and possible obstruction of the left portal vein.  The main portal vein is patent and the middle and right portal veins are patent.  There is tumor adjacent to and compressing the intrahepatic IVC but no evidence of occlusion.  Gastrohepatic ligament and celiac axis and periportal nodes concerning for metastatic disease.  Patient underwent ERCP by Dr. Danise [name] on 08/23/2023 which showed a segmental biliary stricture that was malignant appearing and a plastic stent was placed.  Cells for cytology were obtained which was consistent with atypical glandular cells but not diagnostic for malignancy.   Patient was seen by Dr. Wheeler [name] from pancreaticobiliary surgery at [name] and was not deemed to  be a surgical candidate.  He was then seen by Dr. Rogue [name] from medical oncology who discussed the possibility that this could be locally advanced cholangiocarcinoma and discussed possible treatment options for the same.  However since patient did not have definitive tissue diagnosis liver biopsy was recommended.  He had a port placed in the meanwhile.  Liver biopsy surprisingly did not show any evidence of cholangiocarcinoma but was consistent with diffuse large B-cell lymphoma.   Diffuse large B cell lymphoma (DLBCL), germinal center B-cell like (GCB) subtype. See comment.   Comment: Histologic sections show needle core shaped tissue with diffuse infiltrate of large atypical lymphoid cells.  No normal liver parenchyma is identified.  The immunophenotype of the large atypical lymphoid cells are consistent with DLBCL GCB subtype per Alphonso Jean algorithm.  Staining for MYC and EBER was negative.83% of nuclei showed rearrangement of the MYC (8q24) locus by interphase FISH; 44-54% of nuclei showed additional, intact copies of the BCL6 locus (3q27) and BCL2 loci. No evidence of BCL2 rearrangement, BCL6 rearrangement, or IGH::MYC rearrangement was observed. MYC rearrangement in the absence of BCL2 or BCL6 gene rearrangements, is a nonspecific finding and may be seen in Burkitt lymphoma (BL) or diffuse large B-cell lymphoma, not otherwise specified (DLBCL, NOS).     Summary Table  Locus Result  MYC (8q24) 83% with MYC rearrangement  BCL2 (18q21) 54% with 3 intact copies No evidence of BCL2 rearrangement   BCL6 (3q27) 44% with 3 intact copies No evidence of BCL6 rearrangement     PET CT scan showed multiple hypermetabolic mass in the liver and tracer avid abdominal lymph nodes compatible with lymphoma.  There  was a thyroid  nodule 2.1 cm with an SUV of 6.6 and thyroid  ultrasound was recommended for the same.  Baseline echocardiogram normal.  Given abnormal LFTs and GCB subtype plan is to proceed with R-CHOP  chemotherapy instead of R Pola CHP chemotherapy.    Interval history-patient started taking prednisone  3 days ago and today is day 4 of 100 mg prednisone .  This morning patient reported feeling of chills but no fever.  No other acute symptoms at this time unknown ongoing fatigue.  ECOG PS- 1 Pain scale- 0 Opioid associated constipation- no  Review of systems- Review of Systems  Constitutional:  Positive for malaise/fatigue. Negative for chills, fever and weight loss.  HENT:  Negative for congestion, ear discharge and nosebleeds.   Eyes:  Negative for blurred vision.  Respiratory:  Negative for cough, hemoptysis, sputum production, shortness of breath and wheezing.   Cardiovascular:  Negative for chest pain, palpitations, orthopnea and claudication.  Gastrointestinal:  Negative for abdominal pain, blood in stool, constipation, diarrhea, heartburn, melena, nausea and vomiting.  Genitourinary:  Negative for dysuria, flank pain, frequency, hematuria and urgency.  Musculoskeletal:  Negative for back pain, joint pain and myalgias.  Skin:  Negative for rash.  Neurological:  Negative for dizziness, tingling, focal weakness, seizures, weakness and headaches.  Endo/Heme/Allergies:  Does not bruise/bleed easily.  Psychiatric/Behavioral:  Negative for depression and suicidal ideas. The patient does not have insomnia.       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    GERD (gastroesophageal reflux disease)    Headache    Hyperlipidemia    Hypertension    Statin myopathy    Stroke [facility])    Thyroid  nodule      Past Surgical History:  Procedure Laterality Date   COLONOSCOPY N/A 08/18/2023   Procedure:  COLONOSCOPY;  Surgeon: Joel Darling, MD;  Location: [facility] ENDOSCOPY;  Service: Endoscopy;  Laterality: N/A;   ERCP N/A 08/23/2023   Procedure: ERCP, WITH INTERVENTION IF INDICATED;  Surgeon: Marnee Sink, MD;  Location: [facility] ENDOSCOPY;  Service: Endoscopy;  Laterality: N/A;   MOHS SURGERY     2025, left cheek   POLYPECTOMY  08/18/2023   Procedure: POLYPECTOMY, INTESTINE;  Surgeon: Joel Darling, MD;  Location: [facility] ENDOSCOPY;  Service: Endoscopy;;   TONSILLECTOMY     1968    Social History   Socioeconomic History   Marital status: Divorced    Spouse name: Not on file   Number of children: Not on file   Years of education: Not on file   Highest education level: Not on file  Occupational History   Not on file  Tobacco Use   Smoking status: Former   Smokeless tobacco: Never  Vaping Use   Vaping status: Never Used  Substance and Sexual Activity   Alcohol use: Yes    Comment: occasional   Drug use: No   Sexual activity: Not on file  Other Topics Concern   Not on file  Social History Narrative   Not on file   Social Drivers of Health   Financial Resource Strain: Low Risk  (07/05/2023)   Received from [facility]   Overall Financial Resource  Strain (CARDIA)    Difficulty of Paying Living Expenses: Not hard at all  Food Insecurity: No Food Insecurity (09/20/2023)   Hunger Vital Sign    Worried About Running Out of Food in the Last Year: Never true    Ran Out of Food in the Last Year: Never true  Transportation Needs: No Transportation Needs (07/05/2023)   Received from [facility] - Transportation    In the past 12 months, has lack of transportation kept you from medical appointments or from getting medications?: No    Lack of Transportation (Non-Medical): No  Physical Activity: Not on file  Stress: Not on file  Social Connections: Not on file  Intimate Partner Violence: Not At Risk (09/20/2023)   Humiliation, Afraid,  Rape, and Kick questionnaire    Fear of Current or Ex-Partner: No    Emotionally Abused: No    Physically Abused: No    Sexually Abused: No    Family History  Problem Relation Age of Onset   Cancer Sister    Cancer Brother      Current Outpatient Medications:    acyclovir  (ZOVIRAX ) 400 MG tablet, Take 1 tablet (400 mg total) by mouth daily., Disp: 30 tablet, Rfl: 3   allopurinol  (ZYLOPRIM ) 300 MG tablet, Take 1 tablet (300 mg total) by mouth daily., Disp: 30 tablet, Rfl: 3   ALPRAZolam (XANAX) 1 MG tablet, Take 0.5 mg by mouth daily as needed for anxiety., Disp: , Rfl:    amphetamine-dextroamphetamine (ADDERALL XR) 20 MG 24 hr capsule, Take 20 mg by mouth daily., Disp: , Rfl:    aspirin 81 MG chewable tablet, Chew 81 mg by mouth daily., Disp: , Rfl:    buPROPion (WELLBUTRIN XL) 300 MG 24 hr tablet, Take 300 mg by mouth daily., Disp: , Rfl:    ezetimibe (ZETIA) 10 MG tablet, Take 1 tablet by mouth daily., Disp: , Rfl:    lidocaine -prilocaine  (EMLA ) cream, Apply to affected area once, Disp: 30 g, Rfl: 3   naproxen  (NAPROSYN ) 500 MG tablet, Take 1 tablet (500 mg total) by mouth 2 (two) times daily with a meal., Disp: 20 tablet, Rfl: 0   ondansetron  (ZOFRAN  ODT) 4 MG disintegrating tablet, Take 1 tablet (4 mg total) by mouth every 8 (eight) hours as needed for nausea or vomiting., Disp: 20 tablet, Rfl: 0   ondansetron  (ZOFRAN ) 8 MG tablet, Take 1 tablet (8 mg total) by mouth every 8 (eight) hours as needed for nausea or vomiting. Start on the third day after cyclophosphamide chemotherapy., Disp: 30 tablet, Rfl: 1   prochlorperazine  (COMPAZINE ) 10 MG tablet, Take 1 tablet (10 mg total) by mouth every 6 (six) hours as needed for nausea or vomiting., Disp: 30 tablet, Rfl: 6   telmisartan -hydrochlorothiazide  (MICARDIS  HCT) 80-25 MG tablet, Take 1 tablet by mouth daily., Disp: 90 tablet, Rfl: 0   traZODone (DESYREL) 50 MG tablet, Take 50 mg by mouth at bedtime., Disp: , Rfl:    atorvastatin  (LIPITOR) 40 MG tablet, Take 40 mg by mouth daily. (Patient not taking: Reported on 08/23/2023), Disp: , Rfl:    gabapentin  (NEURONTIN ) 100 MG capsule, Take 1 capsule (100 mg total) by mouth 3 (three) times daily. (Patient not taking: Reported on 09/20/2023), Disp: 30 capsule, Rfl: 0   predniSONE  (DELTASONE ) 50 MG tablet, Take 2 tablets (100 mg total) by mouth daily with breakfast for 5 days., Disp: 10 tablet, Rfl: 0  Physical exam:  Vitals:   09/29/23 4098  BP: 117/72  Pulse: (!) 107  Resp: 16  Temp: 98 F (36.7 C)  TempSrc: Tympanic  SpO2: 98%  Weight: 217 lb (98.4 kg)   Physical Exam Eyes:     General: Scleral icterus present.  Cardiovascular:     Rate and Rhythm: Normal rate and regular rhythm.     Heart sounds: Normal heart sounds.  Pulmonary:     Effort: Pulmonary effort is normal.     Breath sounds: Normal breath sounds.  Abdominal:     General: Bowel sounds are normal.     Palpations: Abdomen is soft.  Skin:    General: Skin is warm and dry.  Neurological:     Mental Status: He is alert and oriented to person, place, and time.      I have personally reviewed labs listed below:    Latest Ref Rng & Units 09/29/2023    8:04 AM  CMP  Glucose 70 - 99 mg/dL 604   BUN 8 - 23 mg/dL 20   Creatinine 5.40 - 1.24 mg/dL 9.81   Sodium 191 - 478 mmol/L 132   Potassium 3.5 - 5.1 mmol/L 3.1   Chloride 98 - 111 mmol/L 97   CO2 22 - 32 mmol/L 23   Calcium 8.9 - 10.3 mg/dL 8.6   Total Protein 6.5 - 8.1 g/dL 6.5   Total Bilirubin 0.0 - 1.2 mg/dL 3.6   Alkaline Phos 38 - 126 U/L 391   AST 15 - 41 U/L 225   ALT 0 - 44 U/L 200       Latest Ref Rng & Units 09/29/2023    8:04 AM  CBC  WBC 4.0 - 10.5 K/uL 24.6   Hemoglobin 13.0 - 17.0 g/dL 29.5   Hematocrit 62.1 - 52.0 % 38.6   Platelets 150 - 400 K/uL 446    I have personally reviewed Radiology images listed below: No images are attached to the encounter.  NM Cardiac Muga Rest Result Date: 09/27/2023 CLINICAL DATA:   [place]'s tumor EXAM: NUCLEAR MEDICINE CARDIAC BLOOD POOL IMAGING (MUGA) TECHNIQUE: Cardiac multi-gated acquisition was performed at rest following intravenous injection of Tc-54m labeled red blood cells. RADIOPHARMACEUTICALS:  21.5 mCi Tc-80m pertechnetate in-vitro labeled red blood cells IV COMPARISON:  PET-CT, 09/25/2023 FINDINGS: The LEFT ventricular wall is normal in size and demonstrate normal wall motion. Calculated LEFT ventricular ejection fraction; 55% IMPRESSION: 1. Normal LEFT ventricular size and wall motion. 2. Calculated LVEF 55%. Electronically Signed   By: Art Largo M.D.   On: 09/27/2023 17:58   NM PET Image Initial (PI) Skull Base To Thigh Result Date: 09/25/2023 CLINICAL DATA:  Initial treatment strategy for diffuse large B-cell lymphoma. EXAM: NUCLEAR MEDICINE PET SKULL BASE TO THIGH TECHNIQUE: 11.93 mCi F-18 FDG was injected intravenously. Full-ring PET imaging was performed from the skull base to thigh after the radiotracer. CT data was obtained and used for attenuation correction and anatomic localization. Fasting blood glucose: 92 mg/dl COMPARISON:  MRI 30/86/5784 FINDINGS: Mediastinal blood pool activity: SUV max 2.8 Liver activity: SUV max 4.0 NECK: No hypermetabolic lymph nodes in the neck. Incidental CT findings: Tracer avid nodule within the inferior pole of right lobe of thyroid  gland measures 2.1 cm and has an SUV max 6.6, image 37. CHEST: No tracer avid axillary or supraclavicular lymph nodes. No tracer avid mediastinal or hilar lymph nodes. Within the far right lateral cardiophrenic angle fat there is a tracer avid lymph node which measures 0.6 cm with SUV max of 9.2,  image 72. No tracer avid pulmonary nodules identified. Incidental CT findings: Aortic atherosclerosis and multi vessel coronary artery calcification. ABDOMEN/PELVIS: Tracer avid mass involving segments 8, 5, 4, and 0 is identified. The dominant component involving segments 4 and 8 measures 8.7 x 7.5 cm and has an  SUV max of 13.6, image 74. As noted on the recent MRI the tumor extends around the gallbladder and obstructs the confluence of the bile ducts. No abnormal tracer uptake within the pancreas. No abnormal uptake within the spleen. The spleen is normal in size measuring 12.4 cm in length. Normal appearance of the adrenal glands. Tracer avid abdominal lymph nodes identified. Index lymph node adjacent to the caudate lobe measures 0.6 cm and has an SUV max of 5.6, image 80. Retrocaval lymph node at the level of the right renal vein measures 1.3 cm with SUV max of 10.4, image 93. No tracer avid pelvic or inguinal lymph nodes. Incidental CT findings: Mild aortic atherosclerosis. Interval placement of common bile duct stent. SKELETON: No focal hypermetabolic activity to suggest skeletal metastasis. Incidental CT findings: None. IMPRESSION: 1. Tracer avid mass involving segments 8, 5, 4, and 0 of the liver is identified compatible with biopsy-proven lymphoma. The dominant component involving segments 4 and 8 measures 8.7 x 7.5 cm and has an SUV max of 13.6. 2. Tracer avid abdominal lymph nodes compatible with lymphoma. 3. Tracer avid nodule within the inferior pole of right lobe of thyroid  gland measures 2.1 cm and has an SUV max 6.6. Recommend thyroid  US  and biopsy (ref: J Am Coll Radiol. 2015 Feb;12(2): 143-50). 4.  Aortic Atherosclerosis (ICD10-I70.0). Electronically Signed   By: Kimberley Penman M.D.   On: 09/25/2023 12:53     Assessment and plan- Patient is a 63 y.o. 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  R-CHOP chemotherapy is being offered instead of Pola R CHP chemotherapy in view of GCB subtype of lymphoma and abnormal LFTs.  Today is day 4 of prednisone .  His liver functions are further abnormal today with elevated AST and ALT and a bilirubin of 3.6.  We are unable to give Neulasta within  the 72-hour window given the long weekend and we are still awaiting drug company assistance for Neulasta given the patient is self-pay.  I am also concerned about the possibility of stent blockage and possible cholangitis given his symptoms of chills this morning although patient is not overtly febrile but is a bit tachycardic in our clinic today.  I personally discussed this case with Dr. Ole Berkeley and we will plan to get a repeat ERCP on 10/03/2023.  I am also sending a prescription for Levaquin 750 mg daily for 7 days.  If there is evidence of stent blockage noted on 10/03/2023 then we will extend his duration of antibiotics and I will start chemotherapy in 2 weeks.  However if there is no evidence of stent blockage noted we will plan for cycle 1 of R-CHOP chemotherapy on 10/04/2023.   I am also hoping that the steroids will start working preemptively on the lymphoma and we may see some improvement in his LFTs.  Patient understands this plan well and if he has any further clinical deterioration over the weekend including chills and fever despite starting antibiotics he will need to get hospitalized.  Although his lymphoma is contributing to his abnormal LFTs, patient did have a malignant stricture noted at  the time of ERCP 2 weeks ago and his bilirubin had decreased from 14-2.5 and is now uptrending again.  Baseline lab's do not show any evidence of tumor lysis syndrome.  His potassium is low today at 3.1 but I am not replacing potassium given that he may develop tumor lysis following for cycle of chemotherapy.  Baseline hepatitis testing negative and echocardiogram was normal.  I also discussed the results of PET scan which mainly shows bulk of disease within the liver with evidence of adjacent intra-abdominal adenopathy but no evidence of disease elsewhere.  Patient will directly proceed for cycle 1 of chemotherapy next week and I will see him back in 4 weeks for cycle 2.  We will bring him back 1 week after  chemotherapy to see covering NP with labs for possible IV fluids.  Patient understands that he needs to keep up with his hydration during chemotherapy to reduce the risk of tumor lysis.  He will also continue to take allopurinol  300 mg daily along with acyclovir  prophylaxis.   Visit Diagnosis 1. Abnormal LFTs   2. Diffuse large B-cell lymphoma of solid organ excluding spleen      Dr. Seretha Dance, MD, MPH [facility] at [facility] [phone] 09/29/2023 11:58 AM

## 2023-09-29 NOTE — Telephone Encounter (Signed)
 He is seeing me today.  He should come today as planned

## 2023-09-29 NOTE — Progress Notes (Signed)
 Pharmacist Chemotherapy Monitoring - Initial Assessment    Anticipated start date: 10/04/23   The following has been reviewed per standard work regarding the patient's treatment regimen: The patient's diagnosis, treatment plan and drug doses, and organ/hematologic function Lab orders and baseline tests specific to treatment regimen  The treatment plan start date, drug sequencing, and pre-medications Prior authorization status  Patient's documented medication list, including drug-drug interaction screen and prescriptions for anti-emetics and supportive care specific to the treatment regimen The drug concentrations, fluid compatibility, administration routes, and timing of the medications to be used The patient's access for treatment and lifetime cumulative dose history, if applicable  The patient's medication allergies and previous infusion related reactions, if applicable   Changes made to treatment plan:  Note possible dose reduction depending on labs  Follow up needed:  Patient assistance  rituxan replacement. Check labs due to elevated bilirubin and elevated LFT. Higher risk for tumor lysis syndrome but on allopurinol  300mg  daily and zovirax  prophylaxis   Emaline Handsome, RPH, 09/29/2023  2:44 PM

## 2023-09-29 NOTE — Telephone Encounter (Signed)
 Pt left message with answering service. Having chills, pain, no fever. Would like a call to discuss.

## 2023-09-29 NOTE — Progress Notes (Signed)
 No treatment today per Dr. Randy Buttery, based on patient's lab results. Patient will return next Wednesday for repeat labs and treatment.

## 2023-10-01 ENCOUNTER — Other Ambulatory Visit: Payer: Self-pay

## 2023-10-03 ENCOUNTER — Telehealth: Payer: Self-pay

## 2023-10-03 ENCOUNTER — Ambulatory Visit: Payer: Self-pay | Admitting: Certified Registered"

## 2023-10-03 ENCOUNTER — Encounter: Payer: Self-pay | Admitting: Gastroenterology

## 2023-10-03 ENCOUNTER — Ambulatory Visit: Payer: Self-pay

## 2023-10-03 ENCOUNTER — Inpatient Hospital Stay: Payer: MEDICAID

## 2023-10-03 ENCOUNTER — Encounter
Admission: RE | Disposition: A | Discharge status: Home / Self Care | Payer: Self-pay | Source: Home / Self Care | Attending: Gastroenterology

## 2023-10-03 ENCOUNTER — Other Ambulatory Visit: Payer: Self-pay

## 2023-10-03 ENCOUNTER — Ambulatory Visit
Admission: RE | Admit: 2023-10-03 | Discharge: 2023-10-03 | Disposition: A | Discharge status: Home / Self Care | Payer: Self-pay | Attending: Gastroenterology | Admitting: Gastroenterology

## 2023-10-03 DIAGNOSIS — Z4659 Encounter for fitting and adjustment of other gastrointestinal appliance and device: Secondary | ICD-10-CM

## 2023-10-03 DIAGNOSIS — J449 Chronic obstructive pulmonary disease, unspecified: Secondary | ICD-10-CM | POA: Insufficient documentation

## 2023-10-03 DIAGNOSIS — Z8673 Personal history of transient ischemic attack (TIA), and cerebral infarction without residual deficits: Secondary | ICD-10-CM | POA: Insufficient documentation

## 2023-10-03 DIAGNOSIS — K831 Obstruction of bile duct: Secondary | ICD-10-CM | POA: Insufficient documentation

## 2023-10-03 DIAGNOSIS — I452 Bifascicular block: Secondary | ICD-10-CM | POA: Insufficient documentation

## 2023-10-03 DIAGNOSIS — F909 Attention-deficit hyperactivity disorder, unspecified type: Secondary | ICD-10-CM | POA: Insufficient documentation

## 2023-10-03 DIAGNOSIS — Z87891 Personal history of nicotine dependence: Secondary | ICD-10-CM | POA: Insufficient documentation

## 2023-10-03 DIAGNOSIS — I4510 Unspecified right bundle-branch block: Secondary | ICD-10-CM | POA: Insufficient documentation

## 2023-10-03 DIAGNOSIS — Z9889 Other specified postprocedural states: Secondary | ICD-10-CM

## 2023-10-03 DIAGNOSIS — I10 Essential (primary) hypertension: Secondary | ICD-10-CM | POA: Insufficient documentation

## 2023-10-03 DIAGNOSIS — K838 Other specified diseases of biliary tract: Secondary | ICD-10-CM

## 2023-10-03 DIAGNOSIS — K219 Gastro-esophageal reflux disease without esophagitis: Secondary | ICD-10-CM | POA: Insufficient documentation

## 2023-10-03 HISTORY — PX: ERCP: SHX5425

## 2023-10-03 SURGERY — ERCP, WITH INTERVENTION IF INDICATED
Anesthesia: General

## 2023-10-03 MED ORDER — SODIUM CHLORIDE 0.9 % IV SOLN
INTRAVENOUS | Status: DC
Start: 2023-10-03 — End: 2023-10-03

## 2023-10-03 MED ORDER — PROPOFOL 500 MG/50ML IV EMUL
INTRAVENOUS | Status: DC | PRN
  Administered 2023-10-03: 175 ug/kg/min via INTRAVENOUS

## 2023-10-03 MED ORDER — HYDRALAZINE HCL 20 MG/ML IJ SOLN
INTRAMUSCULAR | Status: AC
  Filled 2023-10-03: qty 1

## 2023-10-03 MED ORDER — PROPOFOL 10 MG/ML IV BOLUS
INTRAVENOUS | Status: DC | PRN
  Administered 2023-10-03: 70 mg via INTRAVENOUS
  Administered 2023-10-03: 20 mg via INTRAVENOUS

## 2023-10-03 MED ORDER — DEXMEDETOMIDINE HCL IN NACL 80 MCG/20ML IV SOLN
INTRAVENOUS | Status: DC | PRN
  Administered 2023-10-03: 8 ug via INTRAVENOUS

## 2023-10-03 MED ORDER — LACTATED RINGERS IV SOLN: INTRAVENOUS | Status: DC

## 2023-10-03 MED ORDER — LIDOCAINE HCL (CARDIAC) PF 100 MG/5ML IV SOSY
PREFILLED_SYRINGE | INTRAVENOUS | Status: DC | PRN
  Administered 2023-10-03: 60 mg via INTRAVENOUS

## 2023-10-03 MED ORDER — FENTANYL CITRATE (PF) 250 MCG/5ML IJ SOLN
50.0000 ug | Freq: Once | INTRAMUSCULAR | Status: AC
  Administered 2023-10-03: 50 ug via INTRAVENOUS
  Filled 2023-10-03: qty 1

## 2023-10-03 MED ORDER — FENTANYL CITRATE PF 50 MCG/ML IJ SOSY
PREFILLED_SYRINGE | INTRAMUSCULAR | Status: AC
  Filled 2023-10-03: qty 1

## 2023-10-03 MED ORDER — HYDRALAZINE HCL 20 MG/ML IJ SOLN
10.0000 mg | Freq: Once | INTRAMUSCULAR | Status: AC
  Administered 2023-10-03: 10 mg via INTRAVENOUS

## 2023-10-03 MED FILL — Fosaprepitant Dimeglumine For IV Infusion 150 MG (Base Eq): INTRAVENOUS | Qty: 5 | Status: AC

## 2023-10-03 NOTE — Anesthesia Preprocedure Evaluation (Signed)
 Anesthesia Evaluation  Patient identified by MRN, date of birth, ID band Patient awake    Reviewed: Allergy & Precautions, H&P , NPO status , Patient's Chart, lab work & pertinent test results, reviewed documented beta blocker date and time   History of Anesthesia Complications Negative for: history of anesthetic complications  Airway Mallampati: IV  TM Distance: >3 FB Neck ROM: full    Dental  (+) Dental Advidsory Given, Caps   Pulmonary neg shortness of breath, COPD, neg recent URI, former smoker   Pulmonary exam normal breath sounds clear to auscultation       Cardiovascular Exercise Tolerance: Good hypertension, (-) angina (-) Past MI and (-) Cardiac Stents Normal cardiovascular exam(-) dysrhythmias (-) Valvular Problems/Murmurs Rhythm:regular Rate:Normal  ECG 07/20/23: 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; RBBB, LAFB; bifascicular block; no STEMI  Echo 10/18/22:  Normal Stress Echocardiogram  NORMAL RIGHT VENTRICULAR SYSTOLIC FUNCTION  TRIVIAL REGURGITATION NOTED  NO VALVULAR STENOSIS NOTED     Neuro/Psych  Headaches, neg Seizures PSYCHIATRIC DISORDERS (ADHD)      CVA (Ct scan with evidence of old stroke)    GI/Hepatic ,GERD  Medicated and Controlled,,  Endo/Other  negative endocrine ROSneg diabetes    Renal/GU negative Renal ROS  negative genitourinary   Musculoskeletal   Abdominal  (+) + obese  Peds  Hematology negative hematology ROS (+)   Anesthesia Other Findings Jaundice   Past Medical History: No date: ADD (attention deficit disorder) No date: Carpal tunnel syndrome, bilateral No date: COPD (chronic obstructive pulmonary disease) (HCC) No date: 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 No date: GERD (gastroesophageal reflux disease) No date: Headache No date: Hyperlipidemia No date: Hypertension No date: Statin myopathy No date: Thyroid nodule   Reproductive/Obstetrics negative OB ROS                              Anesthesia Physical Anesthesia Plan  ASA: 3  Anesthesia Plan: General   Post-op Pain Management: Minimal or no pain anticipated   Induction: Intravenous  PONV Risk Score and Plan: 2 and Propofol infusion, TIVA and Treatment may vary due to age or medical condition  Airway Management Planned: Natural Airway and Nasal Cannula  Additional Equipment:   Intra-op Plan:   Post-operative Plan:   Informed Consent: I have reviewed the patients History and Physical, chart, labs and discussed the procedure including the risks, benefits and alternatives for the proposed anesthesia with the patient or authorized representative who has indicated his/her understanding and acceptance.     Dental Advisory Given  Plan Discussed with: Anesthesiologist, CRNA and Surgeon  Anesthesia Plan Comments:         Anesthesia Quick Evaluation

## 2023-10-03 NOTE — OR Nursing (Signed)
 Anesthesiologist alerted about pt pain and elevated BP. Dr. Ole Berkeley alerted as well. Anesthesia ordered 50mcg of Fentanyl for pain, hoping to lower BP as well

## 2023-10-03 NOTE — H&P (Signed)
 Marnee Sink, MD [facility] [address] [place], [place] [address] Phone:[phone] Fax : [phone]  Primary Care Physician:  Yehuda Helms, MD Primary Gastroenterologist:  Dr. Ole Berkeley  Pre-Procedure History & Physical: HPI:  Joel [name] is a 63 y.o. male is here for an ERCP.   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    GERD (gastroesophageal reflux disease)    Headache    Hyperlipidemia    Hypertension    Statin myopathy    Stroke [facility])    Thyroid  nodule     Past Surgical History:  Procedure Laterality Date   COLONOSCOPY N/A 08/18/2023   Procedure: COLONOSCOPY;  Surgeon: Shane Darling, MD;  Location: [facility] ENDOSCOPY;  Service: Endoscopy;  Laterality: N/A;   ERCP N/A 08/23/2023   Procedure: ERCP, WITH INTERVENTION IF INDICATED;  Surgeon: Marnee Sink, MD;  Location: [facility] ENDOSCOPY;  Service: Endoscopy;  Laterality: N/A;   MOHS SURGERY     2025, left cheek   POLYPECTOMY  08/18/2023   Procedure: POLYPECTOMY, INTESTINE;  Surgeon: Shane Darling, MD;  Location: [facility] ENDOSCOPY;  Service: Endoscopy;;   TONSILLECTOMY     1968    Prior to Admission medications   Medication Sig Start Date End Date Taking? Authorizing Provider  allopurinol  (ZYLOPRIM ) 300 MG tablet Take 1 tablet (300 mg total) by mouth daily. 09/20/23  Yes Avonne Boettcher, MD  aspirin 81 MG chewable tablet Chew 81 mg by mouth daily.   Yes [provider]  buPROPion (WELLBUTRIN XL) 300 MG 24 hr tablet Take 300 mg by mouth daily. 04/14/23  Yes [provider]  levofloxacin (LEVAQUIN) 750 MG tablet Take 1 tablet (750 mg total) by mouth daily. 09/29/23  Yes Avonne Boettcher, MD  omeprazole (PRILOSEC) 40 MG capsule Take 40 mg by mouth daily.   Yes [provider]  predniSONE  (DELTASONE ) 50 MG tablet Take 2  tablets (100 mg total) by mouth daily with breakfast for 5 days. 09/29/23 10/04/23 Yes Avonne Boettcher, MD  telmisartan -hydrochlorothiazide  (MICARDIS  HCT) 80-25 MG tablet Take 1 tablet by mouth daily. 05/31/17  Yes Jacquie Maudlin, MD  acyclovir  (ZOVIRAX ) 400 MG tablet Take 1 tablet (400 mg total) by mouth daily. 09/20/23   Rao, Archana C, MD  ALPRAZolam (XANAX) 1 MG tablet Take 0.5 mg by mouth daily as needed for anxiety.    [provider]  amphetamine-dextroamphetamine (ADDERALL XR) 20 MG 24 hr capsule Take 20 mg by mouth daily.    [provider]  atorvastatin (LIPITOR) 40 MG tablet Take 40 mg by mouth daily. Patient not taking: Reported on 08/23/2023    [provider]  ezetimibe (ZETIA) 10 MG tablet Take 1 tablet by mouth daily. 07/05/23 07/04/24  [provider]  gabapentin  (NEURONTIN ) 100 MG capsule Take 1 capsule (100 mg total) by mouth 3 (three) times daily. Patient not taking: Reported on 09/20/2023 04/02/22 09/20/23  Lynnda Sas, MD  lidocaine -prilocaine  (EMLA ) cream Apply to affected area once 09/20/23   Avonne Boettcher, MD  naproxen  (NAPROSYN ) 500 MG tablet Take 1 tablet (500 mg total) by mouth 2 (two) times daily with a meal. 05/31/17   Jacquie Maudlin, MD  ondansetron  (ZOFRAN  ODT) 4 MG disintegrating tablet Take 1 tablet (4 mg total) by mouth every 8 (eight) hours as needed for  nausea or vomiting. 05/31/17   Jacquie Maudlin, MD  ondansetron  (ZOFRAN ) 8 MG tablet Take 1 tablet (8 mg total) by mouth every 8 (eight) hours as needed for nausea or vomiting. Start on the third day after cyclophosphamide chemotherapy. 09/20/23   Avonne Boettcher, MD  prochlorperazine  (COMPAZINE ) 10 MG tablet Take 1 tablet (10 mg total) by mouth every 6 (six) hours as needed for nausea or vomiting. 09/20/23   Avonne Boettcher, MD  traZODone (DESYREL) 50 MG tablet Take 50 mg by mouth at bedtime.    [provider]    Allergies as of 08/28/2023 - Review Complete 08/23/2023   Allergen Reaction Noted   Hydrocodone-acetaminophen  Other (See Comments) 12/02/2013   Penicillins  01/28/2017    Family History  Problem Relation Age of Onset   Cancer Sister    Cancer Brother     Social History   Socioeconomic History   Marital status: Divorced    Spouse name: Not on file   Number of children: Not on file   Years of education: Not on file   Highest education level: Not on file  Occupational History   Not on file  Tobacco Use   Smoking status: Former   Smokeless tobacco: Never  Vaping Use   Vaping status: Never Used  Substance and Sexual Activity   Alcohol use: Yes    Comment: occasional   Drug use: No   Sexual activity: Not on file  Other Topics Concern   Not on file  Social History Narrative   Not on file   Social Drivers of Health   Financial Resource Strain: Low Risk  (07/05/2023)   Received from [facility]   Overall Financial Resource Strain (CARDIA)    Difficulty of Paying Living Expenses: Not hard at all  Food Insecurity: No Food Insecurity (09/20/2023)   Hunger Vital Sign    Worried About Running Out of Food in the Last Year: Never true    Ran Out of Food in the Last Year: Never true  Transportation Needs: No Transportation Needs (07/05/2023)   Received from [facility]   PRAPARE - Transportation    In the past 12 months, has lack of transportation kept you from medical appointments or from getting medications?: No    Lack of Transportation (Non-Medical): No  Physical Activity: Not on file  Stress: Not on file  Social Connections: Not on file  Intimate Partner Violence: Not At Risk (09/20/2023)   Humiliation, Afraid, Rape, and Kick questionnaire    Fear of Current or Ex-Partner: No    Emotionally Abused: No    Physically Abused: No    Sexually Abused: No    Review of Systems: See HPI, otherwise negative ROS  Physical Exam: BP (!) 161/95   Pulse 65   Temp (!) 96.9 F (36.1 C) (Temporal)    Resp 16   Ht 5\' 6"  (1.676 m)   Wt 96.6 kg   SpO2 99%   BMI 34.38 kg/m  General:   Alert,  pleasant and cooperative in NAD Head:  Normocephalic and atraumatic. Neck:  Supple; no masses or thyromegaly. Lungs:  Clear throughout to auscultation.    Heart:  Regular rate and rhythm. Abdomen:  Soft, nontender and nondistended. Normal bowel sounds, without guarding, and without rebound.   Neurologic:  Alert and  oriented x4;  grossly normal neurologically.  Impression/Plan: Joel [name] is here for an ERCP to be performed for increased LFT's  Risks, benefits,  limitations, and alternatives regarding  colonoscopy have been reviewed with the patient.  Questions have been answered.  All parties agreeable.   Marnee Sink, MD  10/03/2023, 9:54 AM

## 2023-10-03 NOTE — Telephone Encounter (Signed)
 ercp 3 months

## 2023-10-03 NOTE — OR Nursing (Signed)
 Anesthesia ok for d/c with BP 177/97. Pt pain improving

## 2023-10-03 NOTE — Op Note (Addendum)
 [facility] Patient Name: Joel [name] Procedure Date: 10/03/2023 10:40 AM MRN: [id number] Account #: [id number] Date of Birth: 06-12-1960 Admit Type: Outpatient Age: 63 Room: [facility] ENDO ROOM 4 Gender: Male Note Status: Finalized Instrument Name: Jhonnie Mosher [id number] Procedure:             ERCP Indications:           Stent change Providers:             Marnee Sink MD, MD Medicines:             Propofol  per Anesthesia Complications:         No immediate complications. Procedure:             Pre-Anesthesia Assessment:                        - Prior to the procedure, a History and Physical was                         performed, and patient medications and allergies were                         reviewed. The patient's tolerance of previous                         anesthesia was also reviewed. The risks and benefits                         of the procedure and the sedation options and risks                         were discussed with the patient. All questions were                         answered, and informed consent was obtained. Prior                         Anticoagulants: The patient has taken no anticoagulant                         or antiplatelet agents. ASA Grade Assessment: II - A                         patient with mild systemic disease. After reviewing                         the risks and benefits, the patient was deemed in                         satisfactory condition to undergo the procedure.                        After obtaining informed consent, the scope was passed                         under direct vision. Throughout the procedure, the                         patient's blood pressure, pulse, and  oxygen                         saturations were monitored continuously. The                         Duodenoscope was introduced through the mouth, and                         used to inject contrast into and used to inject                          contrast into the bile duct. The ERCP was accomplished                         without difficulty. The patient tolerated the                         procedure fairly well. Findings:      A biliary stent was visible on the scout film. The esophagus was       successfully intubated under direct vision. The scope was advanced to a       normal major papilla in the descending duodenum without detailed       examination of the pharynx, larynx and associated structures, and upper       GI tract. The upper GI tract was grossly normal. The bile duct was       deeply cannulated with the short-nosed traction sphincterotome. Contrast       was injected. I personally interpreted the bile duct images. There was       brisk flow of contrast through the ducts. Image quality was excellent.       Contrast extended to the entire biliary tree. The main bile duct       contained multiple segmental stenoses. A wire was passed into the       biliary tree. One stent was removed from the right hepatic duct using a       snare. One 7 Fr by 9 cm plastic stent with a single external flap and a       single internal flap was placed 7 cm into the right hepatic duct. Bile       flowed through the stent. The stent was in good position. One 7 Fr by 9       cm plastic stent with a single external flap and a single internal flap       was placed 7 cm into the left hepatic duct. Bile flowed through the       stent. The stent was in good position. 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                        - One stent was removed from the right hepatic duct.                        - One plastic stent was placed into the right hepatic  duct.                        - One plastic stent was placed into the left hepatic                         duct. Recommendation:        - Resume previous diet.                         - Discharge patient to home.                        - Watch for pancreatitis, bleeding, perforation, and                         cholangitis.                        - Continue present medications.                        - Repeat ERCP in 3 months to remove stent. Procedure Code(s):     --- Professional ---                        [phone], Endoscopic retrograde cholangiopancreatography                         (ERCP); with removal and exchange of stent(s), biliary                         or pancreatic duct, including pre- and post-dilation                         and guide wire passage, when performed, including                         sphincterotomy, when performed, each stent exchanged                        43274, 59, Endoscopic retrograde                         cholangiopancreatography (ERCP); with placement of                         endoscopic stent into biliary or pancreatic duct,                         including pre- and post-dilation and guide wire                         passage, when performed, including sphincterotomy,                         when performed, each stent                        60454, Endoscopic catheterization of the biliary  ductal system, radiological supervision and                         interpretation Diagnosis Code(s):     --- Professional ---                        K83.1, Obstruction of bile duct                        Z46.59, Encounter for fitting and adjustment of other                         gastrointestinal appliance and device CPT copyright 2022 American Medical Association. All rights reserved. The codes documented in this report are preliminary and upon coder review may  be revised to meet current compliance requirements. Marnee Sink MD, MD 10/03/2023 11:33:43 AM This report has been signed electronically. Number of Addenda: 0 Note Initiated On: 10/03/2023 10:40 AM Estimated Blood Loss:  Estimated blood loss: none.  Estimated blood loss: none.      [facility]

## 2023-10-03 NOTE — OR Nursing (Signed)
 Alerted anesthesiologist of continued elevated BP 183/87, pain down to 4/10. Ordered 10mg  hydralazine

## 2023-10-03 NOTE — Transfer of Care (Signed)
 Immediate Anesthesia Transfer of Care Note  Patient: Joel [name]  Procedure(s) Performed: ERCP, WITH INTERVENTION IF INDICATED  Patient Location: Endoscopy Unit  Anesthesia Type:General  Level of Consciousness: drowsy and patient cooperative  Airway & Oxygen Therapy: Patient Spontanous Breathing  Post-op Assessment: Report given to RN, Post -op Vital signs reviewed and stable, and Patient moving all extremities X 4  Post vital signs: Reviewed and stable  Last Vitals:  Vitals Value Taken Time  BP 156/89 10/03/23 1133  Temp 36.1 C 10/03/23 1133  Pulse 70 10/03/23 1134  Resp 18 10/03/23 1134  SpO2 96 % 10/03/23 1134  Vitals shown include unfiled device data.  Last Pain:  Vitals:   10/03/23 1133  TempSrc: Temporal  PainSc: Asleep         Complications: No notable events documented.

## 2023-10-04 ENCOUNTER — Inpatient Hospital Stay: Payer: MEDICAID

## 2023-10-04 ENCOUNTER — Other Ambulatory Visit: Payer: Self-pay | Admitting: Oncology

## 2023-10-04 ENCOUNTER — Telehealth: Payer: Self-pay

## 2023-10-04 ENCOUNTER — Encounter: Payer: Self-pay | Admitting: Gastroenterology

## 2023-10-04 VITALS — BP 148/92 | HR 86 | Temp 98.5°F | Resp 16 | Wt 211.2 lb

## 2023-10-04 DIAGNOSIS — C83398 Diffuse large b-cell lymphoma of other extranodal and solid organ sites: Secondary | ICD-10-CM

## 2023-10-04 LAB — CMP (CANCER CENTER ONLY)
ALT: 116 U/L — ABNORMAL HIGH (ref 0–44)
AST: 49 U/L — ABNORMAL HIGH (ref 15–41)
Albumin: 3.4 g/dL — ABNORMAL LOW (ref 3.5–5.0)
Alkaline Phosphatase: 248 U/L — ABNORMAL HIGH (ref 38–126)
Anion gap: 10 (ref 5–15)
BUN: 25 mg/dL — ABNORMAL HIGH (ref 8–23)
CO2: 24 mmol/L (ref 22–32)
Calcium: 8.8 mg/dL — ABNORMAL LOW (ref 8.9–10.3)
Chloride: 97 mmol/L — ABNORMAL LOW (ref 98–111)
Creatinine: 0.93 mg/dL (ref 0.61–1.24)
GFR, Estimated: >60 mL/min (ref >60)
Glucose, Bld: 130 mg/dL — ABNORMAL HIGH (ref 70–99)
Potassium: 3.2 mmol/L — ABNORMAL LOW (ref 3.5–5.1)
Sodium: 131 mmol/L — ABNORMAL LOW (ref 135–145)
Total Bilirubin: 1.7 mg/dL — ABNORMAL HIGH (ref 0.0–1.2)
Total Protein: 6.5 g/dL (ref 6.5–8.1)

## 2023-10-04 LAB — CBC WITH DIFFERENTIAL (CANCER CENTER ONLY)
Abs Immature Granulocytes: 0.32 10*3/uL — ABNORMAL HIGH (ref 0.00–0.07)
Basophils Absolute: 0.1 10*3/uL (ref 0.0–0.1)
Basophils Relative: 0 %
Eosinophils Absolute: 0 10*3/uL (ref 0.0–0.5)
Eosinophils Relative: 0 %
HCT: 44 % (ref 39.0–52.0)
Hemoglobin: 15.3 g/dL (ref 13.0–17.0)
Immature Granulocytes: 2 %
Lymphocytes Relative: 8 %
Lymphs Abs: 1.4 10*3/uL (ref 0.7–4.0)
MCH: 30.3 pg (ref 26.0–34.0)
MCHC: 34.8 g/dL (ref 30.0–36.0)
MCV: 87.1 fL (ref 80.0–100.0)
Monocytes Absolute: 1 10*3/uL (ref 0.1–1.0)
Monocytes Relative: 5 %
Neutro Abs: 16.2 10*3/uL — ABNORMAL HIGH (ref 1.7–7.7)
Neutrophils Relative %: 85 %
Platelet Count: 489 10*3/uL — ABNORMAL HIGH (ref 150–400)
RBC: 5.05 MIL/uL (ref 4.22–5.81)
RDW: 14.2 % (ref 11.5–15.5)
WBC Count: 19 10*3/uL — ABNORMAL HIGH (ref 4.0–10.5)
nRBC: 0 % (ref 0.0–0.2)

## 2023-10-04 MED ORDER — SODIUM CHLORIDE 0.9 % IV SOLN
INTRAVENOUS | Status: DC
  Filled 2023-10-04 (×2): qty 250

## 2023-10-04 MED ORDER — DEXAMETHASONE SODIUM PHOSPHATE 10 MG/ML IJ SOLN
10.0000 mg | Freq: Once | INTRAMUSCULAR | Status: AC
  Administered 2023-10-04: 10 mg via INTRAVENOUS
  Filled 2023-10-04: qty 1

## 2023-10-04 MED ORDER — SODIUM CHLORIDE 0.9 % IV SOLN
375.0000 mg/m2 | Freq: Once | INTRAVENOUS | Status: AC
  Administered 2023-10-04: 800 mg via INTRAVENOUS
  Filled 2023-10-04: qty 50

## 2023-10-04 MED ORDER — HEPARIN SOD (PORK) LOCK FLUSH 100 UNIT/ML IV SOLN
500.0000 [IU] | Freq: Once | INTRAVENOUS | Status: AC | PRN
  Administered 2023-10-04: 500 [IU]
  Filled 2023-10-04: qty 5

## 2023-10-04 MED ORDER — DOXORUBICIN HCL CHEMO IV INJECTION 2 MG/ML
25.0000 mg/m2 | Freq: Once | INTRAVENOUS | Status: AC
  Administered 2023-10-04: 54 mg via INTRAVENOUS
  Filled 2023-10-04: qty 27

## 2023-10-04 MED ORDER — PALONOSETRON HCL INJECTION 0.25 MG/5ML
0.2500 mg | Freq: Once | INTRAVENOUS | Status: AC
  Administered 2023-10-04: 0.25 mg via INTRAVENOUS
  Filled 2023-10-04: qty 5

## 2023-10-04 MED ORDER — DIPHENHYDRAMINE HCL 25 MG PO CAPS
50.0000 mg | ORAL_CAPSULE | Freq: Once | ORAL | Status: AC
  Administered 2023-10-04: 50 mg via ORAL
  Filled 2023-10-04: qty 2

## 2023-10-04 MED ORDER — SODIUM CHLORIDE 0.9 % IV SOLN
750.0000 mg/m2 | Freq: Once | INTRAVENOUS | Status: AC
  Administered 2023-10-04: 1500 mg via INTRAVENOUS
  Filled 2023-10-04: qty 75

## 2023-10-04 MED ORDER — ACETAMINOPHEN 325 MG PO TABS
650.0000 mg | ORAL_TABLET | Freq: Once | ORAL | Status: AC
  Administered 2023-10-04: 650 mg via ORAL
  Filled 2023-10-04: qty 2

## 2023-10-04 MED ORDER — VINCRISTINE SULFATE CHEMO INJECTION 1 MG/ML
1.0000 mg | Freq: Once | INTRAVENOUS | Status: AC
  Administered 2023-10-04: 1 mg via INTRAVENOUS
  Filled 2023-10-04: qty 1

## 2023-10-04 MED ORDER — LEVOFLOXACIN 750 MG PO TABS: 750.0000 mg | ORAL_TABLET | Freq: Every day | ORAL | 0 refills | Status: DC

## 2023-10-04 MED ORDER — SODIUM CHLORIDE 0.9 % IV SOLN
150.0000 mg | Freq: Once | INTRAVENOUS | Status: AC
  Administered 2023-10-04: 150 mg via INTRAVENOUS
  Filled 2023-10-04: qty 5
  Filled 2023-10-04: qty 150

## 2023-10-04 NOTE — Telephone Encounter (Signed)
 Per Dr. Randy Buttery issue another refill of Levaquin for 14 days.  Refill issued and secure chat sent to Loren C in infusion to inform patient of the above.

## 2023-10-04 NOTE — Patient Instructions (Signed)

## 2023-10-04 NOTE — Patient Instructions (Signed)
 [facility] - A DEPT OF [facility]  Discharge Instructions: Thank you for choosing [facility] to provide your oncology and hematology care.  If you have a lab appointment with the Cancer Center, please go directly to the Cancer Center and check in at the registration area.  Wear comfortable clothing and clothing appropriate for easy access to any Portacath or PICC line.   We strive to give you quality time with your provider. You may need to reschedule your appointment if you arrive late (15 or more minutes).  Arriving late affects you and other patients whose appointments are after yours.  Also, if you miss three or more appointments without notifying the office, you may be dismissed from the clinic at the provider's discretion.      For prescription refill requests, have your pharmacy contact our office and allow 72 hours for refills to be completed.    Today you received the following chemotherapy and/or immunotherapy agents adriamycin /cytoxan /vincristine /rituxan    To help prevent nausea and vomiting after your treatment, we encourage you to take your nausea medication as directed.  BELOW ARE SYMPTOMS THAT SHOULD BE REPORTED IMMEDIATELY: *FEVER GREATER THAN 100.4 F (38 C) OR HIGHER *CHILLS OR SWEATING *NAUSEA AND VOMITING THAT IS NOT CONTROLLED WITH YOUR NAUSEA MEDICATION *UNUSUAL SHORTNESS OF BREATH *UNUSUAL BRUISING OR BLEEDING *URINARY PROBLEMS (pain or burning when urinating, or frequent urination) *BOWEL PROBLEMS (unusual diarrhea, constipation, pain near the anus) TENDERNESS IN MOUTH AND THROAT WITH OR WITHOUT PRESENCE OF ULCERS (sore throat, sores in mouth, or a toothache) UNUSUAL RASH, SWELLING OR PAIN  UNUSUAL VAGINAL DISCHARGE OR ITCHING   Items with * indicate a potential emergency and should be followed up as soon as possible or go to the Emergency Department if any problems should occur.  Please show the CHEMOTHERAPY  ALERT CARD or IMMUNOTHERAPY ALERT CARD at check-in to the Emergency Department and triage nurse.  Should you have questions after your visit or need to cancel or reschedule your appointment, please contact [facility] - A DEPT OF [facility]  [phone] and follow the prompts.  Office hours are 8:00 a.m. to 4:30 p.m. Monday - Friday. Please note that voicemails left after 4:00 p.m. may not be returned until the following business day.  We are closed weekends and major holidays. You have access to a nurse at all times for urgent questions. Please call the main number to the clinic [phone] and follow the prompts.  For any non-urgent questions, you may also contact your provider using MyChart. We now offer e-Visits for anyone 59 and older to request care online for non-urgent symptoms. For details visit [email or web address].   Also download the MyChart app! Go to the app store, search "MyChart", open the app, select [place], and log in with your MyChart username and password.

## 2023-10-05 ENCOUNTER — Encounter: Payer: Self-pay | Admitting: Oncology

## 2023-10-05 ENCOUNTER — Telehealth: Payer: Self-pay

## 2023-10-05 NOTE — Telephone Encounter (Signed)
Telephone call to patient for follow up after receiving first infusion.   Patient states infusion went great.  States eating good and drinking plenty of fluids.   Denies any nausea or vomiting.  Encouraged patient to call for any concerns or questions. 

## 2023-10-06 ENCOUNTER — Inpatient Hospital Stay: Payer: MEDICAID

## 2023-10-06 DIAGNOSIS — C83398 Diffuse large b-cell lymphoma of other extranodal and solid organ sites: Secondary | ICD-10-CM

## 2023-10-06 MED ORDER — PEGFILGRASTIM-FPGK 6 MG/0.6ML ~~LOC~~ SOSY
6.0000 mg | PREFILLED_SYRINGE | Freq: Once | SUBCUTANEOUS | Status: AC
  Administered 2023-10-06: 6 mg via SUBCUTANEOUS
  Filled 2023-10-06: qty 0.6

## 2023-10-08 NOTE — Anesthesia Postprocedure Evaluation (Signed)
 Anesthesia Post Note  Patient: Joel [name]  Procedure(s) Performed: ERCP, WITH INTERVENTION IF INDICATED  Patient location during evaluation: Endoscopy Anesthesia Type: General Level of consciousness: awake and alert Pain management: pain level controlled Vital Signs Assessment: post-procedure vital signs reviewed and stable Respiratory status: spontaneous breathing, nonlabored ventilation, respiratory function stable and patient connected to nasal cannula oxygen Cardiovascular status: blood pressure returned to baseline and stable Postop Assessment: no apparent nausea or vomiting Anesthetic complications: no   No notable events documented.   Last Vitals:  Vitals:   10/03/23 1253 10/03/23 1300  BP: (!) 183/100 (!) 177/97  Pulse: 65 67  Resp: 20 (!) 21  Temp:    SpO2: 99% 97%    Last Pain:  Vitals:   10/04/23 0745  TempSrc:   PainSc: 0-No pain                 Vanice Genre

## 2023-10-09 ENCOUNTER — Encounter: Payer: Self-pay | Admitting: Oncology

## 2023-10-09 ENCOUNTER — Inpatient Hospital Stay (HOSPITAL_BASED_OUTPATIENT_CLINIC_OR_DEPARTMENT_OTHER): Payer: MEDICAID | Admitting: Hospice and Palliative Medicine

## 2023-10-09 ENCOUNTER — Inpatient Hospital Stay: Payer: MEDICAID | Attending: Oncology

## 2023-10-09 ENCOUNTER — Encounter: Payer: Self-pay | Admitting: Hospice and Palliative Medicine

## 2023-10-09 ENCOUNTER — Inpatient Hospital Stay: Payer: MEDICAID

## 2023-10-09 VITALS — BP 120/54 | HR 94 | Temp 98.8°F | Resp 15 | Wt 210.0 lb

## 2023-10-09 DIAGNOSIS — Z5189 Encounter for other specified aftercare: Secondary | ICD-10-CM | POA: Insufficient documentation

## 2023-10-09 DIAGNOSIS — C83398 Diffuse large b-cell lymphoma of other extranodal and solid organ sites: Secondary | ICD-10-CM

## 2023-10-09 DIAGNOSIS — Z5112 Encounter for antineoplastic immunotherapy: Secondary | ICD-10-CM | POA: Insufficient documentation

## 2023-10-09 DIAGNOSIS — Z79620 Long term (current) use of immunosuppressive biologic: Secondary | ICD-10-CM | POA: Insufficient documentation

## 2023-10-09 DIAGNOSIS — Z452 Encounter for adjustment and management of vascular access device: Secondary | ICD-10-CM | POA: Insufficient documentation

## 2023-10-09 DIAGNOSIS — Z5111 Encounter for antineoplastic chemotherapy: Secondary | ICD-10-CM | POA: Insufficient documentation

## 2023-10-09 LAB — CBC WITH DIFFERENTIAL (CANCER CENTER ONLY)
Abs Immature Granulocytes: 0 10*3/uL (ref 0.00–0.07)
Basophils Absolute: 0 10*3/uL (ref 0.0–0.1)
Basophils Relative: 0 %
Eosinophils Absolute: 0 10*3/uL (ref 0.0–0.5)
Eosinophils Relative: 0 %
HCT: 40.5 % (ref 39.0–52.0)
Hemoglobin: 13.8 g/dL (ref 13.0–17.0)
Lymphocytes Relative: 5 %
Lymphs Abs: 1.2 10*3/uL (ref 0.7–4.0)
MCH: 30.4 pg (ref 26.0–34.0)
MCHC: 34.1 g/dL (ref 30.0–36.0)
MCV: 89.2 fL (ref 80.0–100.0)
Monocytes Absolute: 0.2 10*3/uL (ref 0.1–1.0)
Monocytes Relative: 1 %
Neutro Abs: 22.7 10*3/uL — ABNORMAL HIGH (ref 1.7–7.7)
Neutrophils Relative %: 94 %
Platelet Count: 227 10*3/uL (ref 150–400)
RBC: 4.54 MIL/uL (ref 4.22–5.81)
RDW: 14.2 % (ref 11.5–15.5)
Smear Review: NORMAL
WBC Count: 24.1 10*3/uL — ABNORMAL HIGH (ref 4.0–10.5)
nRBC: 0 % (ref 0.0–0.2)

## 2023-10-09 LAB — CMP (CANCER CENTER ONLY)
ALT: 64 U/L — ABNORMAL HIGH (ref 0–44)
AST: 48 U/L — ABNORMAL HIGH (ref 15–41)
Albumin: 3.2 g/dL — ABNORMAL LOW (ref 3.5–5.0)
Alkaline Phosphatase: 265 U/L — ABNORMAL HIGH (ref 38–126)
Anion gap: 10 (ref 5–15)
BUN: 21 mg/dL (ref 8–23)
CO2: 22 mmol/L (ref 22–32)
Calcium: 8.3 mg/dL — ABNORMAL LOW (ref 8.9–10.3)
Chloride: 100 mmol/L (ref 98–111)
Creatinine: 0.73 mg/dL (ref 0.61–1.24)
GFR, Estimated: >60 mL/min (ref >60)
Glucose, Bld: 106 mg/dL — ABNORMAL HIGH (ref 70–99)
Potassium: 3.6 mmol/L (ref 3.5–5.1)
Sodium: 132 mmol/L — ABNORMAL LOW (ref 135–145)
Total Bilirubin: 1.3 mg/dL — ABNORMAL HIGH (ref 0.0–1.2)
Total Protein: 6.3 g/dL — ABNORMAL LOW (ref 6.5–8.1)

## 2023-10-09 MED ORDER — ALTEPLASE 2 MG IJ SOLR
2.0000 mg | Freq: Once | INTRAMUSCULAR | Status: AC | PRN
  Administered 2023-10-09: 2 mg
  Filled 2023-10-09: qty 2

## 2023-10-09 MED ORDER — HEPARIN SOD (PORK) LOCK FLUSH 100 UNIT/ML IV SOLN
500.0000 [IU] | Freq: Once | INTRAVENOUS | Status: AC | PRN
  Administered 2023-10-09: 500 [IU]
  Filled 2023-10-09: qty 5

## 2023-10-09 MED ORDER — SODIUM CHLORIDE 0.9% FLUSH
10.0000 mL | INTRAVENOUS | Status: DC | PRN
  Filled 2023-10-09: qty 10

## 2023-10-09 NOTE — Progress Notes (Signed)
 Symptom Management Clinic [facility] at [facility] Telephone:([phone]) [phone] [phone]  Patient Care Team: Yehuda Helms, MD as PCP - General (Internal Medicine) Avonne Boettcher, MD as Consulting Physician (Oncology)   NAME OF PATIENT: Joel [name]  [id number]  14-Nov-1960   DATE OF VISIT: 10/09/23  REASON FOR CONSULT: FRIEND DORFMAN is a 63 y.o. male with multiple medical problems including stage IV diffuse large B-cell lymphoma.   INTERVAL HISTORY: Patient saw Dr. Randy Buttery on 09/29/2023 at which time patient had chills but no fever.  There was concern for possible cholangitis/stent occlusion and patient was started on Levaquin  and sent for repeat ERCP and stent exchange on 10/03/2023.  Patient received cycle 1 R-CHOP 10/04/2023.  Patient returns to clinic today for repeat labs and supportive care.  Today, patient reports she is doing reasonably well.  Denies significant changes or concerns.  States that he has felt intermittently weak but denies falls.  Has maintained normal level of functioning walked half a mile yesterday.  Denies fever or chills.  Reports good oral intake.  Denies any neurologic complaints. Denies recent fevers or illnesses. Denies any easy bleeding or bruising. Reports good appetite and denies weight loss. Denies chest pain. Denies any nausea, vomiting, constipation, or diarrhea. Denies urinary complaints. Patient offers no further specific complaints today.   PAST MEDICAL HISTORY: 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    GERD (gastroesophageal reflux disease)    Headache    Hyperlipidemia    Hypertension    Statin myopathy    Stroke (HCC)    Thyroid  nodule     PAST SURGICAL HISTORY:  Past Surgical History:  Procedure Laterality Date   COLONOSCOPY N/A  08/18/2023   Procedure: COLONOSCOPY;  Surgeon: Shane Darling, MD;  Location: [facility] ENDOSCOPY;  Service: Endoscopy;  Laterality: N/A;   ERCP N/A 08/23/2023   Procedure: ERCP, WITH INTERVENTION IF INDICATED;  Surgeon: Marnee Sink, MD;  Location: [facility] ENDOSCOPY;  Service: Endoscopy;  Laterality: N/A;   ERCP N/A 10/03/2023   Procedure: ERCP, WITH INTERVENTION IF INDICATED;  Surgeon: Marnee Sink, MD;  Location: [facility] ENDOSCOPY;  Service: Endoscopy;  Laterality: N/A;   MOHS SURGERY     2025, left cheek   POLYPECTOMY  08/18/2023   Procedure: POLYPECTOMY, INTESTINE;  Surgeon: Shane Darling, MD;  Location: [facility] ENDOSCOPY;  Service: Endoscopy;;   TONSILLECTOMY     1968    HEMATOLOGY/ONCOLOGY HISTORY:  Oncology History  Diffuse large B-cell lymphoma of solid organ excluding spleen  09/20/2023 Initial Diagnosis   DLBCL (diffuse large B cell lymphoma) (HCC)   09/20/2023 Cancer Staging   Staging form: Hodgkin and Non-Hodgkin Lymphoma, AJCC 8th Edition - Clinical stage from 09/20/2023: Stage IV (Diffuse large B-cell lymphoma) - Signed by Avonne Boettcher, MD on 09/20/2023 Stage prefix: Initial diagnosis   10/04/2023 -  Chemotherapy   Patient is on Treatment Plan : NON-HODGKINS LYMPHOMA R-CHOP q21d       ALLERGIES:  is allergic to hydrocodone-acetaminophen  and penicillins.  MEDICATIONS:  Current Outpatient Medications  Medication Sig Dispense Refill   acyclovir  (ZOVIRAX ) 400 MG tablet Take 1 tablet (400 mg total) by mouth daily. 30 tablet 3   allopurinol  (ZYLOPRIM ) 300 MG tablet Take 1 tablet (300 mg total) by mouth daily. 30 tablet 3  ALPRAZolam (XANAX) 1 MG tablet Take 0.5 mg by mouth daily as needed for anxiety.     amphetamine-dextroamphetamine (ADDERALL XR) 20 MG 24 hr capsule Take 20 mg by mouth daily.     aspirin 81 MG chewable tablet Chew 81 mg by mouth daily.     atorvastatin (LIPITOR) 40 MG tablet Take 40 mg by mouth daily. (Patient not taking: Reported on 08/23/2023)      buPROPion (WELLBUTRIN XL) 300 MG 24 hr tablet Take 300 mg by mouth daily.     ezetimibe (ZETIA) 10 MG tablet Take 1 tablet by mouth daily.     gabapentin  (NEURONTIN ) 100 MG capsule Take 1 capsule (100 mg total) by mouth 3 (three) times daily. (Patient not taking: Reported on 09/20/2023) 30 capsule 0   levofloxacin  (LEVAQUIN ) 750 MG tablet Take 1 tablet (750 mg total) by mouth daily. 14 tablet 0   lidocaine -prilocaine  (EMLA ) cream Apply to affected area once 30 g 3   naproxen  (NAPROSYN ) 500 MG tablet Take 1 tablet (500 mg total) by mouth 2 (two) times daily with a meal. 20 tablet 0   omeprazole (PRILOSEC) 40 MG capsule Take 40 mg by mouth daily.     ondansetron  (ZOFRAN  ODT) 4 MG disintegrating tablet Take 1 tablet (4 mg total) by mouth every 8 (eight) hours as needed for nausea or vomiting. 20 tablet 0   ondansetron  (ZOFRAN ) 8 MG tablet Take 1 tablet (8 mg total) by mouth every 8 (eight) hours as needed for nausea or vomiting. Start on the third day after cyclophosphamide  chemotherapy. 30 tablet 1   prochlorperazine  (COMPAZINE ) 10 MG tablet Take 1 tablet (10 mg total) by mouth every 6 (six) hours as needed for nausea or vomiting. 30 tablet 6   telmisartan -hydrochlorothiazide  (MICARDIS  HCT) 80-25 MG tablet Take 1 tablet by mouth daily. 90 tablet 0   traZODone (DESYREL) 50 MG tablet Take 50 mg by mouth at bedtime.     No current facility-administered medications for this visit.    VITAL SIGNS: There were no vitals taken for this visit. There were no vitals filed for this visit.  Estimated body mass index is 34.09 kg/m as calculated from the following:   Height as of 10/03/23: 5\' 6"  (1.676 m).   Weight as of 10/04/23: 211 lb 3.2 oz (95.8 kg).  LABS: CBC:    Component Value Date/Time   WBC 19.0 (H) 10/04/2023 0850   WBC 7.6 07/20/2023 0540   HGB 15.3 10/04/2023 0850   HCT 44.0 10/04/2023 0850   PLT 489 (H) 10/04/2023 0850   MCV 87.1 10/04/2023 0850   NEUTROABS 16.2 (H) 10/04/2023 0850    LYMPHSABS 1.4 10/04/2023 0850   MONOABS 1.0 10/04/2023 0850   EOSABS 0.0 10/04/2023 0850   BASOSABS 0.1 10/04/2023 0850   Comprehensive Metabolic Panel:    Component Value Date/Time   NA 131 (L) 10/04/2023 0850   K 3.2 (L) 10/04/2023 0850   CL 97 (L) 10/04/2023 0850   CO2 24 10/04/2023 0850   BUN 25 (H) 10/04/2023 0850   CREATININE 0.93 10/04/2023 0850   GLUCOSE 130 (H) 10/04/2023 0850   CALCIUM 8.8 (L) 10/04/2023 0850   AST 49 (H) 10/04/2023 0850   ALT 116 (H) 10/04/2023 0850   ALKPHOS 248 (H) 10/04/2023 0850   BILITOT 1.7 (H) 10/04/2023 0850   PROT 6.5 10/04/2023 0850   ALBUMIN 3.4 (L) 10/04/2023 0850    RADIOGRAPHIC STUDIES: DG C-Arm 1-60 Min-No Report Result Date: 10/03/2023 Fluoroscopy was utilized by the  requesting physician.  No radiographic interpretation.   NM Cardiac Muga Rest Result Date: 09/27/2023 CLINICAL DATA:  Kl[place]kin's tumor EXAM: NUCLEAR MEDICINE CARDIAC BLOOD POOL IMAGING (MUGA) TECHNIQUE: Cardiac multi-gated acquisition was performed at rest following intravenous injection of Tc-43m labeled red blood cells. RADIOPHARMACEUTICALS:  21.5 mCi Tc-35m pertechnetate in-vitro labeled red blood cells IV COMPARISON:  PET-CT, 09/25/2023 FINDINGS: The LEFT ventricular wall is normal in size and demonstrate normal wall motion. Calculated LEFT ventricular ejection fraction; 55% IMPRESSION: 1. Normal LEFT ventricular size and wall motion. 2. Calculated LVEF 55%. Electronically Signed   By: Art Largo M.D.   On: 09/27/2023 17:58   NM PET Image Initial (PI) Skull Base To Thigh Result Date: 09/25/2023 CLINICAL DATA:  Initial treatment strategy for diffuse large B-cell lymphoma. EXAM: NUCLEAR MEDICINE PET SKULL BASE TO THIGH TECHNIQUE: 11.93 mCi F-18 FDG was injected intravenously. Full-ring PET imaging was performed from the skull base to thigh after the radiotracer. CT data was obtained and used for attenuation correction and anatomic localization. Fasting blood glucose: 92  mg/dl COMPARISON:  MRI 16/02/9603 FINDINGS: Mediastinal blood pool activity: SUV max 2.8 Liver activity: SUV max 4.0 NECK: No hypermetabolic lymph nodes in the neck. Incidental CT findings: Tracer avid nodule within the inferior pole of right lobe of thyroid  gland measures 2.1 cm and has an SUV max 6.6, image 37. CHEST: No tracer avid axillary or supraclavicular lymph nodes. No tracer avid mediastinal or hilar lymph nodes. Within the far right lateral cardiophrenic angle fat there is a tracer avid lymph node which measures 0.6 cm with SUV max of 9.2, image 72. No tracer avid pulmonary nodules identified. Incidental CT findings: Aortic atherosclerosis and multi vessel coronary artery calcification. ABDOMEN/PELVIS: Tracer avid mass involving segments 8, 5, 4, and 0 is identified. The dominant component involving segments 4 and 8 measures 8.7 x 7.5 cm and has an SUV max of 13.6, image 74. As noted on the recent MRI the tumor extends around the gallbladder and obstructs the confluence of the bile ducts. No abnormal tracer uptake within the pancreas. No abnormal uptake within the spleen. The spleen is normal in size measuring 12.4 cm in length. Normal appearance of the adrenal glands. Tracer avid abdominal lymph nodes identified. Index lymph node adjacent to the caudate lobe measures 0.6 cm and has an SUV max of 5.6, image 80. Retrocaval lymph node at the level of the right renal vein measures 1.3 cm with SUV max of 10.4, image 93. No tracer avid pelvic or inguinal lymph nodes. Incidental CT findings: Mild aortic atherosclerosis. Interval placement of common bile duct stent. SKELETON: No focal hypermetabolic activity to suggest skeletal metastasis. Incidental CT findings: None. IMPRESSION: 1. Tracer avid mass involving segments 8, 5, 4, and 0 of the liver is identified compatible with biopsy-proven lymphoma. The dominant component involving segments 4 and 8 measures 8.7 x 7.5 cm and has an SUV max of 13.6. 2. Tracer avid  abdominal lymph nodes compatible with lymphoma. 3. Tracer avid nodule within the inferior pole of right lobe of thyroid  gland measures 2.1 cm and has an SUV max 6.6. Recommend thyroid  US  and biopsy (ref: J Am Coll Radiol. 2015 Feb;12(2): 143-50). 4.  Aortic Atherosclerosis (ICD10-I70.0). Electronically Signed   By: Kimberley Penman M.D.   On: 09/25/2023 12:53    PERFORMANCE STATUS (ECOG) : 1 - Symptomatic but completely ambulatory  Review of Systems Unless otherwise noted, a complete review of systems is negative.  Physical Exam General: NAD Cardiovascular: regular rate  and rhythm Pulmonary: clear anterior/posterior fields Abdomen: soft, nontender, + bowel sounds GU: no suprapubic tenderness Extremities: no edema, no joint deformities Skin: no rashes Neurological: nonfocal  IMPRESSION/PLAN: DLBCL -status post cycle 1 R-CHOP.  Labs today show improving transaminases, alk phos, and bilirubin.  Potassium has normalized.  No other significant electrolyte derangements.  Vital stable and patient is nontoxic-appearing.  Overall, patient feels good and denies significant symptomatic complaints or concerns.  No need for IV fluids today.  Patient to return next week for labs and possible supportive care.  ED triggers reviewed in detail.  Case and plan discussed Dr. Randy Buttery.   Patient expressed understanding and was in agreement with this plan. He also understands that He can call clinic at any time with any questions, concerns, or complaints.   Thank you for allowing me to participate in the care of this very pleasant patient.   Time Total: 20 minutes  Visit consisted of counseling and education dealing with the complex and emotionally intense issues of symptom management in the setting of serious illness.Greater than 50%  of this time was spent counseling and coordinating care related to the above assessment and plan.  Signed by: Gerilyn Kobus, PhD, NP-C

## 2023-10-09 NOTE — Progress Notes (Signed)
 No IV fluids needed today Port Heparin  locked and needle removed.

## 2023-10-12 ENCOUNTER — Emergency Department
Admission: EM | Admit: 2023-10-12 | Discharge: 2023-10-12 | Disposition: A | Discharge status: Home / Self Care | Payer: Self-pay | Attending: Emergency Medicine | Admitting: Emergency Medicine

## 2023-10-12 ENCOUNTER — Emergency Department: Payer: Self-pay

## 2023-10-12 ENCOUNTER — Other Ambulatory Visit: Payer: Self-pay

## 2023-10-12 ENCOUNTER — Telehealth: Payer: Self-pay | Admitting: *Deleted

## 2023-10-12 DIAGNOSIS — R6883 Chills (without fever): Secondary | ICD-10-CM

## 2023-10-12 DIAGNOSIS — M546 Pain in thoracic spine: Secondary | ICD-10-CM

## 2023-10-12 DIAGNOSIS — J988 Other specified respiratory disorders: Secondary | ICD-10-CM

## 2023-10-12 DIAGNOSIS — R682 Dry mouth, unspecified: Secondary | ICD-10-CM

## 2023-10-12 DIAGNOSIS — R079 Chest pain, unspecified: Secondary | ICD-10-CM

## 2023-10-12 DIAGNOSIS — R350 Frequency of micturition: Secondary | ICD-10-CM | POA: Insufficient documentation

## 2023-10-12 DIAGNOSIS — R0981 Nasal congestion: Secondary | ICD-10-CM | POA: Insufficient documentation

## 2023-10-12 DIAGNOSIS — J449 Chronic obstructive pulmonary disease, unspecified: Secondary | ICD-10-CM | POA: Insufficient documentation

## 2023-10-12 LAB — URINALYSIS, W/ REFLEX TO CULTURE (INFECTION SUSPECTED)
Bacteria, UA: NONE SEEN
Bilirubin Urine: NEGATIVE
Glucose, UA: NEGATIVE mg/dL
Hgb urine dipstick: NEGATIVE
Ketones, ur: NEGATIVE mg/dL
Leukocytes,Ua: NEGATIVE
Nitrite: NEGATIVE
Protein, ur: NEGATIVE mg/dL
Specific Gravity, Urine: 1.009 (ref 1.005–1.030)
Squamous Epithelial / HPF: 0 /HPF (ref 0–5)
pH: 7 (ref 5.0–8.0)

## 2023-10-12 LAB — COMPREHENSIVE METABOLIC PANEL WITH GFR
ALT: 52 U/L — ABNORMAL HIGH (ref 0–44)
AST: 48 U/L — ABNORMAL HIGH (ref 15–41)
Albumin: 3.8 g/dL (ref 3.5–5.0)
Alkaline Phosphatase: 232 U/L — ABNORMAL HIGH (ref 38–126)
Anion gap: 11 (ref 5–15)
BUN: 13 mg/dL (ref 8–23)
CO2: 21 mmol/L — ABNORMAL LOW (ref 22–32)
Calcium: 9.6 mg/dL (ref 8.9–10.3)
Chloride: 101 mmol/L (ref 98–111)
Creatinine, Ser: 0.85 mg/dL (ref 0.61–1.24)
GFR, Estimated: >60 mL/min (ref >60)
Glucose, Bld: 154 mg/dL — ABNORMAL HIGH (ref 70–99)
Potassium: 3.4 mmol/L — ABNORMAL LOW (ref 3.5–5.1)
Sodium: 133 mmol/L — ABNORMAL LOW (ref 135–145)
Total Bilirubin: 1.1 mg/dL (ref 0.0–1.2)
Total Protein: 6.5 g/dL (ref 6.5–8.1)

## 2023-10-12 LAB — CBC
HCT: 39.5 % (ref 39.0–52.0)
Hemoglobin: 13.5 g/dL (ref 13.0–17.0)
MCH: 30.5 pg (ref 26.0–34.0)
MCHC: 34.2 g/dL (ref 30.0–36.0)
MCV: 89.2 fL (ref 80.0–100.0)
Platelets: 225 10*3/uL (ref 150–400)
RBC: 4.43 MIL/uL (ref 4.22–5.81)
RDW: 13.7 % (ref 11.5–15.5)
WBC: 6 10*3/uL (ref 4.0–10.5)
nRBC: 0 % (ref 0.0–0.2)

## 2023-10-12 LAB — RESP PANEL BY RT-PCR (RSV, FLU A&B, COVID)  RVPGX2
Influenza A by PCR: NEGATIVE
Influenza B by PCR: NEGATIVE
Resp Syncytial Virus by PCR: NEGATIVE
SARS Coronavirus 2 by RT PCR: NEGATIVE

## 2023-10-12 LAB — TROPONIN I (HIGH SENSITIVITY): Troponin I (High Sensitivity): 15 ng/L (ref <18)

## 2023-10-12 LAB — D-DIMER, QUANTITATIVE: D-Dimer, Quant: 0.5 ug{FEU}/mL (ref 0.00–0.50)

## 2023-10-12 MED ORDER — LIDOCAINE 5 % EX PTCH: 1.0000 | MEDICATED_PATCH | CUTANEOUS | 0 refills | Status: AC

## 2023-10-12 NOTE — ED Notes (Signed)
 Pt provided 20 oz Gatorlyte per Drenda Gentle, MD, order.

## 2023-10-12 NOTE — Telephone Encounter (Signed)
 Patient called and said that he is having some of the ED triggers nd he is in the ED and wanted MD to know.

## 2023-10-12 NOTE — ED Notes (Signed)
 Lab called to collect D-dimer

## 2023-10-12 NOTE — ED Provider Notes (Signed)
 [facility] Provider Note    Event Date/Time   First MD Initiated Contact with Patient 10/12/23 1756     (approximate)   History   multple complaints, Chest Pain, and Shortness of Breath   HPI  Joel [name] is a 63 y.o. male with history of COPD, GERD, diffuse large B-cell lymphoma on chemo presenting with chest pain, dry mouth, chills, back pain.  He has had intermittent cough and congestion, states that he has had urinary frequency but no dysuria, no nausea, vomiting, diarrhea.  No abdominal pain.  No new weakness or numbness.   On independent chart review, he was seen by his oncologist on 2 June, has history of cholangitis/stent occlusion, had a stent exchange and was started on Levaquin .  Received 1 cycle of CHOP on May 28.  Physical Exam   Triage Vital Signs: ED Triage Vitals  Encounter Vitals Group     BP 10/12/23 1625 (!) 121/90     Systolic BP Percentile --      Diastolic BP Percentile --      Pulse Rate 10/12/23 1625 (!) 110     Resp 10/12/23 1625 16     Temp 10/12/23 1625 98.3 F (36.8 C)     Temp Source 10/12/23 1625 Oral     SpO2 10/12/23 1625 96 %     Weight 10/12/23 1631 213 lb (96.6 kg)     Height 10/12/23 1631 5' 6.5" (1.689 m)     Head Circumference --      Peak Flow --      Pain Score 10/12/23 1627 4     Pain Loc --      Pain Education --      Exclude from Growth Chart --     Most recent vital signs: Vitals:   10/12/23 1625  BP: (!) 121/90  Pulse: (!) 110  Resp: 16  Temp: 98.3 F (36.8 C)  SpO2: 96%     General: Awake, no distress.  CV:  Good peripheral perfusion.  Resp:  Normal effort.  No respiratory distress or increased work of breathing Abd:  No distention.  Soft nontender Other:  No jaundice, no intraoral lesions, no obvious rash, no midline spinal tenderness, no saddle anesthesia, ambulatory with steady gait, no focal weakness or numbness.  He has some mild right parathoracic tenderness on  palpation.   ED Results / Procedures / Treatments   Labs (all labs ordered are listed, but only abnormal results are displayed) Labs Reviewed  COMPREHENSIVE METABOLIC PANEL WITH GFR - Abnormal; Notable for the following components:      Result Value   Sodium 133 (*)    Potassium 3.4 (*)    CO2 21 (*)    Glucose, Bld 154 (*)    AST 48 (*)    ALT 52 (*)    Alkaline Phosphatase 232 (*)    All other components within normal limits  URINALYSIS, W/ REFLEX TO CULTURE (INFECTION SUSPECTED) - Abnormal; Notable for the following components:   Color, Urine YELLOW (*)    APPearance CLEAR (*)    All other components within normal limits  RESP PANEL BY RT-PCR (RSV, FLU A&B, COVID)  [id number]  CBC  D-DIMER, QUANTITATIVE  TROPONIN I (HIGH SENSITIVITY)  TROPONIN I (HIGH SENSITIVITY)     EKG  EKG shows, sinus tachycardia, rate 105, normal QRS, normal QTc, no ischemic ST elevation, T wave inversion to aVL, this is new compared to prior.  RADIOLOGY On my independent interpretation, chest x-ray without obvious consolidation   PROCEDURES:  Critical Care performed: No  Procedures   MEDICATIONS ORDERED IN ED: Medications - No data to display   IMPRESSION / MDM / ASSESSMENT AND PLAN / ED COURSE  I reviewed the triage vital signs and the nursing notes.                              Differential diagnosis includes, but is not limited to, ACS, viral illness, pneumonia, UTI, musculoskeletal pain, strain, consider cauda equina but he has no saddle anesthesia or incontinence, considered mass to his spine but he has no midline spinal tenderness, his pain is mostly paraspinal.  Considered biliary obstruction but his LFTs are improving compared to prior, T. bili is not elevated.  He is also not jaundiced nor does he have any abdominal pain or altered mental status.  Also considered PE given the tachycardia and chest pain, will get a D-dimer.  Labs, UA, EKG, troponin, chest x-ray, p.o. fluids,  reassess.  Patient's presentation is most consistent with acute presentation with potential threat to life or bodily function.  Independent interpretation of labs and imaging below.  UA is not consistent with UTI, respiratory viral panel is negative, chest x-ray without evidence of pneumonia, is no leukocytosis, electrolytes not severely deranged, D-dimer is not elevated, troponin is not elevated, patient is asking to be discharged and will follow-up with primary care doctor as well as oncologist outpatient.  Considered but no indication for inpatient admission at this time, he safe for outpatient management.  Will instruct him to call his oncologist office tomorrow to follow-up either this week or early next week.  Will give him some Lidoderm  patches for home, discharged with strict return precautions.    Clinical Course as of 10/12/23 2017  Thu Oct 12, 2023  1912 Independent review of labs, electrolytes are not severely deranged, LFTs are mildly elevated but this is improving compared to prior, no leukocytosis, troponin is negative.  UA is not consistent with UTI. [TT]  1917 DG Chest 1 View No active disease.  [TT]  2014 D-dimer, quantitative Not elevated [TT]    Clinical Course User Index [TT] Shane Darling, MD     FINAL CLINICAL IMPRESSION(S) / ED DIAGNOSES   Final diagnoses:  Chest pain, unspecified type  Acute right-sided thoracic back pain  Chills  Congestion of upper airway  Dry mouth     Rx / DC Orders   ED Discharge Orders          Ordered    lidocaine  (LIDODERM ) 5 %  Every 24 hours        10/12/23 2015             Note:  This document was prepared using Dragon voice recognition software and may include unintentional dictation errors.    Shane Darling, MD 10/12/23 2017

## 2023-10-12 NOTE — Discharge Instructions (Signed)
 Make sure to call your oncologist office tomorrow to follow-up.  You can take Tylenol  650 mg every 6 hours as needed for the back pain.  I have also prescribed you some Lidoderm  patches.

## 2023-10-12 NOTE — ED Triage Notes (Signed)
 Pt arrived via POV and is a cancer patient that has had one chemo treatment and is experiencing symptoms that pt was told to monitor for that includes severe dry mouth, CP, SOB, spinal pain, feeling of cold chills/sweat, and cracking at the corners of their mouth. Most symptoms started today and some last night. Pt is able to speak in full sentences and appears in NAD. Pt is A&Ox4 during triage.

## 2023-10-13 ENCOUNTER — Encounter: Payer: Self-pay | Admitting: Hospice and Palliative Medicine

## 2023-10-13 ENCOUNTER — Encounter: Payer: Self-pay | Admitting: Oncology

## 2023-10-16 ENCOUNTER — Inpatient Hospital Stay: Payer: MEDICAID

## 2023-10-16 DIAGNOSIS — C83398 Diffuse large b-cell lymphoma of other extranodal and solid organ sites: Secondary | ICD-10-CM

## 2023-10-16 DIAGNOSIS — Z452 Encounter for adjustment and management of vascular access device: Secondary | ICD-10-CM

## 2023-10-16 LAB — CBC WITH DIFFERENTIAL (CANCER CENTER ONLY)
Abs Immature Granulocytes: 3.25 10*3/uL — ABNORMAL HIGH (ref 0.00–0.07)
Basophils Absolute: 0.1 10*3/uL (ref 0.0–0.1)
Basophils Relative: 1 %
Eosinophils Absolute: 0.1 10*3/uL (ref 0.0–0.5)
Eosinophils Relative: 1 %
HCT: 42.7 % (ref 39.0–52.0)
Hemoglobin: 14.5 g/dL (ref 13.0–17.0)
Immature Granulocytes: 13 %
Lymphocytes Relative: 6 %
Lymphs Abs: 1.6 10*3/uL (ref 0.7–4.0)
MCH: 30.7 pg (ref 26.0–34.0)
MCHC: 34 g/dL (ref 30.0–36.0)
MCV: 90.3 fL (ref 80.0–100.0)
Monocytes Absolute: 1.6 10*3/uL — ABNORMAL HIGH (ref 0.1–1.0)
Monocytes Relative: 6 %
Neutro Abs: 18.9 10*3/uL — ABNORMAL HIGH (ref 1.7–7.7)
Neutrophils Relative %: 73 %
Platelet Count: 237 10*3/uL (ref 150–400)
RBC: 4.73 MIL/uL (ref 4.22–5.81)
RDW: 14.6 % (ref 11.5–15.5)
Smear Review: NORMAL
WBC Count: 25.6 10*3/uL — ABNORMAL HIGH (ref 4.0–10.5)
nRBC: 0.1 % (ref 0.0–0.2)

## 2023-10-16 LAB — CMP (CANCER CENTER ONLY)
ALT: 35 U/L (ref 0–44)
AST: 32 U/L (ref 15–41)
Albumin: 3.9 g/dL (ref 3.5–5.0)
Alkaline Phosphatase: 218 U/L — ABNORMAL HIGH (ref 38–126)
Anion gap: 8 (ref 5–15)
BUN: 13 mg/dL (ref 8–23)
CO2: 25 mmol/L (ref 22–32)
Calcium: 9 mg/dL (ref 8.9–10.3)
Chloride: 101 mmol/L (ref 98–111)
Creatinine: 0.9 mg/dL (ref 0.61–1.24)
GFR, Estimated: >60 mL/min (ref >60)
Glucose, Bld: 124 mg/dL — ABNORMAL HIGH (ref 70–99)
Potassium: 3.8 mmol/L (ref 3.5–5.1)
Sodium: 134 mmol/L — ABNORMAL LOW (ref 135–145)
Total Bilirubin: 0.7 mg/dL (ref 0.0–1.2)
Total Protein: 7.2 g/dL (ref 6.5–8.1)

## 2023-10-16 MED ORDER — HEPARIN SOD (PORK) LOCK FLUSH 100 UNIT/ML IV SOLN
500.0000 [IU] | Freq: Once | INTRAVENOUS | Status: AC
  Administered 2023-10-16: 500 [IU] via INTRAVENOUS
  Filled 2023-10-16: qty 5

## 2023-10-16 MED ORDER — SODIUM CHLORIDE 0.9% FLUSH
10.0000 mL | Freq: Once | INTRAVENOUS | Status: AC
  Administered 2023-10-16: 10 mL via INTRAVENOUS
  Filled 2023-10-16: qty 10

## 2023-10-16 NOTE — Progress Notes (Signed)
 NO IVF needed today

## 2023-10-17 NOTE — Telephone Encounter (Signed)
 Left message on voicemail.

## 2023-10-17 NOTE — Telephone Encounter (Signed)
 ERCP scheduled an PPW released via Mychart and copy mailed to pt

## 2023-10-19 ENCOUNTER — Encounter: Payer: Self-pay | Admitting: Oncology

## 2023-10-19 ENCOUNTER — Encounter: Payer: Self-pay | Admitting: Hospice and Palliative Medicine

## 2023-10-20 ENCOUNTER — Telehealth: Payer: Self-pay | Admitting: *Deleted

## 2023-10-20 ENCOUNTER — Other Ambulatory Visit: Payer: Self-pay

## 2023-10-20 DIAGNOSIS — K831 Obstruction of bile duct: Secondary | ICD-10-CM

## 2023-10-20 MED ORDER — LACTULOSE 10 GM/15ML PO SOLN: 10.0000 g | ORAL | 0 refills | Status: DC | PRN

## 2023-10-20 NOTE — Telephone Encounter (Signed)
 Has been very constipation and he has tried Colace he is called mid MiraLAX he has been eaten prunes.  At this time he says that he cannot even have passed gas today it is so bad.  I told him that Dr. Randy Buttery has called him lactulose its liquid you can take it up to 3 times a day.  He gets in a bind over the weekend he did have to go to the ED .  I sent it to the pharmacy already but Dr. Randy Buttery needs to sign. patient understands

## 2023-10-20 NOTE — Telephone Encounter (Signed)
 Patient says that he has been taking Colace and he did MiraLAX sometimes more than 1 time a day, he says today that he actually cannot even pass gas and he is very uncomfortable.  Dr. Randy Buttery said to give him some lactulose and can take it up to 3 times a day and hopefully that he will get a good bowel movement.

## 2023-10-22 ENCOUNTER — Other Ambulatory Visit: Payer: Self-pay

## 2023-10-24 NOTE — Progress Notes (Signed)
 Rapid Infusion Rituximab  Pharmacist Evaluation  Joel [name] is a 63 y.o. male being treated with rituximab  for Diffuse large B cell Lymphoma. This patient may be considered for RIR.   A pharmacist has verified the patient tolerated rituximab  infusions per the [facility] standard infusion protocol without grade 3-4 infusion reactions. The treatment plan will be updated to reflect RIR if the patient qualifies per the checklist below:   Age > 44 years old Yes   Clinically significant cardiovascular disease No   Circulating lymphocyte count < 5000/uL prior to cycle two Yes  Lab Results  Component Value Date   LYMPHSABS 1.6 10/16/2023    Prior documented grade 3-4 infusion reaction to rituximab  No   Prior documented grade 1-2 infusion reaction to rituximab  (If YES, Pharmacist will confirm with Physician if patient is still a candidate for RIR) No   Previous rituximab  infusion within the past 6 months No   Treatment Plan updated orders to reflect RIR Yes    Joel [name] does meet the criteria for Rapid Infusion Rituximab . This patient is going to be switched to rapid infusion rituximab .   Joel [name] 10/24/23 2:43 PM

## 2023-10-25 ENCOUNTER — Inpatient Hospital Stay: Payer: MEDICAID

## 2023-10-25 ENCOUNTER — Inpatient Hospital Stay (HOSPITAL_BASED_OUTPATIENT_CLINIC_OR_DEPARTMENT_OTHER): Payer: MEDICAID | Admitting: Oncology

## 2023-10-25 ENCOUNTER — Encounter: Payer: Self-pay | Admitting: Oncology

## 2023-10-25 ENCOUNTER — Encounter: Payer: Self-pay | Admitting: Hospice and Palliative Medicine

## 2023-10-25 VITALS — BP 99/77 | HR 110 | Temp 97.8°F | Resp 18 | Ht 66.0 in | Wt 207.5 lb

## 2023-10-25 VITALS — BP 115/90 | HR 104 | Resp 18

## 2023-10-25 DIAGNOSIS — Z79620 Long term (current) use of immunosuppressive biologic: Secondary | ICD-10-CM

## 2023-10-25 DIAGNOSIS — C83398 Diffuse large b-cell lymphoma of other extranodal and solid organ sites: Secondary | ICD-10-CM

## 2023-10-25 DIAGNOSIS — Z5111 Encounter for antineoplastic chemotherapy: Secondary | ICD-10-CM

## 2023-10-25 DIAGNOSIS — Z5181 Encounter for therapeutic drug level monitoring: Secondary | ICD-10-CM

## 2023-10-25 LAB — CMP (CANCER CENTER ONLY)
ALT: 24 U/L (ref 0–44)
AST: 25 U/L (ref 15–41)
Albumin: 3.8 g/dL (ref 3.5–5.0)
Alkaline Phosphatase: 125 U/L (ref 38–126)
Anion gap: 10 (ref 5–15)
BUN: 18 mg/dL (ref 8–23)
CO2: 26 mmol/L (ref 22–32)
Calcium: 9.3 mg/dL (ref 8.9–10.3)
Chloride: 101 mmol/L (ref 98–111)
Creatinine: 0.99 mg/dL (ref 0.61–1.24)
GFR, Estimated: >60 mL/min (ref >60)
Glucose, Bld: 104 mg/dL — ABNORMAL HIGH (ref 70–99)
Potassium: 3.7 mmol/L (ref 3.5–5.1)
Sodium: 137 mmol/L (ref 135–145)
Total Bilirubin: 1 mg/dL (ref 0.0–1.2)
Total Protein: 6.9 g/dL (ref 6.5–8.1)

## 2023-10-25 LAB — CBC WITH DIFFERENTIAL (CANCER CENTER ONLY)
Abs Immature Granulocytes: 0.27 10*3/uL — ABNORMAL HIGH (ref 0.00–0.07)
Basophils Absolute: 0.2 10*3/uL — ABNORMAL HIGH (ref 0.0–0.1)
Basophils Relative: 1 %
Eosinophils Absolute: 0.1 10*3/uL (ref 0.0–0.5)
Eosinophils Relative: 0 %
HCT: 40.7 % (ref 39.0–52.0)
Hemoglobin: 14.2 g/dL (ref 13.0–17.0)
Immature Granulocytes: 1 %
Lymphocytes Relative: 11 %
Lymphs Abs: 2.2 10*3/uL (ref 0.7–4.0)
MCH: 30.8 pg (ref 26.0–34.0)
MCHC: 34.9 g/dL (ref 30.0–36.0)
MCV: 88.3 fL (ref 80.0–100.0)
Monocytes Absolute: 1.6 10*3/uL — ABNORMAL HIGH (ref 0.1–1.0)
Monocytes Relative: 9 %
Neutro Abs: 14.8 10*3/uL — ABNORMAL HIGH (ref 1.7–7.7)
Neutrophils Relative %: 78 %
Platelet Count: 456 10*3/uL — ABNORMAL HIGH (ref 150–400)
RBC: 4.61 MIL/uL (ref 4.22–5.81)
RDW: 15 % (ref 11.5–15.5)
WBC Count: 19.2 10*3/uL — ABNORMAL HIGH (ref 4.0–10.5)
nRBC: 0 % (ref 0.0–0.2)

## 2023-10-25 MED ORDER — SODIUM CHLORIDE 0.9 % IV SOLN
375.0000 mg/m2 | Freq: Once | INTRAVENOUS | Status: AC
  Administered 2023-10-25: 800 mg via INTRAVENOUS
  Filled 2023-10-25: qty 50

## 2023-10-25 MED ORDER — VINCRISTINE SULFATE CHEMO INJECTION 1 MG/ML
2.0000 mg | Freq: Once | INTRAVENOUS | Status: AC
  Administered 2023-10-25: 2 mg via INTRAVENOUS
  Filled 2023-10-25: qty 2

## 2023-10-25 MED ORDER — DOXORUBICIN HCL CHEMO IV INJECTION 2 MG/ML
50.0000 mg/m2 | Freq: Once | INTRAVENOUS | Status: AC
  Administered 2023-10-25: 108 mg via INTRAVENOUS
  Filled 2023-10-25: qty 54

## 2023-10-25 MED ORDER — PALONOSETRON HCL INJECTION 0.25 MG/5ML
0.2500 mg | Freq: Once | INTRAVENOUS | Status: AC
  Administered 2023-10-25: 0.25 mg via INTRAVENOUS
  Filled 2023-10-25: qty 5

## 2023-10-25 MED ORDER — HEPARIN SOD (PORK) LOCK FLUSH 100 UNIT/ML IV SOLN
500.0000 [IU] | Freq: Once | INTRAVENOUS | Status: AC | PRN
  Administered 2023-10-25: 500 [IU]
  Filled 2023-10-25: qty 5

## 2023-10-25 MED ORDER — ALTEPLASE 2 MG IJ SOLR
2.0000 mg | Freq: Once | INTRAMUSCULAR | Status: DC | PRN
Start: 2023-10-25 — End: 2023-10-25
  Filled 2023-10-25: qty 2

## 2023-10-25 MED ORDER — SODIUM CHLORIDE 0.9 % IV SOLN
150.0000 mg | Freq: Once | INTRAVENOUS | Status: AC
  Administered 2023-10-25: 150 mg via INTRAVENOUS
  Filled 2023-10-25: qty 150

## 2023-10-25 MED ORDER — DIPHENHYDRAMINE HCL 25 MG PO CAPS
50.0000 mg | ORAL_CAPSULE | Freq: Once | ORAL | Status: AC
  Administered 2023-10-25: 50 mg via ORAL
  Filled 2023-10-25: qty 2

## 2023-10-25 MED ORDER — DEXAMETHASONE SODIUM PHOSPHATE 10 MG/ML IJ SOLN
10.0000 mg | Freq: Once | INTRAMUSCULAR | Status: AC
  Administered 2023-10-25: 10 mg via INTRAVENOUS
  Filled 2023-10-25: qty 1

## 2023-10-25 MED ORDER — SODIUM CHLORIDE 0.9 % IV SOLN
INTRAVENOUS | Status: DC
  Filled 2023-10-25: qty 250

## 2023-10-25 MED ORDER — ACETAMINOPHEN 325 MG PO TABS
650.0000 mg | ORAL_TABLET | Freq: Once | ORAL | Status: AC
  Administered 2023-10-25: 650 mg via ORAL
  Filled 2023-10-25: qty 2

## 2023-10-25 MED ORDER — SODIUM CHLORIDE 0.9 % IV SOLN
750.0000 mg/m2 | Freq: Once | INTRAVENOUS | Status: AC
  Administered 2023-10-25: 1500 mg via INTRAVENOUS
  Filled 2023-10-25: qty 75

## 2023-10-25 MED ORDER — SODIUM CHLORIDE 0.9% FLUSH
10.0000 mL | INTRAVENOUS | Status: DC | PRN
  Administered 2023-10-25: 10 mL
  Filled 2023-10-25: qty 10

## 2023-10-25 MED ORDER — SODIUM CHLORIDE 0.9% FLUSH
3.0000 mL | INTRAVENOUS | Status: DC | PRN
  Filled 2023-10-25: qty 3

## 2023-10-25 MED ORDER — HEPARIN SOD (PORK) LOCK FLUSH 100 UNIT/ML IV SOLN
250.0000 [IU] | Freq: Once | INTRAVENOUS | Status: DC | PRN
Start: 2023-10-25 — End: 2023-10-25
  Filled 2023-10-25: qty 5

## 2023-10-25 NOTE — Progress Notes (Signed)
 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  On the day of his colonoscopy patient noticed that he was jaundiced and therefore underwent labs which showed bilirubin of 14 on his CMP AST ALT of 70 and 108 and alkaline phosphatase of 246 respectively.  This was followed by MRI abdomen with MRCP with and without contrast which showed a large complex partially necrotic mass involving the central aspect of the liver.  Mass is involving segment 4A, segment 8 and the caudate lobe.  Also surrounds the gallbladder and is obstructing the biliary tree centrally near the confluence of the right and left hepatic ducts.  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  Compression and possible obstruction of the left portal vein.  The main portal vein is patent and the middle and right portal veins are patent.  There is tumor adjacent to and compressing the intrahepatic IVC but no evidence of occlusion.  Gastrohepatic ligament and celiac axis and periportal nodes concerning for metastatic disease.  Patient underwent ERCP by Dr. Danise Durie on 08/23/2023 which showed a segmental biliary stricture that was malignant appearing and a plastic stent was placed.  Cells for cytology were obtained which was consistent with atypical glandular cells but not diagnostic for malignancy.   Patient was seen by Dr. Wheeler Hammonds from pancreaticobiliary  surgery at [facility] and was not deemed to be a surgical candidate.  He was then seen by Dr. Rogue Clear from medical oncology who discussed the possibility that this could be locally advanced cholangiocarcinoma and discussed possible treatment options for the same.  However since patient did not have definitive tissue diagnosis liver biopsy was recommended.  He had a port placed in the meanwhile.  Liver biopsy surprisingly did not show any evidence of cholangiocarcinoma but was consistent with diffuse large B-cell lymphoma.   Diffuse large B cell lymphoma (DLBCL), germinal center B-cell like (GCB) subtype. See comment.   Comment: Histologic sections show needle core shaped tissue with diffuse infiltrate of large atypical lymphoid cells.  No normal liver parenchyma is identified.  The immunophenotype of the large atypical lymphoid cells are consistent with DLBCL GCB subtype per Alphonso Jean algorithm.  Staining for MYC and EBER was negative.83% of nuclei showed rearrangement of the MYC (8q24) locus by interphase FISH; 44-54% of nuclei showed additional, intact copies of the BCL6 locus (3q27) and BCL2 loci. No evidence of BCL2 rearrangement, BCL6 rearrangement, or IGH::MYC rearrangement was observed. MYC rearrangement in the absence of BCL2 or BCL6 gene rearrangements, is a nonspecific finding and may be seen in Burkitt lymphoma (BL) or diffuse large B-cell lymphoma, not otherwise specified (DLBCL, NOS).       Summary Table  Locus Result  MYC (8q24) 83% with MYC rearrangement  BCL2 (18q21) 54% with 3 intact copies No evidence of BCL2 rearrangement   BCL6 (3q27) 44% with 3 intact copies No evidence of BCL6 rearrangement       PET CT scan showed multiple hypermetabolic mass in the liver and tracer  avid abdominal lymph nodes compatible with lymphoma.  There was a thyroid  nodule 2.1 cm with an SUV of 6.6 and thyroid  ultrasound was recommended for the same.  Baseline echocardiogram normal.  Given abnormal LFTs and GCB  subtype plan is to proceed with R-CHOP chemotherapy instead of R Pola CHP chemotherapy.  IPI score 2-3 low- high intemediate risk (age greater than 60 and stage IV disease.  Extranodal sites-?2-given liver/contiguous gallbladder/ bile duct involvement)      Interval history-he feels well overall today.  Denies any symptoms of fever chills or night sweats.  He did have significant low back pain after receiving Neulasta  with cycle 1 of chemotherapy which has now resolved.  ECOG PS- 1 Pain scale- 0   Review of systems- Review of Systems  Constitutional:  Negative for chills, fever, malaise/fatigue and weight loss.  HENT:  Negative for congestion, ear discharge and nosebleeds.   Eyes:  Negative for blurred vision.  Respiratory:  Negative for cough, hemoptysis, sputum production, shortness of breath and wheezing.   Cardiovascular:  Negative for chest pain, palpitations, orthopnea and claudication.  Gastrointestinal:  Negative for abdominal pain, blood in stool, constipation, diarrhea, heartburn, melena, nausea and vomiting.  Genitourinary:  Negative for dysuria, flank pain, frequency, hematuria and urgency.  Musculoskeletal:  Negative for back pain, joint pain and myalgias.  Skin:  Negative for rash.  Neurological:  Negative for dizziness, tingling, focal weakness, seizures, weakness and headaches.  Endo/Heme/Allergies:  Does not bruise/bleed easily.  Psychiatric/Behavioral:  Negative for depression and suicidal ideas. The patient does not have insomnia.       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    GERD (gastroesophageal reflux disease)    Headache    Hyperlipidemia    Hypertension    Statin  myopathy    Stroke [facility])    Thyroid  nodule      Past Surgical History:  Procedure Laterality Date   COLONOSCOPY N/A 08/18/2023   Procedure: COLONOSCOPY;  Surgeon: Shane Darling, MD;  Location: [facility] ENDOSCOPY;  Service: Endoscopy;  Laterality: N/A;   ERCP N/A 08/23/2023   Procedure: ERCP, WITH INTERVENTION IF INDICATED;  Surgeon: Marnee Sink, MD;  Location: [facility] ENDOSCOPY;  Service: Endoscopy;  Laterality: N/A;   ERCP N/A 10/03/2023   Procedure: ERCP, WITH INTERVENTION IF INDICATED;  Surgeon: Marnee Sink, MD;  Location: [facility] ENDOSCOPY;  Service: Endoscopy;  Laterality: N/A;   MOHS SURGERY     2025, left cheek   POLYPECTOMY  08/18/2023   Procedure: POLYPECTOMY, INTESTINE;  Surgeon: Shane Darling, MD;  Location: [facility] ENDOSCOPY;  Service: Endoscopy;;   TONSILLECTOMY     1968    Social History   Socioeconomic History   Marital status: Divorced    Spouse name: Not on file   Number of children: Not on file   Years of education: Not on file   Highest education level: Not on file  Occupational History   Not on file  Tobacco Use   Smoking status: Former   Smokeless tobacco: Never  Vaping Use   Vaping status: Never Used  Substance and Sexual Activity   Alcohol use: Yes    Comment: occasional   Drug  use: No   Sexual activity: Not on file  Other Topics Concern   Not on file  Social History Narrative   Not on file   Social Drivers of Health   Financial Resource Strain: Low Risk  (07/05/2023)   Received from [facility]   Overall Financial Resource Strain (CARDIA)    Difficulty of Paying Living Expenses: Not hard at all  Food Insecurity: No Food Insecurity (09/20/2023)   Hunger Vital Sign    Worried About Running Out of Food in the Last Year: Never true    Ran Out of Food in the Last Year: Never true  Transportation Needs: No Transportation Needs (07/05/2023)   Received from [facility] - Transportation    In the past  12 months, has lack of transportation kept you from medical appointments or from getting medications?: No    Lack of Transportation (Non-Medical): No  Physical Activity: Not on file  Stress: Not on file  Social Connections: Not on file  Intimate Partner Violence: Not At Risk (09/20/2023)   Humiliation, Afraid, Rape, and Kick questionnaire    Fear of Current or Ex-Partner: No    Emotionally Abused: No    Physically Abused: No    Sexually Abused: No    Family History  Problem Relation Age of Onset   Cancer Sister    Cancer Brother      Current Outpatient Medications:    acyclovir  (ZOVIRAX ) 400 MG tablet, Take 1 tablet (400 mg total) by mouth daily., Disp: 30 tablet, Rfl: 3   allopurinol  (ZYLOPRIM ) 300 MG tablet, Take 1 tablet (300 mg total) by mouth daily., Disp: 30 tablet, Rfl: 3   ALPRAZolam (XANAX) 1 MG tablet, Take 0.5 mg by mouth daily as needed for anxiety., Disp: , Rfl:    amphetamine-dextroamphetamine (ADDERALL XR) 20 MG 24 hr capsule, Take 20 mg by mouth daily., Disp: , Rfl:    aspirin 81 MG chewable tablet, Chew 81 mg by mouth daily., Disp: , Rfl:    atorvastatin (LIPITOR) 40 MG tablet, Take 40 mg by mouth daily. (Patient not taking: Reported on 08/23/2023), Disp: , Rfl:    buPROPion (WELLBUTRIN XL) 300 MG 24 hr tablet, Take 300 mg by mouth daily., Disp: , Rfl:    ezetimibe (ZETIA) 10 MG tablet, Take 1 tablet by mouth daily., Disp: , Rfl:    gabapentin  (NEURONTIN ) 100 MG capsule, Take 1 capsule (100 mg total) by mouth 3 (three) times daily. (Patient not taking: Reported on 09/20/2023), Disp: 30 capsule, Rfl: 0   lactulose (CHRONULAC) 10 GM/15ML solution, Take 15 mLs (10 g total) by mouth as needed for moderate constipation., Disp: 236 mL, Rfl: 0   levofloxacin  (LEVAQUIN ) 750 MG tablet, Take 1 tablet (750 mg total) by mouth daily., Disp: 14 tablet, Rfl: 0   lidocaine  (LIDODERM ) 5 %, Place 1 patch onto the skin daily. Remove & Discard patch within 12 hours or as directed by MD,  Disp: 30 patch, Rfl: 0   lidocaine -prilocaine  (EMLA ) cream, Apply to affected area once, Disp: 30 g, Rfl: 3   naproxen  (NAPROSYN ) 500 MG tablet, Take 1 tablet (500 mg total) by mouth 2 (two) times daily with a meal., Disp: 20 tablet, Rfl: 0   omeprazole (PRILOSEC) 40 MG capsule, Take 40 mg by mouth daily., Disp: , Rfl:    ondansetron  (ZOFRAN  ODT) 4 MG disintegrating tablet, Take 1 tablet (4 mg total) by mouth every 8 (eight) hours as needed for nausea  or vomiting., Disp: 20 tablet, Rfl: 0   ondansetron  (ZOFRAN ) 8 MG tablet, Take 1 tablet (8 mg total) by mouth every 8 (eight) hours as needed for nausea or vomiting. Start on the third day after cyclophosphamide  chemotherapy., Disp: 30 tablet, Rfl: 1   prochlorperazine  (COMPAZINE ) 10 MG tablet, Take 1 tablet (10 mg total) by mouth every 6 (six) hours as needed for nausea or vomiting., Disp: 30 tablet, Rfl: 6   telmisartan -hydrochlorothiazide  (MICARDIS  HCT) 80-25 MG tablet, Take 1 tablet by mouth daily., Disp: 90 tablet, Rfl: 0   traZODone (DESYREL) 50 MG tablet, Take 50 mg by mouth at bedtime., Disp: , Rfl:   Physical exam:  Vitals:   10/25/23 0848  BP: 99/77  Pulse: (!) 110  Resp: 18  Temp: 97.8 F (36.6 C)  TempSrc: Tympanic  SpO2: 96%  Weight: 207 lb 8 oz (94.1 kg)  Height: 5' 6 (1.676 m)   Physical Exam  Cardiovascular:     Rate and Rhythm: Regular rhythm. Tachycardia present.     Heart sounds: Normal heart sounds.  Pulmonary:     Effort: Pulmonary effort is normal.     Breath sounds: Normal breath sounds.  Abdominal:     General: Bowel sounds are normal.     Palpations: Abdomen is soft.   Skin:    General: Skin is warm and dry.   Neurological:     Mental Status: He is alert and oriented to person, place, and time.      I have personally reviewed labs listed below:    Latest Ref Rng & Units 10/25/2023    8:31 AM  CMP  Glucose 70 - 99 mg/dL 161   BUN 8 - 23 mg/dL 18   Creatinine 0.96 - 1.24 mg/dL 0.45   Sodium 409 -  811 mmol/L 137   Potassium 3.5 - 5.1 mmol/L 3.7   Chloride 98 - 111 mmol/L 101   CO2 22 - 32 mmol/L 26   Calcium 8.9 - 10.3 mg/dL 9.3   Total Protein 6.5 - 8.1 g/dL 6.9   Total Bilirubin 0.0 - 1.2 mg/dL 1.0   Alkaline Phos 38 - 126 U/L 125   AST 15 - 41 U/L 25   ALT 0 - 44 U/L 24       Latest Ref Rng & Units 10/25/2023    8:31 AM  CBC  WBC 4.0 - 10.5 K/uL 19.2   Hemoglobin 13.0 - 17.0 g/dL 91.4   Hematocrit 78.2 - 52.0 % 40.7   Platelets 150 - 400 K/uL 456    I have personally reviewed Radiology images listed below: No images are attached to the encounter.  DG Chest 1 View Result Date: 10/12/2023 CLINICAL DATA:  [place]ills EXAM: CHEST  1 VIEW COMPARISON:  Chest x-ray 07/20/2023 FINDINGS: Right-sided chest port catheter tip ends in the SVC. The heart size and mediastinal contours are within normal limits. Both lungs are clear. The visualized skeletal structures are unremarkable. IMPRESSION: No active disease. Electronically Signed   By: Tyron Gallon M.D.   On: 10/12/2023 18:27   DG C-Arm 1-60 Min-No Report Result Date: 10/03/2023 Fluoroscopy was utilized by the requesting physician.  No radiographic interpretation.   NM Cardiac Muga Rest Result Date: 09/27/2023 CLINICAL DATA:  [place]'s tumor EXAM: NUCLEAR MEDICINE CARDIAC BLOOD POOL IMAGING (MUGA) TECHNIQUE: Cardiac multi-gated acquisition was performed at rest following intravenous injection of Tc-59m labeled red blood cells. RADIOPHARMACEUTICALS:  21.5 mCi Tc-72m pertechnetate in-vitro labeled red blood cells IV COMPARISON:  PET-CT, 09/25/2023 FINDINGS: The LEFT ventricular wall is normal in size and demonstrate normal wall motion. Calculated LEFT ventricular ejection fraction; 55% IMPRESSION: 1. Normal LEFT ventricular size and wall motion. 2. Calculated LVEF 55%. Electronically Signed   By: Art Largo M.D.   On: 09/27/2023 17:58   NM PET Image Initial (PI) Skull Base To Thigh Result Date: 09/25/2023 CLINICAL DATA:  Initial  treatment strategy for diffuse large B-cell lymphoma. EXAM: NUCLEAR MEDICINE PET SKULL BASE TO THIGH TECHNIQUE: 11.93 mCi F-18 FDG was injected intravenously. Full-ring PET imaging was performed from the skull base to thigh after the radiotracer. CT data was obtained and used for attenuation correction and anatomic localization. Fasting blood glucose: 92 mg/dl COMPARISON:  MRI 40/98/1191 FINDINGS: Mediastinal blood pool activity: SUV max 2.8 Liver activity: SUV max 4.0 NECK: No hypermetabolic lymph nodes in the neck. Incidental CT findings: Tracer avid nodule within the inferior pole of right lobe of thyroid  gland measures 2.1 cm and has an SUV max 6.6, image 37. CHEST: No tracer avid axillary or supraclavicular lymph nodes. No tracer avid mediastinal or hilar lymph nodes. Within the far right lateral cardiophrenic angle fat there is a tracer avid lymph node which measures 0.6 cm with SUV max of 9.2, image 72. No tracer avid pulmonary nodules identified. Incidental CT findings: Aortic atherosclerosis and multi vessel coronary artery calcification. ABDOMEN/PELVIS: Tracer avid mass involving segments 8, 5, 4, and 0 is identified. The dominant component involving segments 4 and 8 measures 8.7 x 7.5 cm and has an SUV max of 13.6, image 74. As noted on the recent MRI the tumor extends around the gallbladder and obstructs the confluence of the bile ducts. No abnormal tracer uptake within the pancreas. No abnormal uptake within the spleen. The spleen is normal in size measuring 12.4 cm in length. Normal appearance of the adrenal glands. Tracer avid abdominal lymph nodes identified. Index lymph node adjacent to the caudate lobe measures 0.6 cm and has an SUV max of 5.6, image 80. Retrocaval lymph node at the level of the right renal vein measures 1.3 cm with SUV max of 10.4, image 93. No tracer avid pelvic or inguinal lymph nodes. Incidental CT findings: Mild aortic atherosclerosis. Interval placement of common bile duct  stent. SKELETON: No focal hypermetabolic activity to suggest skeletal metastasis. Incidental CT findings: None. IMPRESSION: 1. Tracer avid mass involving segments 8, 5, 4, and 0 of the liver is identified compatible with biopsy-proven lymphoma. The dominant component involving segments 4 and 8 measures 8.7 x 7.5 cm and has an SUV max of 13.6. 2. Tracer avid abdominal lymph nodes compatible with lymphoma. 3. Tracer avid nodule within the inferior pole of right lobe of thyroid  gland measures 2.1 cm and has an SUV max 6.6. Recommend thyroid  US  and biopsy (ref: J Am Coll Radiol. 2015 Feb;12(2): 143-50). 4.  Aortic Atherosclerosis (ICD10-I70.0). Electronically Signed   By: Kimberley Penman M.D.   On: 09/25/2023 12:53     Assessment and plan- Patient is a 63 y.o. male with history of stage IV diffuse large B-cell lymphoma GCB subtype not double hit.  He is here for on treatment assessment prior to cycle 2 of R-CHOP chemotherapy  LFTs have normalized after replacing his stent as well as cycle 1 of chemotherapy.  He will proceed with full dose of R-CHOP chemotherapy today.  He will start 5 days of 100 mg prednisone  tomorrow.  Neulasta  on day 3.  I will see him back in 3 weeks for  cycle 3 and following that we will plan to repeat his PET scan.  He is due for stent exchange procedure on 12/04/2023 which would be okay from his counts standpoint given that his cycle 4 of chemotherapy will be on 12/06/2023  Sinus tachycardia:Partly driven by anxiety.  No signs and symptoms of infection no shortness of breath.  Continue to monitor   Visit Diagnosis 1. Encounter for antineoplastic chemotherapy   2. Encounter for monitoring rituximab  therapy   3. Diffuse large B-cell lymphoma of solid organ excluding spleen      Dr. Seretha Dance, MD, MPH [facility] [phone] 10/25/2023 11:21 AM '

## 2023-10-25 NOTE — Progress Notes (Signed)
 Patient has own stimufend  dispensed through [facility] script.  Please use medication located in non chemo frig and not pyxis stock

## 2023-10-25 NOTE — Progress Notes (Signed)
 Patient states he's doing pretty good and has no new or acute concerns at this time.

## 2023-10-26 ENCOUNTER — Encounter: Payer: Self-pay | Admitting: Oncology

## 2023-10-26 ENCOUNTER — Other Ambulatory Visit: Payer: Self-pay

## 2023-10-26 ENCOUNTER — Encounter: Payer: Self-pay | Admitting: Hospice and Palliative Medicine

## 2023-10-27 ENCOUNTER — Other Ambulatory Visit: Payer: Self-pay | Admitting: Oncology

## 2023-10-27 ENCOUNTER — Inpatient Hospital Stay: Payer: MEDICAID

## 2023-10-27 DIAGNOSIS — C83398 Diffuse large b-cell lymphoma of other extranodal and solid organ sites: Secondary | ICD-10-CM

## 2023-10-27 MED ORDER — PEGFILGRASTIM-FPGK 6 MG/0.6ML ~~LOC~~ SOSY
6.0000 mg | PREFILLED_SYRINGE | Freq: Once | SUBCUTANEOUS | Status: AC
  Administered 2023-10-27: 6 mg via SUBCUTANEOUS
  Filled 2023-10-27: qty 0.6

## 2023-10-30 ENCOUNTER — Encounter: Payer: Self-pay | Admitting: Hospice and Palliative Medicine

## 2023-10-30 ENCOUNTER — Ambulatory Visit: Payer: Self-pay

## 2023-10-30 ENCOUNTER — Encounter: Payer: Self-pay | Admitting: Oncology

## 2023-10-31 ENCOUNTER — Other Ambulatory Visit: Payer: Self-pay

## 2023-11-02 ENCOUNTER — Encounter: Payer: Self-pay | Admitting: Oncology

## 2023-11-09 ENCOUNTER — Encounter: Payer: Self-pay | Admitting: Oncology

## 2023-11-14 ENCOUNTER — Ambulatory Visit
Admission: RE | Admit: 2023-11-14 | Discharge: 2023-11-14 | Disposition: A | Discharge status: Home / Self Care | Payer: MEDICAID | Source: Ambulatory Visit | Attending: Oncology | Admitting: Oncology

## 2023-11-14 DIAGNOSIS — E041 Nontoxic single thyroid nodule: Secondary | ICD-10-CM | POA: Insufficient documentation

## 2023-11-14 DIAGNOSIS — I700 Atherosclerosis of aorta: Secondary | ICD-10-CM | POA: Insufficient documentation

## 2023-11-14 DIAGNOSIS — C83398 Diffuse large b-cell lymphoma of other extranodal and solid organ sites: Secondary | ICD-10-CM | POA: Insufficient documentation

## 2023-11-14 LAB — GLUCOSE, CAPILLARY: Glucose-Capillary: 107 mg/dL — ABNORMAL HIGH (ref 70–99)

## 2023-11-14 MED ORDER — FLUDEOXYGLUCOSE F - 18 (FDG) INJECTION
11.6100 | Freq: Once | INTRAVENOUS | Status: AC | PRN
  Administered 2023-11-14: 11.61 via INTRAVENOUS

## 2023-11-14 MED FILL — Fosaprepitant Dimeglumine For IV Infusion 150 MG (Base Eq): INTRAVENOUS | Qty: 5 | Status: AC

## 2023-11-15 ENCOUNTER — Inpatient Hospital Stay (HOSPITAL_BASED_OUTPATIENT_CLINIC_OR_DEPARTMENT_OTHER): Payer: Self-pay | Admitting: Oncology

## 2023-11-15 ENCOUNTER — Encounter: Payer: Self-pay | Admitting: Oncology

## 2023-11-15 ENCOUNTER — Inpatient Hospital Stay: Payer: MEDICAID | Attending: Oncology

## 2023-11-15 ENCOUNTER — Inpatient Hospital Stay: Payer: MEDICAID

## 2023-11-15 VITALS — BP 123/84 | HR 115 | Temp 97.3°F | Resp 20 | Wt 211.4 lb

## 2023-11-15 DIAGNOSIS — C83398 Diffuse large b-cell lymphoma of other extranodal and solid organ sites: Secondary | ICD-10-CM

## 2023-11-15 DIAGNOSIS — Z5112 Encounter for antineoplastic immunotherapy: Secondary | ICD-10-CM | POA: Insufficient documentation

## 2023-11-15 DIAGNOSIS — Z5181 Encounter for therapeutic drug level monitoring: Secondary | ICD-10-CM

## 2023-11-15 DIAGNOSIS — Z5189 Encounter for other specified aftercare: Secondary | ICD-10-CM | POA: Insufficient documentation

## 2023-11-15 DIAGNOSIS — Z79620 Long term (current) use of immunosuppressive biologic: Secondary | ICD-10-CM

## 2023-11-15 DIAGNOSIS — Z5111 Encounter for antineoplastic chemotherapy: Secondary | ICD-10-CM

## 2023-11-15 LAB — CBC WITH DIFFERENTIAL (CANCER CENTER ONLY)
Abs Immature Granulocytes: 0.39 K/uL — ABNORMAL HIGH (ref 0.00–0.07)
Basophils Absolute: 0.1 K/uL (ref 0.0–0.1)
Basophils Relative: 0 %
Eosinophils Absolute: 0 K/uL (ref 0.0–0.5)
Eosinophils Relative: 0 %
HCT: 39.1 % (ref 39.0–52.0)
Hemoglobin: 13.6 g/dL (ref 13.0–17.0)
Immature Granulocytes: 1 %
Lymphocytes Relative: 3 %
Lymphs Abs: 0.9 K/uL (ref 0.7–4.0)
MCH: 31.3 pg (ref 26.0–34.0)
MCHC: 34.8 g/dL (ref 30.0–36.0)
MCV: 90.1 fL (ref 80.0–100.0)
Monocytes Absolute: 0.7 K/uL (ref 0.1–1.0)
Monocytes Relative: 3 %
Neutro Abs: 27.8 K/uL — ABNORMAL HIGH (ref 1.7–7.7)
Neutrophils Relative %: 93 %
Platelet Count: 337 K/uL (ref 150–400)
RBC: 4.34 MIL/uL (ref 4.22–5.81)
RDW: 16.1 % — ABNORMAL HIGH (ref 11.5–15.5)
WBC Count: 29.9 K/uL — ABNORMAL HIGH (ref 4.0–10.5)
nRBC: 0.1 % (ref 0.0–0.2)

## 2023-11-15 LAB — CMP (CANCER CENTER ONLY)
ALT: 23 U/L (ref 0–44)
AST: 23 U/L (ref 15–41)
Albumin: 3.8 g/dL (ref 3.5–5.0)
Alkaline Phosphatase: 92 U/L (ref 38–126)
Anion gap: 10 (ref 5–15)
BUN: 18 mg/dL (ref 8–23)
CO2: 22 mmol/L (ref 22–32)
Calcium: 9.5 mg/dL (ref 8.9–10.3)
Chloride: 102 mmol/L (ref 98–111)
Creatinine: 1.1 mg/dL (ref 0.61–1.24)
GFR, Estimated: >60 mL/min (ref >60)
Glucose, Bld: 217 mg/dL — ABNORMAL HIGH (ref 70–99)
Potassium: 3.6 mmol/L (ref 3.5–5.1)
Sodium: 134 mmol/L — ABNORMAL LOW (ref 135–145)
Total Bilirubin: 0.7 mg/dL (ref 0.0–1.2)
Total Protein: 6.8 g/dL (ref 6.5–8.1)

## 2023-11-15 MED ORDER — SODIUM CHLORIDE 0.9 % IV SOLN
375.0000 mg/m2 | Freq: Once | INTRAVENOUS | Status: AC
  Administered 2023-11-15: 800 mg via INTRAVENOUS
  Filled 2023-11-15: qty 50

## 2023-11-15 MED ORDER — VINCRISTINE SULFATE CHEMO INJECTION 1 MG/ML
2.0000 mg | Freq: Once | INTRAVENOUS | Status: AC
  Administered 2023-11-15: 2 mg via INTRAVENOUS
  Filled 2023-11-15: qty 2

## 2023-11-15 MED ORDER — DOXORUBICIN HCL CHEMO IV INJECTION 2 MG/ML
50.0000 mg/m2 | Freq: Once | INTRAVENOUS | Status: AC
  Administered 2023-11-15: 108 mg via INTRAVENOUS
  Filled 2023-11-15: qty 54

## 2023-11-15 MED ORDER — DEXAMETHASONE SODIUM PHOSPHATE 10 MG/ML IJ SOLN
10.0000 mg | Freq: Once | INTRAMUSCULAR | Status: AC
  Administered 2023-11-15: 10 mg via INTRAVENOUS

## 2023-11-15 MED ORDER — SODIUM CHLORIDE 0.9 % IV SOLN
INTRAVENOUS | Status: DC
  Filled 2023-11-15 (×2): qty 250

## 2023-11-15 MED ORDER — SODIUM CHLORIDE 0.9 % IV SOLN
150.0000 mg | Freq: Once | INTRAVENOUS | Status: AC
  Administered 2023-11-15: 150 mg via INTRAVENOUS
  Filled 2023-11-15: qty 150

## 2023-11-15 MED ORDER — ACETAMINOPHEN 325 MG PO TABS
650.0000 mg | ORAL_TABLET | Freq: Once | ORAL | Status: AC
  Administered 2023-11-15: 650 mg via ORAL

## 2023-11-15 MED ORDER — PALONOSETRON HCL INJECTION 0.25 MG/5ML
0.2500 mg | Freq: Once | INTRAVENOUS | Status: AC
  Administered 2023-11-15: 0.25 mg via INTRAVENOUS

## 2023-11-15 MED ORDER — DIPHENHYDRAMINE HCL 25 MG PO CAPS
50.0000 mg | ORAL_CAPSULE | Freq: Once | ORAL | Status: AC
  Administered 2023-11-15: 50 mg via ORAL

## 2023-11-15 MED ORDER — SODIUM CHLORIDE 0.9 % IV SOLN
750.0000 mg/m2 | Freq: Once | INTRAVENOUS | Status: AC
  Administered 2023-11-15: 1500 mg via INTRAVENOUS
  Filled 2023-11-15: qty 75

## 2023-11-15 MED ORDER — HEPARIN SOD (PORK) LOCK FLUSH 100 UNIT/ML IV SOLN
500.0000 [IU] | Freq: Once | INTRAVENOUS | Status: AC | PRN
  Administered 2023-11-15: 500 [IU]
  Filled 2023-11-15: qty 5

## 2023-11-15 NOTE — Patient Instructions (Signed)
 [facility] - A DEPT OF [place]  Discharge Instructions: Thank you for choosing [facility] to provide your oncology and hematology care.  If you have a lab appointment with the Cancer Center, please go directly to the Cancer Center and check in at the registration area.  Wear comfortable clothing and clothing appropriate for easy access to any Portacath or PICC line.   We strive to give you quality time with your provider. You may need to reschedule your appointment if you arrive late (15 or more minutes).  Arriving late affects you and other patients whose appointments are after yours.  Also, if you miss three or more appointments without notifying the office, you may be dismissed from the clinic at the provider's discretion.      For prescription refill requests, have your pharmacy contact our office and allow 72 hours for refills to be completed.    Today you received the following chemotherapy and/or immunotherapy agents VINCRISTINE , ADRIAMYCIN , CYTOXAN , RITUXAN       To help prevent nausea and vomiting after your treatment, we encourage you to take your nausea medication as directed.  BELOW ARE SYMPTOMS THAT SHOULD BE REPORTED IMMEDIATELY: *FEVER GREATER THAN 100.4 F (38 C) OR HIGHER *CHILLS OR SWEATING *NAUSEA AND VOMITING THAT IS NOT CONTROLLED WITH YOUR NAUSEA MEDICATION *UNUSUAL SHORTNESS OF BREATH *UNUSUAL BRUISING OR BLEEDING *URINARY PROBLEMS (pain or burning when urinating, or frequent urination) *BOWEL PROBLEMS (unusual diarrhea, constipation, pain near the anus) TENDERNESS IN MOUTH AND THROAT WITH OR WITHOUT PRESENCE OF ULCERS (sore throat, sores in mouth, or a toothache) UNUSUAL RASH, SWELLING OR PAIN  UNUSUAL VAGINAL DISCHARGE OR ITCHING   Items with * indicate a potential emergency and should be followed up as soon as possible or go to the Emergency Department if any problems should occur.  Please show the  CHEMOTHERAPY ALERT CARD or IMMUNOTHERAPY ALERT CARD at check-in to the Emergency Department and triage nurse.  Should you have questions after your visit or need to cancel or reschedule your appointment, please contact [facility] - A DEPT OF [facility]  [phone] and follow the prompts.  Office hours are 8:00 a.m. to 4:30 p.m. Monday - Friday. Please note that voicemails left after 4:00 p.m. may not be returned until the following business day.  We are closed weekends and major holidays. You have access to a nurse at all times for urgent questions. Please call the main number to the clinic [phone] and follow the prompts.  For any non-urgent questions, you may also contact your provider using MyChart. We now offer e-Visits for anyone 66 and older to request care online for non-urgent symptoms. For details visit [email or web address].   Also download the MyChart app! Go to the app store, search MyChart, open the app, select [place], and log in with your MyChart username and password.   Doxorubicin  Injection What is this medication? DOXORUBICIN  (dox oh ROO bi sin) treats some types of cancer. It works by slowing down the growth of cancer cells. This medicine may be used for other purposes; ask your health care provider or pharmacist if you have questions. COMMON BRAND NAME(S): Adriamycin , Adriamycin  PFS, Adriamycin  RDF, Rubex  What should I tell my care team before I take this medication? They need to know if you have any of these conditions: Heart disease History of low blood cell levels caused by a medication Liver disease Recent or  ongoing radiation An unusual or allergic reaction to doxorubicin , other medications, foods, dyes, or preservatives If you or your partner are pregnant or trying to get pregnant Breast-feeding How should I use this medication? This medication is injected into a vein. It is given by your care team in a hospital or  clinic setting. Talk to your care team about the use of this medication in children. Special care may be needed. Overdosage: If you think you have taken too much of this medicine contact a poison control center or emergency room at once. NOTE: This medicine is only for you. Do not share this medicine with others. What if I miss a dose? Keep appointments for follow-up doses. It is important not to miss your dose. Call your care team if you are unable to keep an appointment. What may interact with this medication? 6-mercaptopurine Paclitaxel Phenytoin St. John's wort Trastuzumab Verapamil This list may not describe all possible interactions. Give your health care provider a list of all the medicines, herbs, non-prescription drugs, or dietary supplements you use. Also tell them if you smoke, drink alcohol, or use illegal drugs. Some items may interact with your medicine. What should I watch for while using this medication? Your condition will be monitored carefully while you are receiving this medication. You may need blood work while taking this medication. This medication may make you feel generally unwell. This is not uncommon as chemotherapy can affect healthy cells as well as cancer cells. Report any side effects. Continue your course of treatment even though you feel ill unless your care team tells you to stop. There is a maximum amount of this medication you should receive throughout your life. The amount depends on the medical condition being treated and your overall health. Your care team will watch how much of this medication you receive. Tell your care team if you have taken this medication before. Your urine may turn red for a few days after your dose. This is not blood. If your urine is dark or brown, call your care team. In some cases, you may be given additional medications to help with side effects. Follow all directions for their use. This medication may increase your risk of  getting an infection. Call your care team for advice if you get a fever, chills, sore throat, or other symptoms of a cold or flu. Do not treat yourself. Try to avoid being around people who are sick. This medication may increase your risk to bruise or bleed. Call your care team if you notice any unusual bleeding. Talk to your care team about your risk of cancer. You may be more at risk for certain types of cancers if you take this medication. Talk to your care team if you or your partner may be pregnant. Serious birth defects can occur if you take this medication during pregnancy and for 6 months after the last dose. Contraception is recommended while taking this medication and for 6 months after the last dose. Your care team can help you find the option that works for you. If your partner can get pregnant, use a condom while taking this medication and for 6 months after the last dose. Do not breastfeed while taking this medication. This medication may cause infertility. Talk to your care team if you are concerned about your fertility. What side effects may I notice from receiving this medication? Side effects that you should report to your care team as soon as possible: Allergic reactions--skin rash, itching, hives, swelling  of the face, lips, tongue, or throat Heart failure--shortness of breath, swelling of the ankles, feet, or hands, sudden weight gain, unusual weakness or fatigue Heart rhythm changes--fast or irregular heartbeat, dizziness, feeling faint or lightheaded, chest pain, trouble breathing Infection--fever, chills, cough, sore throat, wounds that don't heal, pain or trouble when passing urine, general feeling of discomfort or being unwell Low red blood cell level--unusual weakness or fatigue, dizziness, headache, trouble breathing Painful swelling, warmth, or redness of the skin, blisters or sores at the infusion site Unusual bruising or bleeding Side effects that usually do not require  medical attention (report to your care team if they continue or are bothersome): Diarrhea Hair loss Nausea Pain, redness, or swelling with sores inside the mouth or throat Red urine This list may not describe all possible side effects. Call your doctor for medical advice about side effects. You may report side effects to FDA at 1-800-FDA-1088. Where should I keep my medication? This medication is given in a hospital or clinic. It will not be stored at home. NOTE: This sheet is a summary. It may not cover all possible information. If you have questions about this medicine, talk to your doctor, pharmacist, or health care provider.  2024 Elsevier/Gold Standard (2022-07-28 00:00:00)  Cyclophosphamide  Injection What is this medication? CYCLOPHOSPHAMIDE  (sye kloe FOSS fa mide) treats some types of cancer. It works by slowing down the growth of cancer cells. This medicine may be used for other purposes; ask your health care provider or pharmacist if you have questions. COMMON BRAND NAME(S): Cyclophosphamide , Cytoxan , Neosar  What should I tell my care team before I take this medication? They need to know if you have any of these conditions: Heart disease Irregular heartbeat or rhythm Infection Kidney problems Liver disease Low blood cell levels (white cells, platelets, or red blood cells) Lung disease Previous radiation Trouble passing urine An unusual or allergic reaction to cyclophosphamide , other medications, foods, dyes, or preservatives Pregnant or trying to get pregnant Breast-feeding How should I use this medication? This medication is injected into a vein. It is given by your care team in a hospital or clinic setting. Talk to your care team about the use of this medication in children. Special care may be needed. Overdosage: If you think you have taken too much of this medicine contact a poison control center or emergency room at once. NOTE: This medicine is only for you. Do not  share this medicine with others. What if I miss a dose? Keep appointments for follow-up doses. It is important not to miss your dose. Call your care team if you are unable to keep an appointment. What may interact with this medication? Amphotericin B Amiodarone Azathioprine Certain antivirals for HIV or hepatitis Certain medications for blood pressure, such as enalapril, lisinopril, quinapril Cyclosporine Diuretics Etanercept Indomethacin Medications that relax muscles Metronidazole Natalizumab Tamoxifen Warfarin This list may not describe all possible interactions. Give your health care provider a list of all the medicines, herbs, non-prescription drugs, or dietary supplements you use. Also tell them if you smoke, drink alcohol, or use illegal drugs. Some items may interact with your medicine. What should I watch for while using this medication? This medication may make you feel generally unwell. This is not uncommon as chemotherapy can affect healthy cells as well as cancer cells. Report any side effects. Continue your course of treatment even though you feel ill unless your care team tells you to stop. You may need blood work while you are taking this  medication. This medication may increase your risk of getting an infection. Call your care team for advice if you get a fever, chills, sore throat, or other symptoms of a cold or flu. Do not treat yourself. Try to avoid being around people who are sick. Avoid taking medications that contain aspirin, acetaminophen , ibuprofen, naproxen , or ketoprofen unless instructed by your care team. These medications may hide a fever. Be careful brushing or flossing your teeth or using a toothpick because you may get an infection or bleed more easily. If you have any dental work done, tell your dentist you are receiving this medication. Drink water or other fluids as directed. Urinate often, even at night. Some products may contain alcohol. Ask your care  team if this medication contains alcohol. Be sure to tell all care teams you are taking this medicine. Certain medicines, like metronidazole and disulfiram, can cause an unpleasant reaction when taken with alcohol. The reaction includes flushing, headache, nausea, vomiting, sweating, and increased thirst. The reaction can last from 30 minutes to several hours. Talk to your care team if you wish to become pregnant or think you might be pregnant. This medication can cause serious birth defects if taken during pregnancy and for 1 year after the last dose. A negative pregnancy test is required before starting this medication. A reliable form of contraception is recommended while taking this medication and for 1 year after the last dose. Talk to your care team about reliable forms of contraception. Do not father a child while taking this medication and for 4 months after the last dose. Use a condom during this time period. Do not breast-feed while taking this medication or for 1 week after the last dose. This medication may cause infertility. Talk to your care team if you are concerned about your fertility. Talk to your care team about your risk of cancer. You may be more at risk for certain types of cancer if you take this medication. What side effects may I notice from receiving this medication? Side effects that you should report to your care team as soon as possible: Allergic reactions--skin rash, itching, hives, swelling of the face, lips, tongue, or throat Dry cough, shortness of breath or trouble breathing Heart failure--shortness of breath, swelling of the ankles, feet, or hands, sudden weight gain, unusual weakness or fatigue Heart muscle inflammation--unusual weakness or fatigue, shortness of breath, chest pain, fast or irregular heartbeat, dizziness, swelling of the ankles, feet, or hands Heart rhythm changes--fast or irregular heartbeat, dizziness, feeling faint or lightheaded, chest pain, trouble  breathing Infection--fever, chills, cough, sore throat, wounds that don't heal, pain or trouble when passing urine, general feeling of discomfort or being unwell Kidney injury--decrease in the amount of urine, swelling of the ankles, hands, or feet Liver injury--right upper belly pain, loss of appetite, nausea, light-colored stool, dark yellow or brown urine, yellowing skin or eyes, unusual weakness or fatigue Low red blood cell level--unusual weakness or fatigue, dizziness, headache, trouble breathing Low sodium level--muscle weakness, fatigue, dizziness, headache, confusion Red or dark brown urine Unusual bruising or bleeding Side effects that usually do not require medical attention (report to your care team if they continue or are bothersome): Hair loss Irregular menstrual cycles or spotting Loss of appetite Nausea Pain, redness, or swelling with sores inside the mouth or throat Vomiting This list may not describe all possible side effects. Call your doctor for medical advice about side effects. You may report side effects to FDA at 1-800-FDA-1088. Where should  I keep my medication? This medication is given in a hospital or clinic. It will not be stored at home. NOTE: This sheet is a summary. It may not cover all possible information. If you have questions about this medicine, talk to your doctor, pharmacist, or health care provider.  2024 Elsevier/Gold Standard (2021-09-10 00:00:00)  Rituximab  Injection What is this medication? RITUXIMAB  (ri TUX i mab) treats leukemia and lymphoma. It works by blocking a protein that causes cancer cells to grow and multiply. This helps to slow or stop the spread of cancer cells. It may also be used to treat autoimmune conditions, such as arthritis. It works by slowing down an overactive immune system. It is a monoclonal antibody. This medicine may be used for other purposes; ask your health care provider or pharmacist if you have questions. COMMON  BRAND NAME(S): RIABNI , Rituxan , RUXIENCE , truxima  What should I tell my care team before I take this medication? They need to know if you have any of these conditions: Chest pain Heart disease Immune system problems Infection, such as chickenpox, cold sores, hepatitis B, herpes Irregular heartbeat or rhythm Kidney disease Low blood counts, such as low white cells, platelets, red cells Lung disease Recent or upcoming vaccine An unusual or allergic reaction to rituximab , other medications, foods, dyes, or preservatives Pregnant or trying to get pregnant Breast-feeding How should I use this medication? This medication is injected into a vein. It is given by a care team in a hospital or clinic setting. A special MedGuide will be given to you before each treatment. Be sure to read this information carefully each time. Talk to your care team about the use of this medication in children. While this medication may be prescribed for children as young as 6 months for selected conditions, precautions do apply. Overdosage: If you think you have taken too much of this medicine contact a poison control center or emergency room at once. NOTE: This medicine is only for you. Do not share this medicine with others. What if I miss a dose? Keep appointments for follow-up doses. It is important not to miss your dose. Call your care team if you are unable to keep an appointment. What may interact with this medication? Do not take this medication with any of the following: Live vaccines This medication may also interact with the following: Cisplatin This list may not describe all possible interactions. Give your health care provider a list of all the medicines, herbs, non-prescription drugs, or dietary supplements you use. Also tell them if you smoke, drink alcohol, or use illegal drugs. Some items may interact with your medicine. What should I watch for while using this medication? Your condition will be  monitored carefully while you are receiving this medication. You may need blood work while taking this medication. This medication can cause serious infusion reactions. To reduce the risk your care team may give you other medications to take before receiving this one. Be sure to follow the directions from your care team. This medication may increase your risk of getting an infection. Call your care team for advice if you get a fever, chills, sore throat, or other symptoms of a cold or flu. Do not treat yourself. Try to avoid being around people who are sick. Call your care team if you are around anyone with measles, chickenpox, or if you develop sores or blisters that do not heal properly. Avoid taking medications that contain aspirin, acetaminophen , ibuprofen, naproxen , or ketoprofen unless instructed by your care  team. These medications may hide a fever. This medication may cause serious skin reactions. They can happen weeks to months after starting the medication. Contact your care team right away if you notice fevers or flu-like symptoms with a rash. The rash may be red or purple and then turn into blisters or peeling of the skin. You may also notice a red rash with swelling of the face, lips, or lymph nodes in your neck or under your arms. In some patients, this medication may cause a serious brain infection that may cause death. If you have any problems seeing, thinking, speaking, walking, or standing, tell your care team right away. If you cannot reach your care team, urgently seek another source of medical care. Talk to your care team if you may be pregnant. Serious birth defects can occur if you take this medication during pregnancy and for 12 months after the last dose. You will need a negative pregnancy test before starting this medication. Contraception is recommended while taking this medication and for 12 months after the last dose. Your care team can help you find the option that works for  you. Do not breastfeed while taking this medication and for at least 6 months after the last dose. What side effects may I notice from receiving this medication? Side effects that you should report to your care team as soon as possible: Allergic reactions or angioedema--skin rash, itching or hives, swelling of the face, eyes, lips, tongue, arms, or legs, trouble swallowing or breathing Bowel blockage--stomach cramping, unable to have a bowel movement or pass gas, loss of appetite, vomiting Dizziness, loss of balance or coordination, confusion or trouble speaking Heart attack--pain or tightness in the chest, shoulders, arms, or jaw, nausea, shortness of breath, cold or clammy skin, feeling faint or lightheaded Heart rhythm changes--fast or irregular heartbeat, dizziness, feeling faint or lightheaded, chest pain, trouble breathing Infection--fever, chills, cough, sore throat, wounds that don't heal, pain or trouble when passing urine, general feeling of discomfort or being unwell Infusion reactions--chest pain, shortness of breath or trouble breathing, feeling faint or lightheaded Kidney injury--decrease in the amount of urine, swelling of the ankles, hands, or feet Liver injury--right upper belly pain, loss of appetite, nausea, light-colored stool, dark yellow or brown urine, yellowing skin or eyes, unusual weakness or fatigue Redness, blistering, peeling, or loosening of the skin, including inside the mouth Stomach pain that is severe, does not go away, or gets worse Tumor lysis syndrome (TLS)--nausea, vomiting, diarrhea, decrease in the amount of urine, dark urine, unusual weakness or fatigue, confusion, muscle pain or cramps, fast or irregular heartbeat, joint pain Side effects that usually do not require medical attention (report to your care team if they continue or are bothersome): Headache Joint pain Nausea Runny or stuffy nose Unusual weakness or fatigue This list may not describe all  possible side effects. Call your doctor for medical advice about side effects. You may report side effects to FDA at 1-800-FDA-1088. Where should I keep my medication? This medication is given in a hospital or clinic. It will not be stored at home. NOTE: This sheet is a summary. It may not cover all possible information. If you have questions about this medicine, talk to your doctor, pharmacist, or health care provider.  2024 Elsevier/Gold Standard (2021-09-16 00:00:00)  Doxorubicin  Injection What is this medication? DOXORUBICIN  (dox oh ROO bi sin) treats some types of cancer. It works by slowing down the growth of cancer cells. This medicine may be used for  other purposes; ask your health care provider or pharmacist if you have questions. COMMON BRAND NAME(S): Adriamycin , Adriamycin  PFS, Adriamycin  RDF, Rubex  What should I tell my care team before I take this medication? They need to know if you have any of these conditions: Heart disease History of low blood cell levels caused by a medication Liver disease Recent or ongoing radiation An unusual or allergic reaction to doxorubicin , other medications, foods, dyes, or preservatives If you or your partner are pregnant or trying to get pregnant Breast-feeding How should I use this medication? This medication is injected into a vein. It is given by your care team in a hospital or clinic setting. Talk to your care team about the use of this medication in children. Special care may be needed. Overdosage: If you think you have taken too much of this medicine contact a poison control center or emergency room at once. NOTE: This medicine is only for you. Do not share this medicine with others. What if I miss a dose? Keep appointments for follow-up doses. It is important not to miss your dose. Call your care team if you are unable to keep an appointment. What may interact with this medication? 6-mercaptopurine Paclitaxel Phenytoin [facility]. John's  wort Trastuzumab Verapamil This list may not describe all possible interactions. Give your health care provider a list of all the medicines, herbs, non-prescription drugs, or dietary supplements you use. Also tell them if you smoke, drink alcohol, or use illegal drugs. Some items may interact with your medicine. What should I watch for while using this medication? Your condition will be monitored carefully while you are receiving this medication. You may need blood work while taking this medication. This medication may make you feel generally unwell. This is not uncommon as chemotherapy can affect healthy cells as well as cancer cells. Report any side effects. Continue your course of treatment even though you feel ill unless your care team tells you to stop. There is a maximum amount of this medication you should receive throughout your life. The amount depends on the medical condition being treated and your overall health. Your care team will watch how much of this medication you receive. Tell your care team if you have taken this medication before. Your urine may turn red for a few days after your dose. This is not blood. If your urine is dark or brown, call your care team. In some cases, you may be given additional medications to help with side effects. Follow all directions for their use. This medication may increase your risk of getting an infection. Call your care team for advice if you get a fever, chills, sore throat, or other symptoms of a cold or flu. Do not treat yourself. Try to avoid being around people who are sick. This medication may increase your risk to bruise or bleed. Call your care team if you notice any unusual bleeding. Talk to your care team about your risk of cancer. You may be more at risk for certain types of cancers if you take this medication. Talk to your care team if you or your partner may be pregnant. Serious birth defects can occur if you take this medication during  pregnancy and for 6 months after the last dose. Contraception is recommended while taking this medication and for 6 months after the last dose. Your care team can help you find the option that works for you. If your partner can get pregnant, use a condom while taking this medication and  for 6 months after the last dose. Do not breastfeed while taking this medication. This medication may cause infertility. Talk to your care team if you are concerned about your fertility. What side effects may I notice from receiving this medication? Side effects that you should report to your care team as soon as possible: Allergic reactions--skin rash, itching, hives, swelling of the face, lips, tongue, or throat Heart failure--shortness of breath, swelling of the ankles, feet, or hands, sudden weight gain, unusual weakness or fatigue Heart rhythm changes--fast or irregular heartbeat, dizziness, feeling faint or lightheaded, chest pain, trouble breathing Infection--fever, chills, cough, sore throat, wounds that don't heal, pain or trouble when passing urine, general feeling of discomfort or being unwell Low red blood cell level--unusual weakness or fatigue, dizziness, headache, trouble breathing Painful swelling, warmth, or redness of the skin, blisters or sores at the infusion site Unusual bruising or bleeding Side effects that usually do not require medical attention (report to your care team if they continue or are bothersome): Diarrhea Hair loss Nausea Pain, redness, or swelling with sores inside the mouth or throat Red urine This list may not describe all possible side effects. Call your doctor for medical advice about side effects. You may report side effects to FDA at 1-800-FDA-1088. Where should I keep my medication? This medication is given in a hospital or clinic. It will not be stored at home. NOTE: This sheet is a summary. It may not cover all possible information. If you have questions about this  medicine, talk to your doctor, pharmacist, or health care provider.  2024 Elsevier/Gold Standard (2022-07-28 00:00:00)

## 2023-11-15 NOTE — Progress Notes (Signed)
 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  On the day of his colonoscopy patient noticed that he was jaundiced and therefore underwent labs which showed bilirubin of 14 on his CMP AST ALT of 70 and 108 and alkaline phosphatase of 246 respectively.  This was followed by MRI abdomen with MRCP with and without contrast which showed a large complex partially necrotic mass involving the central aspect of the liver.  Mass is involving segment 4A, segment 8 and the caudate lobe.  Also surrounds the gallbladder and is obstructing the biliary tree centrally near the confluence of the right and left hepatic ducts.  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  Compression and possible obstruction of the left portal vein.  The main portal vein is patent and the middle and right portal veins are patent.  There is tumor adjacent to and compressing the intrahepatic IVC but no evidence of occlusion.  Gastrohepatic ligament and celiac axis and periportal nodes concerning for metastatic disease.  Patient underwent ERCP by Dr. Edith on 08/23/2023 which showed a segmental biliary stricture that was malignant appearing and a plastic stent was placed.  Cells for cytology were obtained which was consistent with atypical glandular cells but not diagnostic for malignancy.   Patient  was seen by Dr. Romero from pancreaticobiliary surgery at [facility] and was not deemed to be a surgical candidate.  He was then seen by Dr. Donato from medical oncology who discussed the possibility that this could be locally advanced cholangiocarcinoma and discussed possible treatment options for the same.  However since patient did not have definitive tissue diagnosis liver biopsy was recommended.  He had a port placed in the meanwhile.  Liver biopsy surprisingly did not show any evidence of cholangiocarcinoma but was consistent with diffuse large B-cell lymphoma.   Diffuse large B cell lymphoma (DLBCL), germinal center B-cell like (GCB) subtype. See comment.   Comment: Histologic sections show needle core shaped tissue with diffuse infiltrate of large atypical lymphoid cells.  No normal liver parenchyma is identified.  The immunophenotype of the large atypical lymphoid cells are consistent with DLBCL GCB subtype per Mikle algorithm.  Staining for MYC and EBER was negative.83% of nuclei showed rearrangement of the MYC (8q24) locus by interphase FISH; 44-54% of nuclei showed additional, intact copies of the BCL6 locus (3q27) and BCL2 loci. No evidence of BCL2 rearrangement, BCL6 rearrangement, or IGH::MYC rearrangement was observed. MYC rearrangement in the absence of BCL2 or BCL6 gene rearrangements, is a nonspecific finding and may be seen in Burkitt lymphoma (BL) or diffuse large B-cell lymphoma, not otherwise specified (DLBCL, NOS).       Summary Table  Locus Result  MYC (8q24) 83% with MYC rearrangement  BCL2 (18q21) 54% with 3 intact copies No evidence of BCL2 rearrangement   BCL6 (3q27) 44% with 3 intact copies No evidence of BCL6 rearrangement       PET CT scan  showed multiple hypermetabolic mass in the liver and tracer avid abdominal lymph nodes compatible with lymphoma.  There was a thyroid  nodule 2.1 cm with an SUV of 6.6 and thyroid  ultrasound was recommended for the same.  Baseline  echocardiogram normal.  Given abnormal LFTs and GCB subtype plan is to proceed with R-CHOP chemotherapy instead of R Pola CHP chemotherapy.  IPI score 2-3 low- high intemediate risk (age greater than 60 and stage IV disease.  Extranodal sites-?2-given liver/contiguous gallbladder/ bile duct involvement)  Interval history-patient feels well overall today.  He has not had any further fever or chills.  His stents will be taken out in 3 weeks.  ECOG PS- 0 Pain scale- 0   Review of systems- Review of Systems  Constitutional:  Negative for chills, fever, malaise/fatigue and weight loss.  HENT:  Negative for congestion, ear discharge and nosebleeds.   Eyes:  Negative for blurred vision.  Respiratory:  Negative for cough, hemoptysis, sputum production, shortness of breath and wheezing.   Cardiovascular:  Negative for chest pain, palpitations, orthopnea and claudication.  Gastrointestinal:  Negative for abdominal pain, blood in stool, constipation, diarrhea, heartburn, melena, nausea and vomiting.  Genitourinary:  Negative for dysuria, flank pain, frequency, hematuria and urgency.  Musculoskeletal:  Negative for back pain, joint pain and myalgias.  Skin:  Negative for rash.  Neurological:  Negative for dizziness, tingling, focal weakness, seizures, weakness and headaches.  Endo/Heme/Allergies:  Does not bruise/bleed easily.  Psychiatric/Behavioral:  Negative for depression and suicidal ideas. The patient does not have insomnia.       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    GERD (gastroesophageal reflux disease)    Headache    Hyperlipidemia    Hypertension    Statin myopathy    Stroke (HCC)     Thyroid  nodule      Past Surgical History:  Procedure Laterality Date   COLONOSCOPY N/A 08/18/2023   Procedure: COLONOSCOPY;  Surgeon: Maryruth Ole DASEN, MD;  Location: [facility] ENDOSCOPY;  Service: Endoscopy;  Laterality: N/A;   ERCP N/A 08/23/2023   Procedure: ERCP, WITH INTERVENTION IF INDICATED;  Surgeon: Jinny Carmine, MD;  Location: [facility] ENDOSCOPY;  Service: Endoscopy;  Laterality: N/A;   ERCP N/A 10/03/2023   Procedure: ERCP, WITH INTERVENTION IF INDICATED;  Surgeon: Jinny Carmine, MD;  Location: [facility] ENDOSCOPY;  Service: Endoscopy;  Laterality: N/A;   MOHS SURGERY     2025, left cheek   POLYPECTOMY  08/18/2023   Procedure: POLYPECTOMY, INTESTINE;  Surgeon: Maryruth Ole DASEN, MD;  Location: [facility] ENDOSCOPY;  Service: Endoscopy;;   TONSILLECTOMY     1968    Social History   Socioeconomic History   Marital status: Divorced    Spouse name: Not on file   Number of children: Not on file   Years of education: Not on file   Highest education level: Not on file  Occupational History   Not on file  Tobacco Use   Smoking status: Former   Smokeless tobacco: Never  Vaping Use   Vaping status: Never Used  Substance and Sexual Activity   Alcohol use: Yes    Comment: occasional   Drug use: No   Sexual  activity: Not on file  Other Topics Concern   Not on file  Social History Narrative   Not on file   Social Drivers of Health   Financial Resource Strain: Low Risk  (07/05/2023)   Received from [facility]   Overall Financial Resource Strain (CARDIA)    Difficulty of Paying Living Expenses: Not hard at all  Food Insecurity: No Food Insecurity (09/20/2023)   Hunger Vital Sign    Worried About Running Out of Food in the Last Year: Never true    Ran Out of Food in the Last Year: Never true  Transportation Needs: No Transportation Needs (07/05/2023)   Received from [facility] - Transportation    In the past 12 months, has lack of  transportation kept you from medical appointments or from getting medications?: No    Lack of Transportation (Non-Medical): No  Physical Activity: Not on file  Stress: Not on file  Social Connections: Not on file  Intimate Partner Violence: Not At Risk (09/20/2023)   Humiliation, Afraid, Rape, and Kick questionnaire    Fear of Current or Ex-Partner: No    Emotionally Abused: No    Physically Abused: No    Sexually Abused: No    Family History  Problem Relation Age of Onset   Cancer Sister    Cancer Brother      Current Outpatient Medications:    acyclovir  (ZOVIRAX ) 400 MG tablet, Take 1 tablet (400 mg total) by mouth daily., Disp: 30 tablet, Rfl: 3   allopurinol  (ZYLOPRIM ) 300 MG tablet, Take 1 tablet (300 mg total) by mouth daily., Disp: 30 tablet, Rfl: 3   ALPRAZolam (XANAX) 1 MG tablet, Take 0.5 mg by mouth daily as needed for anxiety., Disp: , Rfl:    amphetamine-dextroamphetamine (ADDERALL XR) 20 MG 24 hr capsule, Take 20 mg by mouth daily., Disp: , Rfl:    aspirin 81 MG chewable tablet, Chew 81 mg by mouth daily., Disp: , Rfl:    atorvastatin (LIPITOR) 40 MG tablet, Take 40 mg by mouth daily. (Patient not taking: Reported on 10/25/2023), Disp: , Rfl:    buPROPion (WELLBUTRIN XL) 300 MG 24 hr tablet, Take 300 mg by mouth daily., Disp: , Rfl:    cetirizine (ZYRTEC) 10 MG tablet, Take 10 mg by mouth., Disp: , Rfl:    ezetimibe (ZETIA) 10 MG tablet, Take 1 tablet by mouth daily., Disp: , Rfl:    gabapentin  (NEURONTIN ) 100 MG capsule, Take 1 capsule (100 mg total) by mouth 3 (three) times daily. (Patient not taking: Reported on 10/25/2023), Disp: 30 capsule, Rfl: 0   lactulose  (CHRONULAC ) 10 GM/15ML solution, TAKE 15 MLS (10 G TOTAL) BY MOUTH AS NEEDED FOR MODERATE CONSTIPATION., Disp: 1416 mL, Rfl: 1   levofloxacin  (LEVAQUIN ) 750 MG tablet, Take 1 tablet (750 mg total) by mouth daily. (Patient not taking: Reported on 10/25/2023), Disp: 14 tablet, Rfl: 0   lidocaine -prilocaine  (EMLA )  cream, Apply to affected area once (Patient not taking: Reported on 10/25/2023), Disp: 30 g, Rfl: 3   meloxicam (MOBIC) 15 MG tablet, Take 15 mg by mouth daily., Disp: , Rfl:    metoprolol succinate (TOPROL-XL) 25 MG 24 hr tablet, Take 25 mg by mouth daily. (Patient not taking: Reported on 10/25/2023), Disp: , Rfl:    Na Sulfate-K Sulfate-Mg Sulfate concentrate (SUPREP) 17.5-3.13-1.6 GM/177ML SOLN, Take 354 mLs by mouth as directed. (Patient not taking: Reported on 10/25/2023), Disp: , Rfl:    naproxen  (NAPROSYN ) 500  MG tablet, Take 1 tablet (500 mg total) by mouth 2 (two) times daily with a meal. (Patient not taking: Reported on 10/25/2023), Disp: 20 tablet, Rfl: 0   omeprazole (PRILOSEC) 40 MG capsule, Take 40 mg by mouth daily., Disp: , Rfl:    ondansetron  (ZOFRAN  ODT) 4 MG disintegrating tablet, Take 1 tablet (4 mg total) by mouth every 8 (eight) hours as needed for nausea or vomiting., Disp: 20 tablet, Rfl: 0   ondansetron  (ZOFRAN ) 8 MG tablet, Take 1 tablet (8 mg total) by mouth every 8 (eight) hours as needed for nausea or vomiting. Start on the third day after cyclophosphamide  chemotherapy., Disp: 30 tablet, Rfl: 1   prochlorperazine  (COMPAZINE ) 10 MG tablet, Take 1 tablet (10 mg total) by mouth every 6 (six) hours as needed for nausea or vomiting., Disp: 30 tablet, Rfl: 6   sildenafil (VIAGRA) 100 MG tablet, Take by mouth., Disp: , Rfl:    telmisartan -hydrochlorothiazide  (MICARDIS  HCT) 80-25 MG tablet, Take 1 tablet by mouth daily., Disp: 90 tablet, Rfl: 0   traZODone (DESYREL) 50 MG tablet, Take 50 mg by mouth at bedtime., Disp: , Rfl:   Physical exam:  Vitals:   11/15/23 0846  BP: 123/84  Pulse: (!) 115  Resp: 20  Temp: (!) 97.3 F (36.3 C)  TempSrc: Tympanic  SpO2: 97%  Weight: 211 lb 6.4 oz (95.9 kg)   Physical Exam Cardiovascular:     Rate and Rhythm: Regular rhythm. Tachycardia present.     Heart sounds: Normal heart sounds.  Pulmonary:     Effort: Pulmonary effort is normal.      Breath sounds: Normal breath sounds.  Abdominal:     General: Bowel sounds are normal.     Palpations: Abdomen is soft.  Skin:    General: Skin is warm and dry.  Neurological:     Mental Status: He is alert and oriented to person, place, and time.      I have personally reviewed labs listed below:    Latest Ref Rng & Units 11/15/2023    8:35 AM  CMP  Glucose 70 - 99 mg/dL 782   BUN 8 - 23 mg/dL 18   Creatinine 9.38 - 1.24 mg/dL 8.89   Sodium 864 - 854 mmol/L 134   Potassium 3.5 - 5.1 mmol/L 3.6   Chloride 98 - 111 mmol/L 102   CO2 22 - 32 mmol/L 22   Calcium 8.9 - 10.3 mg/dL 9.5   Total Protein 6.5 - 8.1 g/dL 6.8   Total Bilirubin 0.0 - 1.2 mg/dL 0.7   Alkaline Phos 38 - 126 U/L 92   AST 15 - 41 U/L 23   ALT 0 - 44 U/L 23       Latest Ref Rng & Units 11/15/2023    8:35 AM  CBC  WBC 4.0 - 10.5 K/uL 29.9   Hemoglobin 13.0 - 17.0 g/dL 86.3   Hematocrit 60.9 - 52.0 % 39.1   Platelets 150 - 400 K/uL 337    I have personally reviewed Radiology images listed below: No images are attached to the encounter.  NM PET Image Restag (PS) Skull Base To Thigh Result Date: 11/14/2023 CLINICAL DATA:  Choose 4 treatment strategy for diffuse large B-cell lymphoma. EXAM: NUCLEAR MEDICINE PET SKULL BASE TO THIGH TECHNIQUE: 11.61 mCi F-18 FDG was injected intravenously. Full-ring PET imaging was performed from the skull base to thigh after the radiotracer. CT data was obtained and used for attenuation correction and anatomic localization. Fasting  blood glucose: 107 mg/dl COMPARISON:  PET-CT 94/80/7974.  Abdominal MRI 08/22/2023. FINDINGS: Mediastinal blood pool activity: SUV max 2.4 Liver activity: SUV max 3.8 NECK: No hypermetabolic cervical lymph nodes are identified.Fairly symmetric activity within the lymphoid tissue of Waldeyer's ring is within physiologic limits. No suspicious activity identified within the pharyngeal mucosal space. Incidental CT findings: Hypermetabolic right thyroid   nodule measuring 2.1 cm on image 38/6 is again noted with an SUV max of 5.4 (previously 6.6). Bilateral carotid atherosclerosis. CHEST: There are no hypermetabolic mediastinal, hilar or axillary lymph nodes. No hypermetabolic pulmonary activity or suspicious nodularity. Incidental CT findings: Right IJ Port-A-Cath extends into the inferior aspect of the right atrium. Three-vessel coronary artery atherosclerosis again noted. ABDOMEN/PELVIS: Interval marked improvement in the size and hypermetabolic activity within the previously demonstrated large mass involving the anterior aspect of the liver. Residual mass measures approximately 5.5 x 4.9 cm on image 70/6 and demonstrates a small amount of residual peripheral hypermetabolic activity (SUV max 5.6). Previously, this mass measured up to 8.7 x 7.5 cm and had an SUV max of 13.6. There is mild gallbladder wall thickening medially with low level linear metabolic activity (SUV max 5.7), improved from previous study and likely residual tumor. No hypermetabolic activity within the spleen, adrenal glands or pancreas. There is no hypermetabolic nodal activity in the abdomen or pelvis. A previously demonstrated hypermetabolic retrocaval lymph node currently measures 9 mm on image 94/6 and has an SUV max of 3.0 (previously 1.3 cm, SUV max 10.4). Incidental CT findings: Biliary stent remains in place with pneumobilia and low level activity along the stent. Chronic atrophy of the left hepatic lobe. Mild aortoiliac atherosclerosis. Moderate enlargement of the prostate gland. SKELETON: There is no hypermetabolic activity to suggest osseous metastatic disease. Incidental CT findings: none IMPRESSION: 1. Interval marked improvement in the size and hypermetabolic activity within the previously demonstrated large mass involving the anterior aspect of the liver. Residual mass demonstrates a small amount of peripheral hypermetabolic activity (Deauville 4). 2. Interval resolution of  hypermetabolic retrocaval lymph node. 3. No evidence of new or progressive disease. 4. Persistent hypermetabolic right thyroid  nodule. This is unlikely to be related to the patient's lymphoma. However, hypermetabolic thyroid  nodules on PET have up to 40-50% incidence of malignancy; recommend further evaluation with thyroid  ultrasound and possible US -guided fine needle aspiration. 5.  Aortic Atherosclerosis (ICD10-I70.0). Electronically Signed   By: Elsie Perone M.D.   On: 11/14/2023 16:38     Assessment and plan- Patient is a 63 y.o. male with history of stage IV diffuse large B-cell lymphoma GCB subtype not double hit.  He is here for on treatment assessment prior to cycle 3 of R-CHOP chemotherapy  I have reviewed PET CT scan images that were done after 2 cycles of chemotherapy independently and discussed findings with the patient just after 2 treatments patient has had a marked response to his disease in the liver.  Mass has shrunken from 8.7 cm to 5.5 cm and SUV down from 13.6-5.6.  No hypermetabolic nodal activity in the abdomen or pelvis.  Retrocaval lymph node has also decreased in size and SUV.  I plan is to therefore complete 4 remaining cycles of R-CHOP chemotherapy followed by repeat PET scan.  R-CHOP was chosen for chemotherapy instead of Pola R CHP given the GCB subtype and abnormal LFTs at presentation.  His LFTs have now normalized and therefore starting cycle 2 of R-CHOP he has been getting full dose of chemotherapy.  He will be seen  by NP Tinnie Dawn in 3 weeks for cycle 3 of R-CHOP chemotherapy and I will see him back in 6 weeks for cycle 5   Visit Diagnosis 1. Diffuse large B-cell lymphoma of solid organ excluding spleen   2. Encounter for antineoplastic chemotherapy   3. Encounter for monitoring rituximab  therapy      Dr. Annah Skene, MD, MPH [facility] at [facility] [phone] 11/15/2023 10:43 AM

## 2023-11-17 ENCOUNTER — Inpatient Hospital Stay: Payer: Self-pay

## 2023-11-17 DIAGNOSIS — C83398 Diffuse large b-cell lymphoma of other extranodal and solid organ sites: Secondary | ICD-10-CM

## 2023-11-17 MED ORDER — PEGFILGRASTIM-FPGK 6 MG/0.6ML ~~LOC~~ SOSY
6.0000 mg | PREFILLED_SYRINGE | Freq: Once | SUBCUTANEOUS | Status: AC
  Administered 2023-11-17: 6 mg via SUBCUTANEOUS
  Filled 2023-11-17: qty 0.6

## 2023-11-17 NOTE — Progress Notes (Signed)
 Patients own stimufend  dispensed 11/17/23  Reference [id number] tc script

## 2023-11-21 ENCOUNTER — Encounter: Payer: Self-pay | Admitting: Hospice and Palliative Medicine

## 2023-11-22 ENCOUNTER — Encounter: Payer: Self-pay | Admitting: Oncology

## 2023-11-22 ENCOUNTER — Encounter: Payer: Self-pay | Admitting: Hospice and Palliative Medicine

## 2023-11-22 NOTE — Telephone Encounter (Addendum)
 Per Josh have patient seen by [facility].  Secure chat sent to scheduling to coordinate; patient scheduled 11/23/23.

## 2023-11-23 ENCOUNTER — Other Ambulatory Visit: Payer: Self-pay

## 2023-11-23 ENCOUNTER — Inpatient Hospital Stay (HOSPITAL_BASED_OUTPATIENT_CLINIC_OR_DEPARTMENT_OTHER): Payer: Self-pay | Admitting: Hospice and Palliative Medicine

## 2023-11-23 ENCOUNTER — Encounter: Payer: Self-pay | Admitting: Hospice and Palliative Medicine

## 2023-11-23 ENCOUNTER — Other Ambulatory Visit

## 2023-11-23 ENCOUNTER — Inpatient Hospital Stay: Payer: Self-pay

## 2023-11-23 VITALS — BP 121/87 | HR 101 | Temp 97.7°F | Resp 16 | Wt 215.0 lb

## 2023-11-23 DIAGNOSIS — Z452 Encounter for adjustment and management of vascular access device: Secondary | ICD-10-CM

## 2023-11-23 DIAGNOSIS — C83398 Diffuse large b-cell lymphoma of other extranodal and solid organ sites: Secondary | ICD-10-CM

## 2023-11-23 DIAGNOSIS — J029 Acute pharyngitis, unspecified: Secondary | ICD-10-CM

## 2023-11-23 LAB — CBC WITH DIFFERENTIAL/PLATELET
Abs Immature Granulocytes: 0.09 K/uL — ABNORMAL HIGH (ref 0.00–0.07)
Basophils Absolute: 0.1 K/uL (ref 0.0–0.1)
Basophils Relative: 3 %
Eosinophils Absolute: 0.1 K/uL (ref 0.0–0.5)
Eosinophils Relative: 3 %
HCT: 34.6 % — ABNORMAL LOW (ref 39.0–52.0)
Hemoglobin: 11.5 g/dL — ABNORMAL LOW (ref 13.0–17.0)
Immature Granulocytes: 3 %
Lymphocytes Relative: 20 %
Lymphs Abs: 0.7 K/uL (ref 0.7–4.0)
MCH: 30.4 pg (ref 26.0–34.0)
MCHC: 33.2 g/dL (ref 30.0–36.0)
MCV: 91.5 fL (ref 80.0–100.0)
Monocytes Absolute: 0.4 K/uL (ref 0.1–1.0)
Monocytes Relative: 12 %
Neutro Abs: 2.1 K/uL (ref 1.7–7.7)
Neutrophils Relative %: 59 %
Platelets: 219 K/uL (ref 150–400)
RBC: 3.78 MIL/uL — ABNORMAL LOW (ref 4.22–5.81)
RDW: 16.3 % — ABNORMAL HIGH (ref 11.5–15.5)
WBC: 3.5 K/uL — ABNORMAL LOW (ref 4.0–10.5)
nRBC: 0 % (ref 0.0–0.2)

## 2023-11-23 LAB — COMPREHENSIVE METABOLIC PANEL WITH GFR
ALT: 25 U/L (ref 0–44)
AST: 22 U/L (ref 15–41)
Albumin: 3.8 g/dL (ref 3.5–5.0)
Alkaline Phosphatase: 87 U/L (ref 38–126)
Anion gap: 10 (ref 5–15)
BUN: 16 mg/dL (ref 8–23)
CO2: 25 mmol/L (ref 22–32)
Calcium: 9.1 mg/dL (ref 8.9–10.3)
Chloride: 97 mmol/L — ABNORMAL LOW (ref 98–111)
Creatinine, Ser: 0.78 mg/dL (ref 0.61–1.24)
GFR, Estimated: >60 mL/min (ref >60)
Glucose, Bld: 115 mg/dL — ABNORMAL HIGH (ref 70–99)
Potassium: 3.6 mmol/L (ref 3.5–5.1)
Sodium: 132 mmol/L — ABNORMAL LOW (ref 135–145)
Total Bilirubin: 0.6 mg/dL (ref 0.0–1.2)
Total Protein: 6.3 g/dL — ABNORMAL LOW (ref 6.5–8.1)

## 2023-11-23 MED ORDER — NYSTATIN 100000 UNIT/ML MT SUSP: 5.0000 mL | Freq: Four times a day (QID) | OROMUCOSAL | 0 refills | Status: AC

## 2023-11-23 NOTE — Progress Notes (Signed)
 Symptom Management Clinic [facility] at [facility] [phone] [phone]) [phone]  Patient Care Team: Auston Reyes BIRCH, MD as PCP - General (Internal Medicine) Melanee Annah BROCKS, MD as Consulting Physician (Oncology)   NAME OF PATIENT: Joel [name]  [id number]  03-31-1961   DATE OF VISIT: 11/23/23  REASON FOR CONSULT: Joel [name] is a 63 y.o. male with multiple medical problems including stage IV diffuse large B-cell lymphoma.   INTERVAL HISTORY: Patient received cycle 3 R-CHOP chemotherapy on 11/15/2023.  Patient presents to [facility] today for evaluation of sore throat.  Patient describes 4 days of sore throat, painful when he swallows.  He said family thought that his neck appeared swollen but he disagreed.  He denies fever or chills.  Has had some rhinorrhea.  Denies reflux symptoms.  Denies oral lesions.  No nausea or vomiting.  Oral intake has declined.  Denies any neurologic complaints. Denies chest pain. Denies any nausea, vomiting, constipation, or diarrhea. Denies urinary complaints. Patient offers no further specific complaints today.   PAST MEDICAL HISTORY: 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    GERD (gastroesophageal reflux disease)    Headache    Hyperlipidemia    Hypertension    Statin myopathy    Stroke (HCC)    Thyroid  nodule     PAST SURGICAL HISTORY:  Past Surgical History:  Procedure Laterality Date   COLONOSCOPY N/A 08/18/2023   Procedure: COLONOSCOPY;  Surgeon: Maryruth Ole DASEN, MD;  Location: [facility] ENDOSCOPY;  Service: Endoscopy;  Laterality: N/A;   ERCP N/A 08/23/2023   Procedure: ERCP, WITH INTERVENTION IF INDICATED;  Surgeon: Jinny Carmine, MD;  Location: [facility] ENDOSCOPY;  Service: Endoscopy;  Laterality: N/A;   ERCP N/A 10/03/2023    Procedure: ERCP, WITH INTERVENTION IF INDICATED;  Surgeon: Jinny Carmine, MD;  Location: [facility] ENDOSCOPY;  Service: Endoscopy;  Laterality: N/A;   MOHS SURGERY     2025, left cheek   POLYPECTOMY  08/18/2023   Procedure: POLYPECTOMY, INTESTINE;  Surgeon: Maryruth Ole DASEN, MD;  Location: [facility] ENDOSCOPY;  Service: Endoscopy;;   TONSILLECTOMY     1968    HEMATOLOGY/ONCOLOGY HISTORY:  Oncology History  Diffuse large B-cell lymphoma of solid organ excluding spleen  09/20/2023 Initial Diagnosis   DLBCL (diffuse large B cell lymphoma) (HCC)   09/20/2023 Cancer Staging   Staging form: Hodgkin and Non-Hodgkin Lymphoma, AJCC 8th Edition - Clinical stage from 09/20/2023: Stage IV (Diffuse large B-cell lymphoma) - Signed by Melanee Annah BROCKS, MD on 09/20/2023 Stage prefix: Initial diagnosis   10/04/2023 -  Chemotherapy   Patient is on Treatment Plan : NON-HODGKINS LYMPHOMA R-CHOP q21d       ALLERGIES:  is allergic to hydrocodone-acetaminophen  and penicillins.  MEDICATIONS:  Current Outpatient Medications  Medication Sig Dispense Refill   acyclovir  (ZOVIRAX ) 400 MG tablet Take 1 tablet (400 mg total) by mouth daily. 30 tablet 3   allopurinol  (ZYLOPRIM ) 300 MG tablet Take 1 tablet (300 mg total) by mouth daily. 30 tablet 3   ALPRAZolam (XANAX) 1 MG tablet Take 0.5 mg by mouth daily as needed for anxiety.     amphetamine-dextroamphetamine (ADDERALL XR) 20 MG 24 hr capsule Take 20 mg by mouth daily.     aspirin 81 MG chewable tablet Chew 81 mg by mouth daily.  atorvastatin (LIPITOR) 40 MG tablet Take 40 mg by mouth daily. (Patient not taking: Reported on 11/15/2023)     buPROPion (WELLBUTRIN XL) 300 MG 24 hr tablet Take 300 mg by mouth daily.     cetirizine (ZYRTEC) 10 MG tablet Take 10 mg by mouth. (Patient not taking: Reported on 11/15/2023)     ezetimibe (ZETIA) 10 MG tablet Take 1 tablet by mouth daily.     gabapentin  (NEURONTIN ) 100 MG capsule Take 1 capsule (100 mg total) by mouth 3 (three) times  daily. (Patient not taking: Reported on 10/25/2023) 30 capsule 0   lactulose  (CHRONULAC ) 10 GM/15ML solution TAKE 15 MLS (10 G TOTAL) BY MOUTH AS NEEDED FOR MODERATE CONSTIPATION. 1416 mL 1   levofloxacin  (LEVAQUIN ) 750 MG tablet Take 1 tablet (750 mg total) by mouth daily. (Patient not taking: Reported on 10/25/2023) 14 tablet 0   lidocaine -prilocaine  (EMLA ) cream Apply to affected area once (Patient not taking: Reported on 10/25/2023) 30 g 3   meloxicam (MOBIC) 15 MG tablet Take 15 mg by mouth daily. (Patient not taking: Reported on 11/15/2023)     metoprolol succinate (TOPROL-XL) 25 MG 24 hr tablet Take 25 mg by mouth daily. (Patient not taking: Reported on 10/25/2023)     Na Sulfate-K Sulfate-Mg Sulfate concentrate (SUPREP) 17.5-3.13-1.6 GM/177ML SOLN Take 354 mLs by mouth as directed. (Patient not taking: Reported on 10/25/2023)     naproxen  (NAPROSYN ) 500 MG tablet Take 1 tablet (500 mg total) by mouth 2 (two) times daily with a meal. (Patient not taking: Reported on 10/25/2023) 20 tablet 0   omeprazole (PRILOSEC) 40 MG capsule Take 40 mg by mouth daily.     ondansetron  (ZOFRAN  ODT) 4 MG disintegrating tablet Take 1 tablet (4 mg total) by mouth every 8 (eight) hours as needed for nausea or vomiting. 20 tablet 0   ondansetron  (ZOFRAN ) 8 MG tablet Take 1 tablet (8 mg total) by mouth every 8 (eight) hours as needed for nausea or vomiting. Start on the third day after cyclophosphamide  chemotherapy. 30 tablet 1   predniSONE  (DELTASONE ) 50 MG tablet Take 100 mg by mouth daily with breakfast. Take for 5 days coinciding with chemotherapy treatment     prochlorperazine  (COMPAZINE ) 10 MG tablet Take 1 tablet (10 mg total) by mouth every 6 (six) hours as needed for nausea or vomiting. 30 tablet 6   sildenafil (VIAGRA) 100 MG tablet Take by mouth. (Patient not taking: Reported on 11/15/2023)     telmisartan -hydrochlorothiazide  (MICARDIS  HCT) 80-25 MG tablet Take 1 tablet by mouth daily. 90 tablet 0   traZODone  (DESYREL) 50 MG tablet Take 50 mg by mouth at bedtime.     No current facility-administered medications for this visit.    VITAL SIGNS: There were no vitals taken for this visit. There were no vitals filed for this visit.  Estimated body mass index is 34.12 kg/m as calculated from the following:   Height as of 10/25/23: 5' 6 (1.676 m).   Weight as of 11/15/23: 211 lb 6.4 oz (95.9 kg).  LABS: CBC:    Component Value Date/Time   WBC 29.9 (H) 11/15/2023 0835   WBC 6.0 10/12/2023 1647   HGB 13.6 11/15/2023 0835   HCT 39.1 11/15/2023 0835   PLT 337 11/15/2023 0835   MCV 90.1 11/15/2023 0835   NEUTROABS 27.8 (H) 11/15/2023 0835   LYMPHSABS 0.9 11/15/2023 0835   MONOABS 0.7 11/15/2023 0835   EOSABS 0.0 11/15/2023 0835   BASOSABS 0.1 11/15/2023 0835  Comprehensive Metabolic Panel:    Component Value Date/Time   NA 134 (L) 11/15/2023 0835   K 3.6 11/15/2023 0835   CL 102 11/15/2023 0835   CO2 22 11/15/2023 0835   BUN 18 11/15/2023 0835   CREATININE 1.10 11/15/2023 0835   GLUCOSE 217 (H) 11/15/2023 0835   CALCIUM 9.5 11/15/2023 0835   AST 23 11/15/2023 0835   ALT 23 11/15/2023 0835   ALKPHOS 92 11/15/2023 0835   BILITOT 0.7 11/15/2023 0835   PROT 6.8 11/15/2023 0835   ALBUMIN 3.8 11/15/2023 0835    RADIOGRAPHIC STUDIES: NM PET Image Restag (PS) Skull Base To Thigh Result Date: 11/14/2023 CLINICAL DATA:  Choose 4 treatment strategy for diffuse large B-cell lymphoma. EXAM: NUCLEAR MEDICINE PET SKULL BASE TO THIGH TECHNIQUE: 11.61 mCi F-18 FDG was injected intravenously. Full-ring PET imaging was performed from the skull base to thigh after the radiotracer. CT data was obtained and used for attenuation correction and anatomic localization. Fasting blood glucose: 107 mg/dl COMPARISON:  PET-CT 94/80/7974.  Abdominal MRI 08/22/2023. FINDINGS: Mediastinal blood pool activity: SUV max 2.4 Liver activity: SUV max 3.8 NECK: No hypermetabolic cervical lymph nodes are identified.Fairly  symmetric activity within the lymphoid tissue of Waldeyer's ring is within physiologic limits. No suspicious activity identified within the pharyngeal mucosal space. Incidental CT findings: Hypermetabolic right thyroid  nodule measuring 2.1 cm on image 38/6 is again noted with an SUV max of 5.4 (previously 6.6). Bilateral carotid atherosclerosis. CHEST: There are no hypermetabolic mediastinal, hilar or axillary lymph nodes. No hypermetabolic pulmonary activity or suspicious nodularity. Incidental CT findings: Right IJ Port-A-Cath extends into the inferior aspect of the right atrium. Three-vessel coronary artery atherosclerosis again noted. ABDOMEN/PELVIS: Interval marked improvement in the size and hypermetabolic activity within the previously demonstrated large mass involving the anterior aspect of the liver. Residual mass measures approximately 5.5 x 4.9 cm on image 70/6 and demonstrates a small amount of residual peripheral hypermetabolic activity (SUV max 5.6). Previously, this mass measured up to 8.7 x 7.5 cm and had an SUV max of 13.6. There is mild gallbladder wall thickening medially with low level linear metabolic activity (SUV max 5.7), improved from previous study and likely residual tumor. No hypermetabolic activity within the spleen, adrenal glands or pancreas. There is no hypermetabolic nodal activity in the abdomen or pelvis. A previously demonstrated hypermetabolic retrocaval lymph node currently measures 9 mm on image 94/6 and has an SUV max of 3.0 (previously 1.3 cm, SUV max 10.4). Incidental CT findings: Biliary stent remains in place with pneumobilia and low level activity along the stent. Chronic atrophy of the left hepatic lobe. Mild aortoiliac atherosclerosis. Moderate enlargement of the prostate gland. SKELETON: There is no hypermetabolic activity to suggest osseous metastatic disease. Incidental CT findings: none IMPRESSION: 1. Interval marked improvement in the size and hypermetabolic  activity within the previously demonstrated large mass involving the anterior aspect of the liver. Residual mass demonstrates a small amount of peripheral hypermetabolic activity (Deauville 4). 2. Interval resolution of hypermetabolic retrocaval lymph node. 3. No evidence of new or progressive disease. 4. Persistent hypermetabolic right thyroid  nodule. This is unlikely to be related to the patient's lymphoma. However, hypermetabolic thyroid  nodules on PET have up to 40-50% incidence of malignancy; recommend further evaluation with thyroid  ultrasound and possible US -guided fine needle aspiration. 5.  Aortic Atherosclerosis (ICD10-I70.0). Electronically Signed   By: Elsie Perone M.D.   On: 11/14/2023 16:38    PERFORMANCE STATUS (ECOG) : 1 - Symptomatic but completely ambulatory  Review of Systems Unless otherwise noted, a complete review of systems is negative.  Physical Exam General: NAD HEENT: No oral lesions or thrush, no exudate, no cervical lymphadenopathy Cardiovascular: regular rate and rhythm Pulmonary: clear anterior/posterior fields Abdomen: soft, nontender, + bowel sounds GU: no suprapubic tenderness Extremities: no edema, no joint deformities Skin: no rashes Neurological: nonfocal  IMPRESSION/PLAN: DLBCL -status post cycle 3 R-CHOP.    Dehydration  - patient mildly hyponatremic and was offered IV fluids but declined.  He says he will increase oral intake.  Sore throat -no clear findings on exam.  Neck does not appear swollen.  There is no redness or rashes.  No obvious oral thrush.  Patient did have recent PET scan with overall disease improvement.  Patient's port is located on the right side, as is his pain.  Will obtain ultrasound for further evaluation.  Will start empirically on Nystatin .  Could consider empirically covering with an antibiotic if symptoms persist or worsen.  Case and plan discussed with Dr. Babara, who is covering for Dr. Melanee.  MD visit in 2 weeks, patient to  be seen sooner if needed.  Patient expressed understanding and was in agreement with this plan. He also understands that He can call clinic at any time with any questions, concerns, or complaints.   Thank you for allowing me to participate in the care of this very pleasant patient.   Time Total: 20 minutes  Visit consisted of counseling and education dealing with the complex and emotionally intense issues of symptom management in the setting of serious illness.Greater than 50%  of this time was spent counseling and coordinating care related to the above assessment and plan.  Signed by: Fonda Mower, PhD, NP-C

## 2023-11-24 ENCOUNTER — Other Ambulatory Visit: Payer: Self-pay | Admitting: Hospice and Palliative Medicine

## 2023-11-24 ENCOUNTER — Ambulatory Visit
Admission: RE | Admit: 2023-11-24 | Discharge: 2023-11-24 | Disposition: A | Discharge status: Home / Self Care | Payer: Self-pay | Source: Ambulatory Visit | Attending: Hospice and Palliative Medicine | Admitting: Hospice and Palliative Medicine

## 2023-11-24 ENCOUNTER — Inpatient Hospital Stay: Payer: Self-pay

## 2023-11-24 ENCOUNTER — Telehealth: Payer: Self-pay | Admitting: *Deleted

## 2023-11-24 DIAGNOSIS — J029 Acute pharyngitis, unspecified: Secondary | ICD-10-CM

## 2023-11-24 DIAGNOSIS — C83398 Diffuse large b-cell lymphoma of other extranodal and solid organ sites: Secondary | ICD-10-CM | POA: Insufficient documentation

## 2023-11-24 DIAGNOSIS — I82621 Acute embolism and thrombosis of deep veins of right upper extremity: Secondary | ICD-10-CM

## 2023-11-24 DIAGNOSIS — E041 Nontoxic single thyroid nodule: Secondary | ICD-10-CM

## 2023-11-24 DIAGNOSIS — Z452 Encounter for adjustment and management of vascular access device: Secondary | ICD-10-CM | POA: Insufficient documentation

## 2023-11-24 DIAGNOSIS — E860 Dehydration: Secondary | ICD-10-CM

## 2023-11-24 MED ORDER — APIXABAN (ELIQUIS) VTE STARTER PACK (10MG AND 5MG): ORAL_TABLET | ORAL | 0 refills | Status: DC

## 2023-11-24 MED ORDER — SODIUM CHLORIDE 0.9 % IV SOLN
INTRAVENOUS | Status: DC
  Filled 2023-11-24 (×2): qty 250

## 2023-11-24 MED ORDER — HEPARIN SOD (PORK) LOCK FLUSH 100 UNIT/ML IV SOLN
500.0000 [IU] | Freq: Once | INTRAVENOUS | Status: AC
  Administered 2023-11-24: 500 [IU] via INTRAVENOUS
  Filled 2023-11-24: qty 5

## 2023-11-24 NOTE — Progress Notes (Signed)
 Ultrasound revealed right jugular/subclavian DVT.  Discussed with Dr. Rennie, who is covering for Dr. Melanee.  Will start patient on Eliquis starter pack. I reached out to patient with instructions. Discussed bleeding precautions as well as ED triggers.   Message sent to vascular.

## 2023-11-24 NOTE — Addendum Note (Signed)
 Addended by: SHERLEEN CHEW R on: 11/24/2023 04:08 PM   Modules accepted: Orders

## 2023-11-24 NOTE — Telephone Encounter (Signed)
 critical scan and they need to have Josh to speak to them . Is is urgent at [phone].  I sent a secure chat also about this.

## 2023-11-27 ENCOUNTER — Encounter: Payer: Self-pay | Admitting: Oncology

## 2023-11-27 ENCOUNTER — Encounter: Payer: Self-pay | Admitting: Hospice and Palliative Medicine

## 2023-11-27 ENCOUNTER — Encounter: Admitting: Hospice and Palliative Medicine

## 2023-11-27 ENCOUNTER — Telehealth: Payer: Self-pay

## 2023-11-27 NOTE — Telephone Encounter (Signed)
 Called check on patient to see how he is doing since being on Eliquis . Patient states he is doing fine. Also, called patient per Josh to move appt from today to either Thursday or Friday 2 or 2:30 pm. Patient aware. Advised we would change appt and our schedulers will be contact with him to notify of appt date and time

## 2023-11-27 NOTE — Telephone Encounter (Signed)
 Patient scheduled w/ [facility] today 11/27/23.  No further follow up needed.

## 2023-11-29 ENCOUNTER — Inpatient Hospital Stay (HOSPITAL_BASED_OUTPATIENT_CLINIC_OR_DEPARTMENT_OTHER): Payer: Self-pay | Admitting: Hospice and Palliative Medicine

## 2023-11-29 DIAGNOSIS — I82621 Acute embolism and thrombosis of deep veins of right upper extremity: Secondary | ICD-10-CM

## 2023-11-29 NOTE — Progress Notes (Signed)
 Virtual Visit via Telephone Note  I connected with Joel [name] on 11/29/23 at  3:30 PM EDT by telephone and verified that I am speaking with the correct person using two identifiers.  Location: Patient: Home Provider: Clinic   I discussed the limitations, risks, security and privacy concerns of performing an evaluation and management service by telephone and the availability of in person appointments. I also discussed with the patient that there may be a patient responsible charge related to this service. The patient expressed understanding and agreed to proceed.   History of Present Illness: Joel [name] is a 63 y.o. male with multiple medical problems including stage IV diffuse large B-cell lymphoma.    Patient was seen in [facility] on 11/23/2023 with complaint of sore throat and subjective swelling.  He was sent for ultrasound and found to have jugular vein DVT.  Patient was started on Eliquis  and referral pending to vascular surgery.   Observations/Objective: Spoke with patient for follow-up.  Patient states he is doing okay.  Denies significant changes or concerns.  He does report some pain in the neck, which radiates up the head but it does not sound like it has changed in characteristic or severity.  Patient denies visual changes.  Denies weakness.  Denies fever or chills.  Denies shortness of breath or chest pain.  Denies bleeding.  Patient feels he is tolerating Eliquis  well.  Reviewed results of thyroid  ultrasound.  Patient reports that he has previously had thyroid  mass biopsied in the past.  He feels that results were benign.  Assessment and Plan: Stage IV diffuse large B-cell lymphoma -patient has follow-up next week with Dr. Rennie  Jugular vein DVT -continue Eliquis .  Patient is pending vascular evaluation next week.  ED triggers again reviewed with patient.  Case and plan discussed with Dr. Rennie (covering for Dr. Melanee).   Follow Up Instructions: MD  follow-up next week   I discussed the assessment and treatment plan with the patient. The patient was provided an opportunity to ask questions and all were answered. The patient agreed with the plan and demonstrated an understanding of the instructions.   The patient was advised to call back or seek an in-person evaluation if the symptoms worsen or if the condition fails to improve as anticipated.  I provided 10 minutes of non-face-to-face time during this encounter.   FONDA JONELLE MOWER, NP

## 2023-11-30 ENCOUNTER — Encounter: Payer: Self-pay | Admitting: Oncology

## 2023-11-30 ENCOUNTER — Telehealth: Payer: Self-pay

## 2023-11-30 ENCOUNTER — Encounter: Payer: Self-pay | Admitting: Hospice and Palliative Medicine

## 2023-11-30 ENCOUNTER — Inpatient Hospital Stay: Payer: Self-pay | Admitting: Hospice and Palliative Medicine

## 2023-11-30 NOTE — Telephone Encounter (Signed)
-----   Message from Cindy JONELLE Joe sent at 11/29/2023  6:10 PM EDT ----- Regarding: RE: Blood thinner Ok to wait-Thank you GB ----- Message ----- From: Sherleen Fonda JONELLE, NP Sent: 11/28/2023  12:25 PM EDT To: Andrea CINDERELLA Bench, CMA; Govinda R Brahman# Subject: RE: Blood thinner                              Dr. KATHEE - what would you recommend? This is the patient that was started on anticoagulation for jugular vein DVT we discussed last week. ----- Message ----- From: Bench Andrea CINDERELLA, CMA Sent: 11/28/2023  12:20 PM EDT To: Fonda JONELLE Borders, NP Subject: Blood thinner                                  Good afternoon,  This patient reached out regarding some new findings. They were originally scheduled for ERCP stent removal on 12/04/2023. I spoke with Dr. Jinny, who will be performing the procedure, and he advised that the blood thinner will not need to be held, as no cutting will be involved.  Given the recent developments, would it be best to reschedule the procedure another month out to be safe?  Please advise.   Thanks,  Jaysiah Marchetta,CMA

## 2023-11-30 NOTE — Telephone Encounter (Signed)
 Will need to wait before r/s ERCP for stent removal

## 2023-12-04 ENCOUNTER — Ambulatory Visit: Admission: RE | Admit: 2023-12-04 | Payer: MEDICAID | Source: Home / Self Care | Admitting: Gastroenterology

## 2023-12-04 SURGERY — ERCP, WITH INTERVENTION IF INDICATED
Anesthesia: General

## 2023-12-05 ENCOUNTER — Encounter (INDEPENDENT_AMBULATORY_CARE_PROVIDER_SITE_OTHER): Payer: Self-pay | Admitting: Vascular Surgery

## 2023-12-05 ENCOUNTER — Encounter: Payer: Self-pay | Admitting: Hospice and Palliative Medicine

## 2023-12-05 ENCOUNTER — Encounter: Payer: Self-pay | Admitting: Oncology

## 2023-12-05 ENCOUNTER — Ambulatory Visit (INDEPENDENT_AMBULATORY_CARE_PROVIDER_SITE_OTHER): Payer: Self-pay | Admitting: Vascular Surgery

## 2023-12-05 VITALS — BP 126/86 | HR 86 | Ht 66.0 in | Wt 210.5 lb

## 2023-12-05 DIAGNOSIS — C83398 Diffuse large b-cell lymphoma of other extranodal and solid organ sites: Secondary | ICD-10-CM

## 2023-12-05 DIAGNOSIS — I10 Essential (primary) hypertension: Secondary | ICD-10-CM

## 2023-12-05 DIAGNOSIS — I82890 Acute embolism and thrombosis of other specified veins: Secondary | ICD-10-CM | POA: Insufficient documentation

## 2023-12-05 MED FILL — Fosaprepitant Dimeglumine For IV Infusion 150 MG (Base Eq): INTRAVENOUS | Qty: 5 | Status: AC

## 2023-12-05 NOTE — Assessment & Plan Note (Signed)
 blood pressure control important in reducing the progression of atherosclerotic disease. On appropriate oral medications.

## 2023-12-05 NOTE — Progress Notes (Signed)
 Patient ID: Joel [name], male   DOB: 11-Jul-1960, 63 y.o.   MRN: [id number]  Chief Complaint  Patient presents with   *Urgent. np. consult. US  done 11/24/23. Eval jugular dvt/por    HPI Joel [name] is a 63 y.o. male.  I am asked to see the patient by Fonda Mower for evaluation of a right jugular and subclavian vein DVT associated with right-sided Port-A-Cath.  He is still using the port and it has been used now for his chemotherapy treatments.  He is actually scheduled to have it used again tomorrow.  A little over a week ago, he began noticing pain in his right neck and shoulder area.  This is associated with swelling.  This prompted an ultrasound done on 7/18 which I have reviewed.  This demonstrates thrombus in the right jugular vein and proximal subclavian vein clearly associated with the Port-A-Cath.  He was appropriately started on Eliquis .  The pain and swelling has improved but not entirely resolved since that time.  The port is still functional and is being used for chemotherapy.     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    GERD (gastroesophageal reflux disease)    Headache    Hyperlipidemia    Hypertension    Statin myopathy    Stroke [facility])    Thyroid  nodule     Past Surgical History:  Procedure Laterality Date   COLONOSCOPY N/A 08/18/2023   Procedure: COLONOSCOPY;  Surgeon: Maryruth Ole DASEN, MD;  Location: [facility] ENDOSCOPY;  Service: Endoscopy;  Laterality: N/A;   ERCP N/A 08/23/2023   Procedure: ERCP, WITH INTERVENTION IF INDICATED;  Surgeon: Jinny Carmine, MD;  Location: [facility] ENDOSCOPY;  Service: Endoscopy;  Laterality: N/A;   ERCP N/A 10/03/2023   Procedure: ERCP, WITH INTERVENTION IF INDICATED;  Surgeon: Jinny Carmine, MD;  Location: [facility] ENDOSCOPY;  Service: Endoscopy;  Laterality: N/A;    MOHS SURGERY     2025, left cheek   POLYPECTOMY  08/18/2023   Procedure: POLYPECTOMY, INTESTINE;  Surgeon: Maryruth Ole DASEN, MD;  Location: [facility] ENDOSCOPY;  Service: Endoscopy;;   TONSILLECTOMY     1968     Family History  Problem Relation Age of Onset   Cancer Sister    Cancer Brother   No bleeding or clotting disorders   Social History   Tobacco Use   Smoking status: Former   Smokeless tobacco: Never  Vaping Use   Vaping status: Never Used  Substance Use Topics   Alcohol use: Yes    Comment: occasional   Drug use: No     Allergies  Allergen Reactions   Hydrocodone-Acetaminophen  Other (See Comments)   Penicillins     Childhood allergy    Current Outpatient Medications  Medication Sig Dispense Refill   acyclovir  (ZOVIRAX ) 400 MG tablet Take 1 tablet (400 mg total) by mouth daily. 30 tablet 3   allopurinol  (ZYLOPRIM ) 300 MG tablet Take 1 tablet (300 mg total) by mouth daily. 30 tablet 3   ALPRAZolam (XANAX) 1 MG tablet Take 0.5 mg by mouth daily as needed for anxiety.     amphetamine-dextroamphetamine (ADDERALL XR) 20 MG 24 hr capsule Take 20 mg by mouth daily.     APIXABAN  (ELIQUIS ) VTE STARTER PACK (10MG  AND 5MG ) Take as directed on package: start with two-5mg   tablets twice daily for 7 days. On day 8, switch to one-5mg  tablet twice daily. 74 each 0   buPROPion (WELLBUTRIN XL) 300 MG 24 hr tablet Take 300 mg by mouth daily.     ezetimibe (ZETIA) 10 MG tablet Take 1 tablet by mouth daily.     lactulose  (CHRONULAC ) 10 GM/15ML solution TAKE 15 MLS (10 G TOTAL) BY MOUTH AS NEEDED FOR MODERATE CONSTIPATION. 1416 mL 1   levofloxacin  (LEVAQUIN ) 750 MG tablet Take 1 tablet (750 mg total) by mouth daily. 14 tablet 0   lidocaine -prilocaine  (EMLA ) cream Apply to affected area once 30 g 3   metoprolol succinate (TOPROL-XL) 25 MG 24 hr tablet Take 25 mg by mouth daily.     naproxen  (NAPROSYN ) 500 MG tablet Take 1 tablet (500 mg total) by mouth 2 (two) times daily with a meal.  20 tablet 0   nystatin  (MYCOSTATIN ) 100000 UNIT/ML suspension Take 5 mLs (500,000 Units total) by mouth 4 (four) times daily. 60 mL 0   omeprazole (PRILOSEC) 40 MG capsule Take 40 mg by mouth daily.     ondansetron  (ZOFRAN  ODT) 4 MG disintegrating tablet Take 1 tablet (4 mg total) by mouth every 8 (eight) hours as needed for nausea or vomiting. 20 tablet 0   ondansetron  (ZOFRAN ) 8 MG tablet Take 1 tablet (8 mg total) by mouth every 8 (eight) hours as needed for nausea or vomiting. Start on the third day after cyclophosphamide  chemotherapy. 30 tablet 1   predniSONE  (DELTASONE ) 50 MG tablet Take 100 mg by mouth daily with breakfast. Take for 5 days coinciding with chemotherapy treatment     prochlorperazine  (COMPAZINE ) 10 MG tablet Take 1 tablet (10 mg total) by mouth every 6 (six) hours as needed for nausea or vomiting. 30 tablet 6   sildenafil (VIAGRA) 100 MG tablet Take by mouth.     telmisartan -hydrochlorothiazide  (MICARDIS  HCT) 80-25 MG tablet Take 1 tablet by mouth daily. 90 tablet 0   traZODone (DESYREL) 50 MG tablet Take 50 mg by mouth at bedtime.     atorvastatin (LIPITOR) 40 MG tablet Take 40 mg by mouth daily. (Patient not taking: Reported on 12/05/2023)     cetirizine (ZYRTEC) 10 MG tablet Take 10 mg by mouth. (Patient not taking: Reported on 12/05/2023)     Na Sulfate-K Sulfate-Mg Sulfate concentrate (SUPREP) 17.5-3.13-1.6 GM/177ML SOLN Take 354 mLs by mouth as directed. (Patient not taking: Reported on 11/23/2023)     No current facility-administered medications for this visit.      REVIEW OF SYSTEMS (Negative unless checked)  Constitutional: [] Weight loss  [] Fever  [] Chills Cardiac: [] Chest pain   [] Chest pressure   [] Palpitations   [] Shortness of breath when laying flat   [] Shortness of breath at rest   [] Shortness of breath with exertion. Vascular:  [] Pain in legs with walking   [] Pain in legs at rest   [] Pain in legs when laying flat   [] Claudication   [] Pain in feet when walking   [] Pain in feet at rest  [] Pain in feet when laying flat   [] History of DVT   [] Phlebitis   [] Swelling in legs   [] Varicose veins   [] Non-healing ulcers Pulmonary:   [] Uses home oxygen   [] Productive cough   [] Hemoptysis   [] Wheeze  [] COPD   [] Asthma Neurologic:  [] Dizziness  [] Blackouts   [] Seizures   [] History of stroke   [] History of TIA  [] Aphasia   [] Temporary blindness   [] Dysphagia   [] Weakness or numbness in arms   []   Weakness or numbness in legs Musculoskeletal:  [] Arthritis   [] Joint swelling   [] Joint pain   [] Low back pain Hematologic:  [] Easy bruising  [] Easy bleeding   [] Hypercoagulable state   [] Anemic  [] Hepatitis Gastrointestinal:  [] Blood in stool   [] Vomiting blood  [] Gastroesophageal reflux/heartburn   [] Abdominal pain Genitourinary:  [] Chronic kidney disease   [] Difficult urination  [] Frequent urination  [] Burning with urination   [] Hematuria Skin:  [] Rashes   [] Ulcers   [] Wounds Psychological:  [] History of anxiety   []  History of major depression.    Physical Exam BP 126/86   Pulse 86   Ht 5' 6 (1.676 m)   Wt 210 lb 8 oz (95.5 kg)   BMI 33.98 kg/m  Gen:  WD/WN, NAD Head: [place]/AT, No temporalis wasting. Ear/Nose/Throat: Hearing grossly intact, nares w/o erythema or drainage, oropharynx w/o Erythema/Exudate Eyes: Conjunctiva clear, sclera non-icteric  Neck: trachea midline.  No JVD.  Pulmonary:  Good air movement, respirations not labored, no use of accessory muscles  Cardiac: RRR, no JVD Vascular: Port in right chest without erythema or drainage.  Not particularly tender.  No appreciable redness or swelling of the neck today. Vessel Right Left  Radial Palpable Palpable                                   Gastrointestinal:. No masses, surgical incisions, or scars. Musculoskeletal: M/S 5/5 throughout.  Extremities without ischemic changes.  No deformity or atrophy. Trace LE edema. Neurologic: Sensation grossly intact in extremities.  Symmetrical.  Speech is  fluent. Motor exam as listed above. Psychiatric: Judgment intact, Mood & affect appropriate for pt's clinical situation. Dermatologic: No rashes or ulcers noted.  No cellulitis or open wounds.    Radiology US  THYROID  Result Date: 11/29/2023 CLINICAL DATA:  Incidental on PET. EXAM: THYROID  ULTRASOUND TECHNIQUE: Ultrasound examination of the thyroid  gland and adjacent soft tissues was performed. COMPARISON:  PET CT 11/14/2023 FINDINGS: Parenchymal Echotexture: Mildly heterogenous Isthmus: 0.5 cm Right lobe: 5.7 x 1.6 x 1.6 cm Left lobe: 3.9 x 1.2 x 1.1 cm _________________________________________________________ Estimated total number of nodules >/= 1 cm: 1 Number of spongiform nodules >/=  2 cm not described below (TR1): 0 Number of mixed cystic and solid nodules >/= 1.5 cm not described below (TR2): 0 _________________________________________________________ Nodule # 1: Hypoechoic solid nodule with internal dystrophic calcifications in the right lower gland measures 2.7 x 2.4 x 1.9 cm. This corresponds with the mildly hypermetabolic nodule seen on prior PET-CT. Hypermetabolism on PET supersedes the TI-RADS classification system. This nodule warrants biopsy. IMPRESSION: Positive for a 2.7 cm solid hypoechoic nodule in the right inferior gland corresponding with the hypermetabolic lesion seen on the prior PET-CT. Biopsy is recommended. Electronically Signed   By: Wilkie Lent M.D.   On: 11/29/2023 13:48   US  Venous Img Upper Uni Right Result Date: 11/24/2023 CLINICAL DATA:  Pain EXAM: Right UPPER EXTREMITY VENOUS DOPPLER ULTRASOUND TECHNIQUE: Gray-scale sonography with graded compression, as well as color Doppler and duplex ultrasound were performed to evaluate the upper extremity deep venous system from the level of the subclavian vein and including the jugular, axillary, basilic, radial, ulnar and upper cephalic vein. Spectral Doppler was utilized to evaluate flow at rest and with distal augmentation  maneuvers. COMPARISON:  Ultrasound 11/24/2023 FINDINGS: Contralateral Subclavian Vein: Respiratory phasicity is normal and symmetric with the symptomatic side. No evidence of thrombus. Normal compressibility. Internal Jugular Vein: Positive  for occlusive thrombus. Noncompressible. Subclavian Vein: Positive for occlusive thrombus in the proximal subclavian vein. Noncompressible. Distal subclavian vein shows no thrombus. Axillary Vein: No evidence of thrombus. Normal compressibility, respiratory phasicity and response to augmentation. Cephalic Vein: No evidence of thrombus. Normal compressibility, respiratory phasicity and response to augmentation. Basilic Vein: No evidence of thrombus. Normal compressibility, respiratory phasicity and response to augmentation. Brachial Veins: No evidence of thrombus. Normal compressibility, respiratory phasicity and response to augmentation. Radial Veins: No evidence of thrombus.  Normal compressibility, Ulnar Veins: No evidence of thrombus.  Normal compressibility, IMPRESSION: Positive for acute occlusive DVT in the right internal jugular and proximal subclavian veins. Electronically Signed   By: Luke Bun M.D.   On: 11/24/2023 16:54   US  Soft Tissue Head/Neck Result Date: 11/24/2023 CLINICAL DATA:  Pain in and around throat and port EXAM: ULTRASOUND OF HEAD/NECK SOFT TISSUES TECHNIQUE: Ultrasound examination of the head and neck soft tissues was performed in the area of clinical concern. COMPARISON:  PET CT 11/14/2023, neck CT 06/13/2023 FINDINGS: Targeted sonography of the right neck is performed in the region of pain and swelling. Appearance of acute occlusive thrombus within the right jugular vein. Thrombus may extend to the proximal right subclavian vein. Within the right supraclavicular region, superficial to the vessels is a heterogenous hypoechoic solid-appearing area, this measures 2.9 x 1.8 x 2.2 cm. IMPRESSION: 1. Appearance of acute occlusive thrombus/DVT within  the right jugular vein. Thrombus may extend to the proximal right subclavian vein. Recommend dedicated upper extremity venous ultrasound to more completely evaluate extent of thrombus. 2. 2.9 cm heterogenous hypoechoic solid-appearing area within the right neck, superficial to the vessels, possibly an abnormal lymph node but no correlate on PET CT from several weeks prior, although hypermetabolic right thyroid  nodule was noted. As stated on the prior PET CT, dedicated thyroid  ultrasound evaluation would be recommended These results will be called to the ordering clinician or representative by the Radiologist Assistant, and communication documented in the PACS or [employer]. Electronically Signed   By: Luke Bun M.D.   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 EXAM: NUCLEAR MEDICINE PET SKULL BASE TO THIGH TECHNIQUE: 11.61 mCi F-18 FDG was injected intravenously. Full-ring PET imaging was performed from the skull base to thigh after the radiotracer. CT data was obtained and used for attenuation correction and anatomic localization. Fasting blood glucose: 107 mg/dl COMPARISON:  PET-CT 94/80/7974.  Abdominal MRI 08/22/2023. FINDINGS: Mediastinal blood pool activity: SUV max 2.4 Liver activity: SUV max 3.8 NECK: No hypermetabolic cervical lymph nodes are identified.Fairly symmetric activity within the lymphoid tissue of Waldeyer's ring is within physiologic limits. No suspicious activity identified within the pharyngeal mucosal space. Incidental CT findings: Hypermetabolic right thyroid  nodule measuring 2.1 cm on image 38/6 is again noted with an SUV max of 5.4 (previously 6.6). Bilateral carotid atherosclerosis. CHEST: There are no hypermetabolic mediastinal, hilar or axillary lymph nodes. No hypermetabolic pulmonary activity or suspicious nodularity. Incidental CT findings: Right  IJ Port-A-Cath extends into the inferior aspect of the right atrium. Three-vessel coronary artery atherosclerosis again noted. ABDOMEN/PELVIS: Interval marked improvement in the size and hypermetabolic activity within the previously demonstrated large mass involving the anterior aspect of the liver. Residual mass measures approximately 5.5 x 4.9 cm on image 70/6 and demonstrates a small amount of residual peripheral hypermetabolic activity (SUV max 5.6). Previously, this mass measured  up to 8.7 x 7.5 cm and had an SUV max of 13.6. There is mild gallbladder wall thickening medially with low level linear metabolic activity (SUV max 5.7), improved from previous study and likely residual tumor. No hypermetabolic activity within the spleen, adrenal glands or pancreas. There is no hypermetabolic nodal activity in the abdomen or pelvis. A previously demonstrated hypermetabolic retrocaval lymph node currently measures 9 mm on image 94/6 and has an SUV max of 3.0 (previously 1.3 cm, SUV max 10.4). Incidental CT findings: Biliary stent remains in place with pneumobilia and low level activity along the stent. Chronic atrophy of the left hepatic lobe. Mild aortoiliac atherosclerosis. Moderate enlargement of the prostate gland. SKELETON: There is no hypermetabolic activity to suggest osseous metastatic disease. Incidental CT findings: none IMPRESSION: 1. Interval marked improvement in the size and hypermetabolic activity within the previously demonstrated large mass involving the anterior aspect of the liver. Residual mass demonstrates a small amount of peripheral hypermetabolic activity ([place] 4). 2. Interval resolution of hypermetabolic retrocaval lymph node. 3. No evidence of new or progressive disease. 4. Persistent hypermetabolic right thyroid  nodule. This is unlikely to be related to the patient's lymphoma. However, hypermetabolic thyroid  nodules on PET have up to 40-50% incidence of malignancy; recommend further  evaluation with thyroid  ultrasound and possible US -guided fine needle aspiration. 5.  Aortic Atherosclerosis (ICD10-I70.0). Electronically Signed   By: Elsie Perone M.D.   On: 11/14/2023 16:38    Labs Recent Results (from the past 2160 hours)  CBC with Differential (Cancer Center Only)     Status: Abnormal   Collection Time: 09/20/23  3:10 PM  Result Value Ref Range   WBC Count 13.6 (H) 4.0 - 10.5 K/uL   RBC 4.44 4.22 - 5.81 MIL/uL   Hemoglobin 13.6 13.0 - 17.0 g/dL   HCT 61.3 (L) 60.9 - 47.9 %   MCV 86.9 80.0 - 100.0 fL   MCH 30.6 26.0 - 34.0 pg   MCHC 35.2 30.0 - 36.0 g/dL   RDW 85.5 88.4 - 84.4 %   Platelet Count 296 150 - 400 K/uL   nRBC 0.0 0.0 - 0.2 %   Neutrophils Relative % 76 %   Neutro Abs 10.5 (H) 1.7 - 7.7 K/uL   Lymphocytes Relative 11 %   Lymphs Abs 1.5 0.7 - 4.0 K/uL   Monocytes Relative 9 %   Monocytes Absolute 1.2 (H) 0.1 - 1.0 K/uL   Eosinophils Relative 2 %   Eosinophils Absolute 0.2 0.0 - 0.5 K/uL   Basophils Relative 1 %   Basophils Absolute 0.1 0.0 - 0.1 K/uL   Immature Granulocytes 1 %   Abs Immature Granulocytes 0.12 (H) 0.00 - 0.07 K/uL    Comment: Performed at [facility], [address] [place], [place] [address]  Hepatitis B core antibody, total     Status: None   Collection Time: 09/20/23  3:10 PM  Result Value Ref Range   HEP B CORE AB Negative Negative    Comment: (NOTE) Performed At: [facility] [address] [place], [place] [id number] Jennette Shorter MD Ey:[id number]   HIV Antibody (routine testing w rflx)     Status: None   Collection Time: 09/20/23  3:10 PM  Result Value Ref Range   HIV Screen 4th Generation wRfx Non Reactive Non Reactive    Comment: Performed at [facility], [address] [place], [place] [address]  Uric acid     Status: None   Collection Time: 09/20/23  3:10 PM  Result Value Ref Range   Uric Acid, Serum 6.1 3.7 - 8.6 mg/dL    Comment: HEMOLYSIS AT THIS LEVEL MAY AFFECT  RESULT Performed at [facility], [address] [place], [place] [address]   Phosphorus     Status: None   Collection Time: 09/20/23  3:10 PM  Result Value Ref Range   Phosphorus 2.7 2.5 - 4.6 mg/dL    Comment: Performed at [facility], [address] [place], [place] [address]  CMP (Cancer Center only)     Status: Abnormal   Collection Time: 09/20/23  3:11 PM  Result Value Ref Range   Sodium 133 (L) 135 - 145 mmol/L   Potassium 3.0 (L) 3.5 - 5.1 mmol/L   Chloride 98 98 - 111 mmol/L   CO2 24 22 - 32 mmol/L   Glucose, Bld 131 (H) 70 - 99 mg/dL    Comment: Glucose reference range applies only to samples taken after fasting for at least 8 hours.   BUN 20 8 - 23 mg/dL   Creatinine 9.00 9.38 - 1.24 mg/dL   Calcium 9.0 8.9 - 89.6 mg/dL   Total Protein 6.9 6.5 - 8.1 g/dL   Albumin 3.5 3.5 - 5.0 g/dL   AST 52 (H) 15 - 41 U/L   ALT 59 (H) 0 - 44 U/L   Alkaline Phosphatase 193 (H) 38 - 126 U/L   Total Bilirubin 2.3 (H) 0.0 - 1.2 mg/dL   GFR, Estimated >39 >39 mL/min    Comment: (NOTE) Calculated using the CKD-EPI Creatinine Equation (2021)    Anion gap 11 5 - 15    Comment: Performed at [facility], [address] [place], [place] [address]  Hepatitis B surface antigen     Status: None   Collection Time: 09/20/23  3:11 PM  Result Value Ref Range   Hepatitis B Surface Ag NON REACTIVE NON REACTIVE    Comment: Performed at [facility], [address], [place], [place] [address]  Glucose, capillary     Status: None   Collection Time: 09/25/23  8:55 AM  Result Value Ref Range   Glucose-Capillary 92 70 - 99 mg/dL    Comment: Glucose reference range applies only to samples taken after fasting for at least 8 hours.  CMP (Cancer Center only)     Status: Abnormal   Collection Time: 09/29/23  8:04 AM  Result Value Ref Range   Sodium 132 (L) 135 - 145 mmol/L   Potassium 3.1 (L) 3.5 - 5.1 mmol/L   Chloride 97 (L) 98 - 111 mmol/L   CO2 23 22 - 32 mmol/L    Glucose, Bld 126 (H) 70 - 99 mg/dL    Comment: Glucose reference range applies only to samples taken after fasting for at least 8 hours.   BUN 20 8 - 23 mg/dL   Creatinine 9.17 9.38 - 1.24 mg/dL   Calcium 8.6 (L) 8.9 - 10.3 mg/dL   Total Protein 6.5 6.5 - 8.1 g/dL   Albumin 3.1 (L) 3.5 - 5.0 g/dL   AST 774 (HH) 15 - 41 U/L    Comment: CRITICAL RESULT CALLED TO, READ BACK BY AND VERIFIED WITH dr.rao at 0849 on 5.23.25 by isley,b   ALT 200 (H) 0 - 44 U/L   Alkaline Phosphatase 391 (H) 38 - 126 U/L   Total Bilirubin 3.6 (HH) 0.0 - 1.2 mg/dL    Comment: CRITICAL RESULT CALLED TO, READ BACK BY AND  VERIFIED WITH dr. melanee at [phone] on 5.23.25 by isley,b   GFR, Estimated >60 >60 mL/min    Comment: (NOTE) Calculated using the CKD-EPI Creatinine Equation (2021)    Anion gap 12 5 - 15    Comment: Performed at [facility], [address] [place], [place] [address]  CBC with Differential (Cancer Center Only)     Status: Abnormal   Collection Time: 09/29/23  8:04 AM  Result Value Ref Range   WBC Count 24.6 (H) 4.0 - 10.5 K/uL   RBC 4.45 4.22 - 5.81 MIL/uL   Hemoglobin 13.6 13.0 - 17.0 g/dL   HCT 61.3 (L) 60.9 - 47.9 %   MCV 86.7 80.0 - 100.0 fL   MCH 30.6 26.0 - 34.0 pg   MCHC 35.2 30.0 - 36.0 g/dL   RDW 86.0 88.4 - 84.4 %   Platelet Count 446 (H) 150 - 400 K/uL   nRBC 0.0 0.0 - 0.2 %   Neutrophils Relative % 92 %   Neutro Abs 22.6 (H) 1.7 - 7.7 K/uL   Lymphocytes Relative 4 %   Lymphs Abs 1.0 0.7 - 4.0 K/uL   Monocytes Relative 3 %   Monocytes Absolute 0.7 0.1 - 1.0 K/uL   Eosinophils Relative 1 %   Eosinophils Absolute 0.2 0.0 - 0.5 K/uL   Basophils Relative 0 %   Basophils Absolute 0.0 0.0 - 0.1 K/uL   RBC Morphology MORPHOLOGY UNREMARKABLE    Smear Review Normal platelet morphology     Comment: PLATELETS APPEAR ADEQUATE   Abs Immature Granulocytes 0.00 0.00 - 0.07 K/uL   Reactive, Benign Lymphocytes PRESENT     Comment: Performed at [facility], [address] [place], [place] [address]  Lactate dehydrogenase     Status: Abnormal   Collection Time: 09/29/23  8:04 AM  Result Value Ref Range   LDH 251 (H) 98 - 192 U/L    Comment: Performed at [facility], [address] [place], [place] [address]  CMP (Cancer Center only)     Status: Abnormal   Collection Time: 10/04/23  8:50 AM  Result Value Ref Range   Sodium 131 (L) 135 - 145 mmol/L   Potassium 3.2 (L) 3.5 - 5.1 mmol/L   Chloride 97 (L) 98 - 111 mmol/L   CO2 24 22 - 32 mmol/L   Glucose, Bld 130 (H) 70 - 99 mg/dL    Comment: Glucose reference range applies only to samples taken after fasting for at least 8 hours.   BUN 25 (H) 8 - 23 mg/dL   Creatinine 9.06 9.38 - 1.24 mg/dL   Calcium 8.8 (L) 8.9 - 10.3 mg/dL   Total Protein 6.5 6.5 - 8.1 g/dL   Albumin 3.4 (L) 3.5 - 5.0 g/dL   AST 49 (H) 15 - 41 U/L   ALT 116 (H) 0 - 44 U/L   Alkaline Phosphatase 248 (H) 38 - 126 U/L   Total Bilirubin 1.7 (H) 0.0 - 1.2 mg/dL   GFR, Estimated >39 >39 mL/min    Comment: (NOTE) Calculated using the CKD-EPI Creatinine Equation (2021)    Anion gap 10 5 - 15    Comment: Performed at [facility], [address] [place], [place] [address]  CBC with Differential (Cancer Center Only)     Status: Abnormal   Collection Time: 10/04/23  8:50 AM  Result Value Ref Range   WBC Count 19.0 (H) 4.0 - 10.5 K/uL   RBC 5.05 4.22 -  5.81 MIL/uL   Hemoglobin 15.3 13.0 - 17.0 g/dL   HCT 55.9 60.9 - 47.9 %   MCV 87.1 80.0 - 100.0 fL   MCH 30.3 26.0 - 34.0 pg   MCHC 34.8 30.0 - 36.0 g/dL   RDW 85.7 88.4 - 84.4 %   Platelet Count 489 (H) 150 - 400 K/uL   nRBC 0.0 0.0 - 0.2 %   Neutrophils Relative % 85 %   Neutro Abs 16.2 (H) 1.7 - 7.7 K/uL   Lymphocytes Relative 8 %   Lymphs Abs 1.4 0.7 - 4.0 K/uL   Monocytes Relative 5 %   Monocytes Absolute 1.0 0.1 - 1.0 K/uL   Eosinophils Relative 0 %   Eosinophils Absolute 0.0 0.0 - 0.5 K/uL   Basophils Relative 0 %   Basophils Absolute 0.1 0.0 - 0.1 K/uL    Immature Granulocytes 2 %   Abs Immature Granulocytes 0.32 (H) 0.00 - 0.07 K/uL    Comment: Performed at [facility], [address] [place], [place] [address]  CBC with Differential (Cancer Center Only)     Status: Abnormal   Collection Time: 10/09/23  9:18 AM  Result Value Ref Range   WBC Count 24.1 (H) 4.0 - 10.5 K/uL   RBC 4.54 4.22 - 5.81 MIL/uL   Hemoglobin 13.8 13.0 - 17.0 g/dL   HCT 59.4 60.9 - 47.9 %   MCV 89.2 80.0 - 100.0 fL   MCH 30.4 26.0 - 34.0 pg   MCHC 34.1 30.0 - 36.0 g/dL   RDW 85.7 88.4 - 84.4 %   Platelet Count 227 150 - 400 K/uL   nRBC 0.0 0.0 - 0.2 %   Neutrophils Relative % 94 %   Neutro Abs 22.7 (H) 1.7 - 7.7 K/uL   Lymphocytes Relative 5 %   Lymphs Abs 1.2 0.7 - 4.0 K/uL   Monocytes Relative 1 %   Monocytes Absolute 0.2 0.1 - 1.0 K/uL   Eosinophils Relative 0 %   Eosinophils Absolute 0.0 0.0 - 0.5 K/uL   Basophils Relative 0 %   Basophils Absolute 0.0 0.0 - 0.1 K/uL   WBC Morphology DOHLE BODIES     Comment: HYPERSEGMENTED NEUT   RBC Morphology MORPHOLOGY UNREMARKABLE    Smear Review Normal platelet morphology     Comment: PLATELETS APPEAR ADEQUATE   Abs Immature Granulocytes 0.00 0.00 - 0.07 K/uL    Comment: Performed at [facility], [address] [place], [place] [address]  CMP (Cancer Center only)     Status: Abnormal   Collection Time: 10/09/23  9:18 AM  Result Value Ref Range   Sodium 132 (L) 135 - 145 mmol/L   Potassium 3.6 3.5 - 5.1 mmol/L   Chloride 100 98 - 111 mmol/L   CO2 22 22 - 32 mmol/L   Glucose, Bld 106 (H) 70 - 99 mg/dL    Comment: Glucose reference range applies only to samples taken after fasting for at least 8 hours.   BUN 21 8 - 23 mg/dL   Creatinine 9.26 9.38 - 1.24 mg/dL   Calcium 8.3 (L) 8.9 - 10.3 mg/dL   Total Protein 6.3 (L) 6.5 - 8.1 g/dL   Albumin 3.2 (L) 3.5 - 5.0 g/dL   AST 48 (H) 15 - 41 U/L   ALT 64 (H) 0 - 44 U/L   Alkaline Phosphatase 265 (H) 38 - 126 U/L   Total Bilirubin 1.3 (H) 0.0 -  1.2 mg/dL   GFR, Estimated >39 >  60 mL/min    Comment: (NOTE) Calculated using the CKD-EPI Creatinine Equation (2021)    Anion gap 10 5 - 15    Comment: Performed at [facility], [address] [place], [place] [address]  Urinalysis, w/ Reflex to Culture (Infection Suspected) -Urine, Clean Catch     Status: Abnormal   Collection Time: 10/12/23  4:14 PM  Result Value Ref Range   Specimen Source URINE, CLEAN CATCH    Color, Urine YELLOW (A) YELLOW   APPearance CLEAR (A) CLEAR   Specific Gravity, Urine 1.009 1.005 - 1.030   pH 7.0 5.0 - 8.0   Glucose, UA NEGATIVE NEGATIVE mg/dL   Hgb urine dipstick NEGATIVE NEGATIVE   Bilirubin Urine NEGATIVE NEGATIVE   Ketones, ur NEGATIVE NEGATIVE mg/dL   Protein, ur NEGATIVE NEGATIVE mg/dL   Nitrite NEGATIVE NEGATIVE   Leukocytes,Ua NEGATIVE NEGATIVE   RBC / HPF 0-5 0 - 5 RBC/hpf   WBC, UA 0-5 0 - 5 WBC/hpf    Comment:        Reflex urine culture not performed if WBC <=10, OR if Squamous epithelial cells >5. If Squamous epithelial cells >5 suggest recollection.    Bacteria, UA NONE SEEN NONE SEEN   Squamous Epithelial / HPF 0 0 - 5 /HPF    Comment: Performed at [facility], [address] [place], [place] [address]  Comprehensive metabolic panel     Status: Abnormal   Collection Time: 10/12/23  4:47 PM  Result Value Ref Range   Sodium 133 (L) 135 - 145 mmol/L   Potassium 3.4 (L) 3.5 - 5.1 mmol/L   Chloride 101 98 - 111 mmol/L   CO2 21 (L) 22 - 32 mmol/L   Glucose, Bld 154 (H) 70 - 99 mg/dL    Comment: Glucose reference range applies only to samples taken after fasting for at least 8 hours.   BUN 13 8 - 23 mg/dL   Creatinine, Ser 9.14 0.61 - 1.24 mg/dL   Calcium 9.6 8.9 - 89.6 mg/dL   Total Protein 6.5 6.5 - 8.1 g/dL   Albumin 3.8 3.5 - 5.0 g/dL   AST 48 (H) 15 - 41 U/L   ALT 52 (H) 0 - 44 U/L   Alkaline Phosphatase 232 (H) 38 - 126 U/L   Total Bilirubin 1.1 0.0 - 1.2 mg/dL   GFR, Estimated >39 >39 mL/min     Comment: (NOTE) Calculated using the CKD-EPI Creatinine Equation (2021)    Anion gap 11 5 - 15    Comment: Performed at [facility], [address] [place], [place] [address]  CBC     Status: None   Collection Time: 10/12/23  4:47 PM  Result Value Ref Range   WBC 6.0 4.0 - 10.5 K/uL   RBC 4.43 4.22 - 5.81 MIL/uL   Hemoglobin 13.5 13.0 - 17.0 g/dL   HCT 60.4 60.9 - 47.9 %   MCV 89.2 80.0 - 100.0 fL   MCH 30.5 26.0 - 34.0 pg   MCHC 34.2 30.0 - 36.0 g/dL   RDW 86.2 88.4 - 84.4 %   Platelets 225 150 - 400 K/uL   nRBC 0.0 0.0 - 0.2 %    Comment: Performed at [facility], [address] [place], [place] [address]  Troponin I (High Sensitivity)     Status: None   Collection Time: 10/12/23  4:47 PM  Result Value Ref Range   Troponin I (High Sensitivity) 15 <18 ng/L  Comment: (NOTE) Elevated high sensitivity troponin I (hsTnI) values and significant  changes across serial measurements may suggest ACS but many other  chronic and acute conditions are known to elevate hsTnI results.  Refer to the Links section for chest pain algorithms and additional  guidance. Performed at [facility], [address] [place], [place] [address]   Resp panel by RT-PCR (RSV, Flu A&B, Covid) Anterior Nasal Swab     Status: None   Collection Time: 10/12/23  6:29 PM   Specimen: Anterior Nasal Swab  Result Value Ref Range   SARS Coronavirus 2 by RT PCR NEGATIVE NEGATIVE    Comment: (NOTE) SARS-CoV-2 target nucleic acids are NOT DETECTED.  The SARS-CoV-2 RNA is generally detectable in upper respiratory specimens during the acute phase of infection. The lowest concentration of SARS-CoV-2 viral copies this assay can detect is 138 copies/mL. A negative result does not preclude SARS-Cov-2 infection and should not be used as the sole basis for treatment or other patient management decisions. A negative result may occur with  improper specimen collection/handling,  submission of specimen other than nasopharyngeal swab, presence of viral mutation(s) within the areas targeted by this assay, and inadequate number of viral copies(<138 copies/mL). A negative result must be combined with clinical observations, patient history, and epidemiological information. The expected result is Negative.  Fact Sheet for Patients:  BloggerCourse.com  Fact Sheet for Healthcare Providers:  SeriousBroker.it  This test is no t yet approved or cleared by the [place]  FDA and  has been authorized for detection and/or diagnosis of SARS-CoV-2 by FDA under an Emergency Use Authorization (EUA). This EUA will remain  in effect (meaning this test can be used) for the duration of the COVID-19 declaration under Section 564(b)(1) of the Act, 21 U.S.C.section 360bbb-3(b)(1), unless the authorization is terminated  or revoked sooner.       Influenza A by PCR NEGATIVE NEGATIVE   Influenza B by PCR NEGATIVE NEGATIVE    Comment: (NOTE) The Xpert Xpress SARS-CoV-2/FLU/RSV plus assay is intended as an aid in the diagnosis of influenza from Nasopharyngeal swab specimens and should not be used as a sole basis for treatment. Nasal washings and aspirates are unacceptable for Xpert Xpress SARS-CoV-2/FLU/RSV testing.  Fact Sheet for Patients: BloggerCourse.com  Fact Sheet for Healthcare Providers: SeriousBroker.it  This test is not yet approved or cleared by the [place]  FDA and has been authorized for detection and/or diagnosis of SARS-CoV-2 by FDA under an Emergency Use Authorization (EUA). This EUA will remain in effect (meaning this test can be used) for the duration of the COVID-19 declaration under Section 564(b)(1) of the Act, 21 U.S.C. section 360bbb-3(b)(1), unless the authorization is terminated or revoked.     Resp Syncytial Virus by PCR NEGATIVE NEGATIVE     Comment: (NOTE) Fact Sheet for Patients: BloggerCourse.com  Fact Sheet for Healthcare Providers: SeriousBroker.it  This test is not yet approved or cleared by the [place]  FDA and has been authorized for detection and/or diagnosis of SARS-CoV-2 by FDA under an Emergency Use Authorization (EUA). This EUA will remain in effect (meaning this test can be used) for the duration of the COVID-19 declaration under Section 564(b)(1) of the Act, 21 U.S.C. section 360bbb-3(b)(1), unless the authorization is terminated or revoked.  Performed at [facility], [address] [place], [place] [address]   D-dimer, quantitative     Status: None   Collection Time: 10/12/23  7:34 PM  Result Value Ref Range   D-Dimer,  Quant 0.50 0.00 - 0.50 ug/mL-FEU    Comment: (NOTE) At the manufacturer cut-off value of 0.5 g/mL FEU, this assay has a negative predictive value of 95-100%.This assay is intended for use in conjunction with a clinical pretest probability (PTP) assessment model to exclude pulmonary embolism (PE) and deep venous thrombosis (DVT) in outpatients suspected of PE or DVT. Results should be correlated with clinical presentation. Performed at [facility], [address] [place], [place] [address]   CBC with Differential (Cancer Center Only)     Status: Abnormal   Collection Time: 10/16/23  9:51 AM  Result Value Ref Range   WBC Count 25.6 (H) 4.0 - 10.5 K/uL   RBC 4.73 4.22 - 5.81 MIL/uL   Hemoglobin 14.5 13.0 - 17.0 g/dL   HCT 57.2 60.9 - 47.9 %   MCV 90.3 80.0 - 100.0 fL   MCH 30.7 26.0 - 34.0 pg   MCHC 34.0 30.0 - 36.0 g/dL   RDW 85.3 88.4 - 84.4 %   Platelet Count 237 150 - 400 K/uL   nRBC 0.1 0.0 - 0.2 %   Neutrophils Relative % 73 %   Neutro Abs 18.9 (H) 1.7 - 7.7 K/uL   Lymphocytes Relative 6 %   Lymphs Abs 1.6 0.7 - 4.0 K/uL   Monocytes Relative 6 %   Monocytes Absolute 1.6 (H) 0.1 - 1.0 K/uL    Eosinophils Relative 1 %   Eosinophils Absolute 0.1 0.0 - 0.5 K/uL   Basophils Relative 1 %   Basophils Absolute 0.1 0.0 - 0.1 K/uL   WBC Morphology Moderate Left Shift (>5% metas and myelos)    RBC Morphology UNREMARKABLE    Smear Review Normal platelet morphology     Comment: PLATELETS APPEAR ADEQUATE   Immature Granulocytes 13 %    Comment: Increased IG's, likely caused by Bone Marrow Colony Stimulating Factor received within 30 days.   Abs Immature Granulocytes 3.25 (H) 0.00 - 0.07 K/uL    Comment: Performed at [facility], [address] [place], [place] [address]  CMP (Cancer Center only)     Status: Abnormal   Collection Time: 10/16/23  9:51 AM  Result Value Ref Range   Sodium 134 (L) 135 - 145 mmol/L   Potassium 3.8 3.5 - 5.1 mmol/L   Chloride 101 98 - 111 mmol/L   CO2 25 22 - 32 mmol/L   Glucose, Bld 124 (H) 70 - 99 mg/dL    Comment: Glucose reference range applies only to samples taken after fasting for at least 8 hours.   BUN 13 8 - 23 mg/dL   Creatinine 9.09 9.38 - 1.24 mg/dL   Calcium 9.0 8.9 - 89.6 mg/dL   Total Protein 7.2 6.5 - 8.1 g/dL   Albumin 3.9 3.5 - 5.0 g/dL   AST 32 15 - 41 U/L   ALT 35 0 - 44 U/L   Alkaline Phosphatase 218 (H) 38 - 126 U/L   Total Bilirubin 0.7 0.0 - 1.2 mg/dL   GFR, Estimated >39 >39 mL/min    Comment: (NOTE) Calculated using the CKD-EPI Creatinine Equation (2021)    Anion gap 8 5 - 15    Comment: Performed at [facility], [address] [place], [place] [address]  CBC with Differential (Cancer Center Only)     Status: Abnormal   Collection Time: 10/25/23  8:31 AM  Result Value Ref Range   WBC Count 19.2 (H) 4.0 - 10.5 K/uL   RBC 4.61 4.22 -  5.81 MIL/uL   Hemoglobin 14.2 13.0 - 17.0 g/dL   HCT 59.2 60.9 - 47.9 %   MCV 88.3 80.0 - 100.0 fL   MCH 30.8 26.0 - 34.0 pg   MCHC 34.9 30.0 - 36.0 g/dL   RDW 84.9 88.4 - 84.4 %   Platelet Count 456 (H) 150 - 400 K/uL   nRBC 0.0 0.0 - 0.2 %   Neutrophils Relative % 78 %    Neutro Abs 14.8 (H) 1.7 - 7.7 K/uL   Lymphocytes Relative 11 %   Lymphs Abs 2.2 0.7 - 4.0 K/uL   Monocytes Relative 9 %   Monocytes Absolute 1.6 (H) 0.1 - 1.0 K/uL   Eosinophils Relative 0 %   Eosinophils Absolute 0.1 0.0 - 0.5 K/uL   Basophils Relative 1 %   Basophils Absolute 0.2 (H) 0.0 - 0.1 K/uL   Immature Granulocytes 1 %   Abs Immature Granulocytes 0.27 (H) 0.00 - 0.07 K/uL    Comment: Performed at [facility], [address] [place], [place] [address]  CMP (Cancer Center only)     Status: Abnormal   Collection Time: 10/25/23  8:31 AM  Result Value Ref Range   Sodium 137 135 - 145 mmol/L   Potassium 3.7 3.5 - 5.1 mmol/L   Chloride 101 98 - 111 mmol/L   CO2 26 22 - 32 mmol/L   Glucose, Bld 104 (H) 70 - 99 mg/dL    Comment: Glucose reference range applies only to samples taken after fasting for at least 8 hours.   BUN 18 8 - 23 mg/dL   Creatinine 9.00 9.38 - 1.24 mg/dL   Calcium 9.3 8.9 - 89.6 mg/dL   Total Protein 6.9 6.5 - 8.1 g/dL   Albumin 3.8 3.5 - 5.0 g/dL   AST 25 15 - 41 U/L   ALT 24 0 - 44 U/L   Alkaline Phosphatase 125 38 - 126 U/L   Total Bilirubin 1.0 0.0 - 1.2 mg/dL   GFR, Estimated >39 >39 mL/min    Comment: (NOTE) Calculated using the CKD-EPI Creatinine Equation (2021)    Anion gap 10 5 - 15    Comment: Performed at [facility], [address] [place], [place] [address]  Glucose, capillary     Status: Abnormal   Collection Time: 11/14/23  8:05 AM  Result Value Ref Range   Glucose-Capillary 107 (H) 70 - 99 mg/dL    Comment: Glucose reference range applies only to samples taken after fasting for at least 8 hours.  CMP (Cancer Center only)     Status: Abnormal   Collection Time: 11/15/23  8:35 AM  Result Value Ref Range   Sodium 134 (L) 135 - 145 mmol/L   Potassium 3.6 3.5 - 5.1 mmol/L   Chloride 102 98 - 111 mmol/L   CO2 22 22 - 32 mmol/L   Glucose, Bld 217 (H) 70 - 99 mg/dL    Comment: Glucose reference range applies only to  samples taken after fasting for at least 8 hours.   BUN 18 8 - 23 mg/dL   Creatinine 8.89 9.38 - 1.24 mg/dL   Calcium 9.5 8.9 - 89.6 mg/dL   Total Protein 6.8 6.5 - 8.1 g/dL   Albumin 3.8 3.5 - 5.0 g/dL   AST 23 15 - 41 U/L   ALT 23 0 - 44 U/L   Alkaline Phosphatase 92 38 - 126 U/L   Total Bilirubin 0.7 0.0 - 1.2 mg/dL   GFR,  Estimated >60 >60 mL/min    Comment: (NOTE) Calculated using the CKD-EPI Creatinine Equation (2021)    Anion gap 10 5 - 15    Comment: Performed at [facility], [address] [place], [place] [address]  CBC with Differential (Cancer Center Only)     Status: Abnormal   Collection Time: 11/15/23  8:35 AM  Result Value Ref Range   WBC Count 29.9 (H) 4.0 - 10.5 K/uL   RBC 4.34 4.22 - 5.81 MIL/uL   Hemoglobin 13.6 13.0 - 17.0 g/dL   HCT 60.8 60.9 - 47.9 %   MCV 90.1 80.0 - 100.0 fL   MCH 31.3 26.0 - 34.0 pg   MCHC 34.8 30.0 - 36.0 g/dL   RDW 83.8 (H) 88.4 - 84.4 %   Platelet Count 337 150 - 400 K/uL   nRBC 0.1 0.0 - 0.2 %   Neutrophils Relative % 93 %   Neutro Abs 27.8 (H) 1.7 - 7.7 K/uL   Lymphocytes Relative 3 %   Lymphs Abs 0.9 0.7 - 4.0 K/uL   Monocytes Relative 3 %   Monocytes Absolute 0.7 0.1 - 1.0 K/uL   Eosinophils Relative 0 %   Eosinophils Absolute 0.0 0.0 - 0.5 K/uL   Basophils Relative 0 %   Basophils Absolute 0.1 0.0 - 0.1 K/uL   Immature Granulocytes 1 %   Abs Immature Granulocytes 0.39 (H) 0.00 - 0.07 K/uL    Comment: Performed at [facility], [address] [place], [place] [address]  Comprehensive metabolic panel with GFR     Status: Abnormal   Collection Time: 11/23/23 10:26 AM  Result Value Ref Range   Sodium 132 (L) 135 - 145 mmol/L   Potassium 3.6 3.5 - 5.1 mmol/L   Chloride 97 (L) 98 - 111 mmol/L   CO2 25 22 - 32 mmol/L   Glucose, Bld 115 (H) 70 - 99 mg/dL    Comment: Glucose reference range applies only to samples taken after fasting for at least 8 hours.   BUN 16 8 - 23 mg/dL   Creatinine, Ser 9.21 0.61 -  1.24 mg/dL   Calcium 9.1 8.9 - 89.6 mg/dL   Total Protein 6.3 (L) 6.5 - 8.1 g/dL   Albumin 3.8 3.5 - 5.0 g/dL   AST 22 15 - 41 U/L   ALT 25 0 - 44 U/L   Alkaline Phosphatase 87 38 - 126 U/L   Total Bilirubin 0.6 0.0 - 1.2 mg/dL   GFR, Estimated >39 >39 mL/min    Comment: (NOTE) Calculated using the CKD-EPI Creatinine Equation (2021)    Anion gap 10 5 - 15    Comment: Performed at [facility], [address] [place], [place] [address]  CBC with Differential/Platelet     Status: Abnormal   Collection Time: 11/23/23 10:26 AM  Result Value Ref Range   WBC 3.5 (L) 4.0 - 10.5 K/uL   RBC 3.78 (L) 4.22 - 5.81 MIL/uL   Hemoglobin 11.5 (L) 13.0 - 17.0 g/dL   HCT 65.3 (L) 60.9 - 47.9 %   MCV 91.5 80.0 - 100.0 fL   MCH 30.4 26.0 - 34.0 pg   MCHC 33.2 30.0 - 36.0 g/dL   RDW 83.6 (H) 88.4 - 84.4 %   Platelets 219 150 - 400 K/uL   nRBC 0.0 0.0 - 0.2 %   Neutrophils Relative % 59 %   Neutro Abs 2.1 1.7 - 7.7 K/uL   Lymphocytes Relative 20 %  Lymphs Abs 0.7 0.7 - 4.0 K/uL   Monocytes Relative 12 %   Monocytes Absolute 0.4 0.1 - 1.0 K/uL   Eosinophils Relative 3 %   Eosinophils Absolute 0.1 0.0 - 0.5 K/uL   Basophils Relative 3 %   Basophils Absolute 0.1 0.0 - 0.1 K/uL   Immature Granulocytes 3 %   Abs Immature Granulocytes 0.09 (H) 0.00 - 0.07 K/uL    Comment: Performed at [facility], 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  He has had a good appropriate response to anticoagulation thus far.  I discussed that as long as he is using his port, we can try to treat with just anticoagulation continue to use the port.  If if symptoms become debilitating, the port can be removed and consideration for thrombectomy at that time can be given.  I would consider getting his port out once it is no longer being used for chemotherapy  and would likely combine that with a venogram if need be.  As long as symptoms remain minimal, anticoagulation alone will be planned at this point.  I will see him back in a few months.  Essential (primary) hypertension blood pressure control important in reducing the progression of atherosclerotic disease. On appropriate oral medications.   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  Has several more treatments planned.      Selinda Gu 12/05/2023, 3:53 PM   This note was created with Dragon medical transcription system.  Any errors from dictation are unintentional.

## 2023-12-05 NOTE — Assessment & Plan Note (Signed)
 The patient has a Port-A-Cath related jugular and subclavian vein thrombosis on the right side.  He has had a good appropriate response to anticoagulation thus far.  I discussed that as long as he is using his port, we can try to treat with just anticoagulation continue to use the port.  If if symptoms become debilitating, the port can be removed and consideration for thrombectomy at that time can be given.  I would consider getting his port out once it is no longer being used for chemotherapy and would likely combine that with a venogram if need be.  As long as symptoms remain minimal, anticoagulation alone will be planned at this point.  I will see him back in a few months.

## 2023-12-05 NOTE — Assessment & Plan Note (Signed)
 Undergoing chemotherapy through the right sided Port-A-Cath as above.  Has several more treatments planned.

## 2023-12-06 ENCOUNTER — Inpatient Hospital Stay: Payer: Self-pay | Admitting: Internal Medicine

## 2023-12-06 ENCOUNTER — Inpatient Hospital Stay: Payer: Self-pay

## 2023-12-06 ENCOUNTER — Other Ambulatory Visit: Payer: Self-pay

## 2023-12-06 ENCOUNTER — Encounter: Payer: Self-pay | Admitting: Oncology

## 2023-12-06 ENCOUNTER — Encounter: Payer: Self-pay | Admitting: Hospice and Palliative Medicine

## 2023-12-06 ENCOUNTER — Encounter: Payer: Self-pay | Admitting: Internal Medicine

## 2023-12-06 VITALS — BP 123/76 | HR 99 | Temp 96.5°F | Resp 18

## 2023-12-06 VITALS — BP 118/83 | HR 100 | Temp 96.0°F | Resp 19 | Ht 66.0 in | Wt 213.2 lb

## 2023-12-06 DIAGNOSIS — C83398 Diffuse large b-cell lymphoma of other extranodal and solid organ sites: Secondary | ICD-10-CM

## 2023-12-06 LAB — CMP (CANCER CENTER ONLY)
ALT: 29 U/L (ref 0–44)
AST: 26 U/L (ref 15–41)
Albumin: 3.9 g/dL (ref 3.5–5.0)
Alkaline Phosphatase: 94 U/L (ref 38–126)
Anion gap: 10 (ref 5–15)
BUN: 21 mg/dL (ref 8–23)
CO2: 25 mmol/L (ref 22–32)
Calcium: 9.5 mg/dL (ref 8.9–10.3)
Chloride: 99 mmol/L (ref 98–111)
Creatinine: 0.81 mg/dL (ref 0.61–1.24)
GFR, Estimated: >60 mL/min (ref >60)
Glucose, Bld: 120 mg/dL — ABNORMAL HIGH (ref 70–99)
Potassium: 3.4 mmol/L — ABNORMAL LOW (ref 3.5–5.1)
Sodium: 134 mmol/L — ABNORMAL LOW (ref 135–145)
Total Bilirubin: 0.6 mg/dL (ref 0.0–1.2)
Total Protein: 6.8 g/dL (ref 6.5–8.1)

## 2023-12-06 LAB — CBC WITH DIFFERENTIAL (CANCER CENTER ONLY)
Abs Immature Granulocytes: 0.2 K/uL — ABNORMAL HIGH (ref 0.00–0.07)
Basophils Absolute: 0.1 K/uL (ref 0.0–0.1)
Basophils Relative: 0 %
Eosinophils Absolute: 0 K/uL (ref 0.0–0.5)
Eosinophils Relative: 0 %
HCT: 37 % — ABNORMAL LOW (ref 39.0–52.0)
Hemoglobin: 13 g/dL (ref 13.0–17.0)
Immature Granulocytes: 1 %
Lymphocytes Relative: 8 %
Lymphs Abs: 1.9 K/uL (ref 0.7–4.0)
MCH: 31.8 pg (ref 26.0–34.0)
MCHC: 35.1 g/dL (ref 30.0–36.0)
MCV: 90.5 fL (ref 80.0–100.0)
Monocytes Absolute: 1.6 K/uL — ABNORMAL HIGH (ref 0.1–1.0)
Monocytes Relative: 7 %
Neutro Abs: 21.1 K/uL — ABNORMAL HIGH (ref 1.7–7.7)
Neutrophils Relative %: 84 %
Platelet Count: 331 K/uL (ref 150–400)
RBC: 4.09 MIL/uL — ABNORMAL LOW (ref 4.22–5.81)
RDW: 16.8 % — ABNORMAL HIGH (ref 11.5–15.5)
WBC Count: 24.9 K/uL — ABNORMAL HIGH (ref 4.0–10.5)
nRBC: 0 % (ref 0.0–0.2)

## 2023-12-06 MED ORDER — DIPHENHYDRAMINE HCL 25 MG PO CAPS
50.0000 mg | ORAL_CAPSULE | Freq: Once | ORAL | Status: AC
Start: 2023-12-06 — End: 2023-12-06
  Administered 2023-12-06: 50 mg via ORAL
  Filled 2023-12-06: qty 2

## 2023-12-06 MED ORDER — HEPARIN SOD (PORK) LOCK FLUSH 100 UNIT/ML IV SOLN
500.0000 [IU] | Freq: Once | INTRAVENOUS | Status: AC | PRN
Start: 2023-12-06 — End: 2023-12-06
  Administered 2023-12-06: 500 [IU]
  Filled 2023-12-06: qty 5

## 2023-12-06 MED ORDER — DOXORUBICIN HCL CHEMO IV INJECTION 2 MG/ML
50.0000 mg/m2 | Freq: Once | INTRAVENOUS | Status: AC
  Administered 2023-12-06: 108 mg via INTRAVENOUS
  Filled 2023-12-06: qty 54

## 2023-12-06 MED ORDER — FOSAPREPITANT DIMEGLUMINE INJECTION 150 MG
150.0000 mg | Freq: Once | INTRAVENOUS | Status: AC
  Administered 2023-12-06: 150 mg via INTRAVENOUS
  Filled 2023-12-06: qty 150

## 2023-12-06 MED ORDER — PALONOSETRON HCL INJECTION 0.25 MG/5ML
0.2500 mg | Freq: Once | INTRAVENOUS | Status: AC
  Administered 2023-12-06: 0.25 mg via INTRAVENOUS
  Filled 2023-12-06: qty 5

## 2023-12-06 MED ORDER — ACETAMINOPHEN 325 MG PO TABS
650.0000 mg | ORAL_TABLET | Freq: Once | ORAL | Status: AC
  Administered 2023-12-06: 650 mg via ORAL
  Filled 2023-12-06: qty 2

## 2023-12-06 MED ORDER — VINCRISTINE SULFATE CHEMO INJECTION 1 MG/ML
2.0000 mg | Freq: Once | INTRAVENOUS | Status: AC
  Administered 2023-12-06: 2 mg via INTRAVENOUS
  Filled 2023-12-06: qty 2

## 2023-12-06 MED ORDER — SODIUM CHLORIDE 0.9 % IV SOLN
750.0000 mg/m2 | Freq: Once | INTRAVENOUS | Status: AC
  Administered 2023-12-06: 1500 mg via INTRAVENOUS
  Filled 2023-12-06: qty 75

## 2023-12-06 MED ORDER — DEXAMETHASONE SODIUM PHOSPHATE 10 MG/ML IJ SOLN
10.0000 mg | Freq: Once | INTRAMUSCULAR | Status: AC
  Administered 2023-12-06: 10 mg via INTRAVENOUS
  Filled 2023-12-06: qty 1

## 2023-12-06 MED ORDER — SODIUM CHLORIDE 0.9 % IV SOLN
375.0000 mg/m2 | Freq: Once | INTRAVENOUS | Status: AC
  Administered 2023-12-06: 800 mg via INTRAVENOUS
  Filled 2023-12-06: qty 50

## 2023-12-06 MED ORDER — SODIUM CHLORIDE 0.9 % IV SOLN
INTRAVENOUS | Status: DC
Start: 2023-12-06 — End: 2023-12-06
  Filled 2023-12-06: qty 250

## 2023-12-06 NOTE — Progress Notes (Signed)
  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  male pleasant patient with a history of nuclear B-cell lymphoma-currently on R-CHOP chemotherapy is here for follow-up.  In the interim patient was diagnosed with right jugular vein DVT.  Patient currently on Eliquis .  Denies any shortness of breath or cough. No fevers or chills.  No nausea or vomiting. No bleeding.    Review of Systems  Constitutional:  Positive for malaise/fatigue. Negative for chills, diaphoresis, fever and weight loss.  HENT:  Negative for nosebleeds and sore throat.   Eyes:  Negative for double vision.  Respiratory:  Negative for cough, hemoptysis, sputum production, shortness of breath and wheezing.   Cardiovascular:  Negative for chest pain, palpitations, orthopnea and leg swelling.  Gastrointestinal:  Negative for abdominal pain, blood in stool, constipation, diarrhea, heartburn, melena, nausea and vomiting.  Genitourinary:  Negative for dysuria, frequency and urgency.  Musculoskeletal:  Negative for back pain and joint pain.  Skin: Negative.  Negative for itching and rash.   Neurological:  Negative for dizziness, tingling, focal weakness, weakness and headaches.  Endo/Heme/Allergies:  Does not bruise/bleed easily.  Psychiatric/Behavioral:  Negative for depression. The patient is not nervous/anxious and does not have insomnia.     MEDICAL HISTORY:  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    GERD (gastroesophageal reflux disease)    Headache    Hyperlipidemia    Hypertension    Statin myopathy    Stroke [facility])    Thyroid  nodule     SURGICAL HISTORY: Past Surgical History:  Procedure Laterality Date   COLONOSCOPY N/A 08/18/2023   Procedure: COLONOSCOPY;  Surgeon: Maryruth Ole DASEN, MD;  Location: [facility] ENDOSCOPY;  Service: Endoscopy;  Laterality: N/A;   ERCP N/A 08/23/2023   Procedure: ERCP, WITH INTERVENTION IF INDICATED;  Surgeon: Jinny Carmine, MD;  Location: [facility] ENDOSCOPY;  Service: Endoscopy;  Laterality: N/A;   ERCP N/A 10/03/2023   Procedure: ERCP, WITH INTERVENTION IF INDICATED;  Surgeon: Jinny Carmine, MD;  Location: [facility] ENDOSCOPY;  Service: Endoscopy;  Laterality: N/A;   MOHS SURGERY     2025, left cheek   POLYPECTOMY  08/18/2023   Procedure: POLYPECTOMY, INTESTINE;  Surgeon: Maryruth Ole DASEN, MD;  Location: [facility] ENDOSCOPY;  Service: Endoscopy;;   TONSILLECTOMY     1968    SOCIAL HISTORY: Social History   Socioeconomic History   Marital status: Divorced    Spouse name: Not on file   Number of children: Not on file   Years of education: Not  on file   Highest education level: Not on file  Occupational History   Not on file  Tobacco Use   Smoking status: Former   Smokeless tobacco: Never  Vaping Use   Vaping status: Never Used  Substance and Sexual Activity   Alcohol use: Yes    Comment: occasional   Drug use: No   Sexual activity: Not on file  Other Topics  Concern   Not on file  Social History Narrative   Not on file   Social Drivers of Health   Financial Resource Strain: Low Risk  (07/05/2023)   Received [facility]   Overall Financial Resource Strain (CARDIA)    Difficulty of Paying Living Expenses: Not hard at all  Food Insecurity: No Food Insecurity (09/20/2023)   Hunger Vital Sign    Worried About Running Out of Food in the Last Year: Never true    Ran Out of Food in the Last Year: Never true  Transportation Needs: No Transportation Needs (07/05/2023)   Received from [facility]   PRAPARE - Transportation    In the past 12 months, has lack of transportation kept you from medical appointments or from getting medications?: No    Lack of Transportation (Non-Medical): No  Physical Activity: Not on file  Stress: Not on file  Social Connections: Not on file  Intimate Partner Violence: Not At Risk (09/20/2023)   Humiliation, Afraid, Rape, and Kick questionnaire    Fear of Current or Ex-Partner: No    Emotionally Abused: No    Physically Abused: No    Sexually Abused: No    FAMILY HISTORY: Family History  Problem Relation Age of Onset   Cancer Sister    Cancer Brother     ALLERGIES:  is allergic to hydrocodone-acetaminophen  and penicillins.  MEDICATIONS:  Current Outpatient Medications  Medication Sig Dispense Refill   acyclovir  (ZOVIRAX ) 400 MG tablet Take 1 tablet (400 mg total) by mouth daily. 30 tablet 3   allopurinol  (ZYLOPRIM ) 300 MG tablet Take 1 tablet (300 mg total) by mouth daily. 30 tablet 3   ALPRAZolam (XANAX) 1 MG tablet Take 0.5 mg by mouth daily as needed for anxiety.     amphetamine-dextroamphetamine (ADDERALL XR) 20 MG 24 hr capsule Take 20 mg by mouth daily.     APIXABAN  (ELIQUIS ) VTE STARTER PACK (10MG  AND 5MG ) Take as directed on package: start with two-5mg  tablets twice daily for 7 days. On day 8, switch to one-5mg  tablet twice daily. 74 each 0   atorvastatin  (LIPITOR) 40 MG tablet Take 40 mg by mouth daily. (Patient not taking: Reported on 12/05/2023)     buPROPion (WELLBUTRIN XL) 300 MG 24 hr tablet Take 300 mg by mouth daily.     cetirizine (ZYRTEC) 10 MG tablet Take 10 mg by mouth. (Patient not taking: Reported on 12/05/2023)     ezetimibe (ZETIA) 10 MG tablet Take 1 tablet by mouth daily.     lactulose  (CHRONULAC ) 10 GM/15ML solution TAKE 15 MLS (10 G TOTAL) BY MOUTH AS NEEDED FOR MODERATE CONSTIPATION. 1416 mL 1   levofloxacin  (LEVAQUIN ) 750 MG tablet Take 1 tablet (750 mg total) by mouth daily. 14 tablet 0   lidocaine -prilocaine  (EMLA ) cream Apply to affected area once 30 g 3   metoprolol succinate (TOPROL-XL) 25 MG 24 hr tablet Take 25 mg by mouth daily.     Na Sulfate-K Sulfate-Mg Sulfate concentrate (SUPREP) 17.5-3.13-1.6 GM/177ML SOLN Take 354 mLs by mouth as directed. (  Patient not taking: Reported on 11/23/2023)     naproxen  (NAPROSYN ) 500 MG tablet Take 1 tablet (500 mg total) by mouth 2 (two) times daily with a meal. 20 tablet 0   nystatin  (MYCOSTATIN ) 100000 UNIT/ML suspension Take 5 mLs (500,000 Units total) by mouth 4 (four) times daily. 60 mL 0   omeprazole (PRILOSEC) 40 MG capsule Take 40 mg by mouth daily.     ondansetron  (ZOFRAN  ODT) 4 MG disintegrating tablet Take 1 tablet (4 mg total) by mouth every 8 (eight) hours as needed for nausea or vomiting. 20 tablet 0   ondansetron  (ZOFRAN ) 8 MG tablet Take 1 tablet (8 mg total) by mouth every 8 (eight) hours as needed for nausea or vomiting. Start on the third day after cyclophosphamide  chemotherapy. 30 tablet 1   predniSONE  (DELTASONE ) 50 MG tablet Take 100 mg by mouth daily with breakfast. Take for 5 days coinciding with chemotherapy treatment     prochlorperazine  (COMPAZINE ) 10 MG tablet Take 1 tablet (10 mg total) by mouth every 6 (six) hours as needed for nausea or vomiting. 30 tablet 6   sildenafil (VIAGRA) 100 MG tablet Take by mouth.     telmisartan -hydrochlorothiazide  (MICARDIS  HCT)  80-25 MG tablet Take 1 tablet by mouth daily. 90 tablet 0   traZODone (DESYREL) 50 MG tablet Take 50 mg by mouth at bedtime.     No current facility-administered medications for this visit.   Facility-Administered Medications Ordered in Other Visits  Medication Dose Route Frequency Provider Last Rate Last Admin   0.9 %  sodium chloride  infusion   Intravenous Continuous Melanee Annah BROCKS, MD 10 mL/hr at 12/06/23 0945 New Bag at 12/06/23 0945   acetaminophen  (TYLENOL ) tablet 650 mg  650 mg Oral Once Rao, Archana C, MD       cyclophosphamide  (CYTOXAN ) 1,500 mg in sodium chloride  0.9 % 250 mL chemo infusion  750 mg/m2 (Treatment Plan Recorded) Intravenous Once Rao, Archana C, MD       dexamethasone  (DECADRON ) injection 10 mg  10 mg Intravenous Once Rao, Archana C, MD       diphenhydrAMINE  (BENADRYL ) capsule 50 mg  50 mg Oral Once Rao, Archana C, MD       DOXOrubicin  (ADRIAMYCIN ) chemo injection 108 mg  50 mg/m2 (Treatment Plan Recorded) Intravenous Once Rao, Archana C, MD       fosaprepitant  (EMEND) 150 mg in sodium chloride  0.9 % 145 mL IVPB  150 mg Intravenous Once Rao, Archana C, MD 450 mL/hr at 12/06/23 0946 150 mg at 12/06/23 0946   palonosetron  (ALOXI ) injection 0.25 mg  0.25 mg Intravenous Once Rao, Archana C, MD       riTUXimab  (RITUXAN ) 800 mg in sodium chloride  0.9 % 170 mL infusion  375 mg/m2 (Treatment Plan Recorded) Intravenous Once Melanee Annah BROCKS, MD       vinCRIStine  (ONCOVIN ) 2 mg in sodium chloride  0.9 % 50 mL chemo infusion  2 mg Intravenous Once Melanee Annah BROCKS, MD        PHYSICAL EXAMINATION:   Vitals:   12/06/23 0850  BP: 118/83  Pulse: 100  Resp: 19  Temp: (!) 96 F (35.6 C)  SpO2: 98%   Filed Weights   12/06/23 0850  Weight: 213 lb 3.2 oz (96.7 kg)    Physical Exam Vitals and nursing note reviewed.  HENT:     Head: Normocephalic and atraumatic.     Mouth/Throat:     Pharynx: Oropharynx is clear.  Eyes:  Extraocular Movements: Extraocular movements intact.      Pupils: Pupils are equal, round, and reactive to light.  Cardiovascular:     Rate and Rhythm: Normal rate and regular rhythm.  Pulmonary:     Comments: Decreased breath sounds bilaterally.  Abdominal:     Palpations: Abdomen is soft.  Musculoskeletal:        General: Normal range of motion.     Cervical back: Normal range of motion.  Skin:    General: Skin is warm.  Neurological:     General: No focal deficit present.     Mental Status: He is alert and oriented to person, place, and time.  Psychiatric:        Behavior: Behavior normal.        Judgment: Judgment normal.     LABORATORY DATA:  I have reviewed the data as listed Lab Results  Component Value Date   WBC 24.9 (H) 12/06/2023   HGB 13.0 12/06/2023   HCT 37.0 (L) 12/06/2023   MCV 90.5 12/06/2023   PLT 331 12/06/2023   Recent Labs    11/15/23 0835 11/23/23 1026 12/06/23 0838  NA 134* 132* 134*  K 3.6 3.6 3.4*  CL 102 97* 99  CO2 22 25 25   GLUCOSE 217* 115* 120*  BUN 18 16 21   CREATININE 1.10 0.78 0.81  CALCIUM 9.5 9.1 9.5  GFRNONAA >60 >60 >60  PROT 6.8 6.3* 6.8  ALBUMIN 3.8 3.8 3.9  AST 23 22 26   ALT 23 25 29   ALKPHOS 92 87 94  BILITOT 0.7 0.6 0.6    RADIOGRAPHIC STUDIES: I have personally reviewed the radiological images as listed and agreed with the findings in the report. US  THYROID  Result Date: 11/29/2023 CLINICAL DATA:  Incidental on PET. EXAM: THYROID  ULTRASOUND TECHNIQUE: Ultrasound examination of the thyroid  gland and adjacent soft tissues was performed. COMPARISON:  PET CT 11/14/2023 FINDINGS: Parenchymal Echotexture: Mildly heterogenous Isthmus: 0.5 cm Right lobe: 5.7 x 1.6 x 1.6 cm Left lobe: 3.9 x 1.2 x 1.1 cm _________________________________________________________ Estimated total number of nodules >/= 1 cm: 1 Number of spongiform nodules >/=  2 cm not described below (TR1): 0 Number of mixed cystic and solid nodules >/= 1.5 cm not described below (TR2): 0  _________________________________________________________ Nodule # 1: Hypoechoic solid nodule with internal dystrophic calcifications in the right lower gland measures 2.7 x 2.4 x 1.9 cm. This corresponds with the mildly hypermetabolic nodule seen on prior PET-CT. Hypermetabolism on PET supersedes the TI-RADS classification system. This nodule warrants biopsy. IMPRESSION: Positive for a 2.7 cm solid hypoechoic nodule in the right inferior gland corresponding with the hypermetabolic lesion seen on the prior PET-CT. Biopsy is recommended. Electronically Signed   By: Wilkie Lent M.D.   On: 11/29/2023 13:48   US  Venous Img Upper Uni Right Result Date: 11/24/2023 CLINICAL DATA:  Pain EXAM: Right UPPER EXTREMITY VENOUS DOPPLER ULTRASOUND TECHNIQUE: Gray-scale sonography with graded compression, as well as color Doppler and duplex ultrasound were performed to evaluate the upper extremity deep venous system from the level of the subclavian vein and including the jugular, axillary, basilic, radial, ulnar and upper cephalic vein. Spectral Doppler was utilized to evaluate flow at rest and with distal augmentation maneuvers. COMPARISON:  Ultrasound 11/24/2023 FINDINGS: Contralateral Subclavian Vein: Respiratory phasicity is normal and symmetric with the symptomatic side. No evidence of thrombus. Normal compressibility. Internal Jugular Vein: Positive for occlusive thrombus. Noncompressible. Subclavian Vein: Positive for occlusive thrombus in the proximal subclavian vein. Noncompressible. Distal subclavian  vein shows no thrombus. Axillary Vein: No evidence of thrombus. Normal compressibility, respiratory phasicity and response to augmentation. Cephalic Vein: No evidence of thrombus. Normal compressibility, respiratory phasicity and response to augmentation. Basilic Vein: No evidence of thrombus. Normal compressibility, respiratory phasicity and response to augmentation. Brachial Veins: No evidence of thrombus. Normal  compressibility, respiratory phasicity and response to augmentation. Radial Veins: No evidence of thrombus.  Normal compressibility, Ulnar Veins: No evidence of thrombus.  Normal compressibility, IMPRESSION: Positive for acute occlusive DVT in the right internal jugular and proximal subclavian veins. Electronically Signed   By: Luke Bun M.D.   On: 11/24/2023 16:54   US  Soft Tissue Head/Neck Result Date: 11/24/2023 CLINICAL DATA:  Pain in and around throat and port EXAM: ULTRASOUND OF HEAD/NECK SOFT TISSUES TECHNIQUE: Ultrasound examination of the head and neck soft tissues was performed in the area of clinical concern. COMPARISON:  PET CT 11/14/2023, neck CT 06/13/2023 FINDINGS: Targeted sonography of the right neck is performed in the region of pain and swelling. Appearance of acute occlusive thrombus within the right jugular vein. Thrombus may extend to the proximal right subclavian vein. Within the right supraclavicular region, superficial to the vessels is a heterogenous hypoechoic solid-appearing area, this measures 2.9 x 1.8 x 2.2 cm. IMPRESSION: 1. Appearance of acute occlusive thrombus/DVT within the right jugular vein. Thrombus may extend to the proximal right subclavian vein. Recommend dedicated upper extremity venous ultrasound to more completely evaluate extent of thrombus. 2. 2.9 cm heterogenous hypoechoic solid-appearing area within the right neck, superficial to the vessels, possibly an abnormal lymph node but no correlate on PET CT from several weeks prior, although hypermetabolic right thyroid  nodule was noted. As stated on the prior PET CT, dedicated thyroid  ultrasound evaluation would be recommended These results will be called to the ordering clinician or representative by the Radiologist Assistant, and communication documented in the PACS or [employer]. Electronically Signed   By: Luke Bun M.D.   On: 11/24/2023 15:27   NM PET Image Restag (PS) Skull Base To Thigh Result  Date: 11/14/2023 CLINICAL DATA:  Choose 4 treatment strategy for diffuse large B-cell lymphoma. EXAM: NUCLEAR MEDICINE PET SKULL BASE TO THIGH TECHNIQUE: 11.61 mCi F-18 FDG was injected intravenously. Full-ring PET imaging was performed from the skull base to thigh after the radiotracer. CT data was obtained and used for attenuation correction and anatomic localization. Fasting blood glucose: 107 mg/dl COMPARISON:  PET-CT 94/80/7974.  Abdominal MRI 08/22/2023. FINDINGS: Mediastinal blood pool activity: SUV max 2.4 Liver activity: SUV max 3.8 NECK: No hypermetabolic cervical lymph nodes are identified.Fairly symmetric activity within the lymphoid tissue of Waldeyer's ring is within physiologic limits. No suspicious activity identified within the pharyngeal mucosal space. Incidental CT findings: Hypermetabolic right thyroid  nodule measuring 2.1 cm on image 38/6 is again noted with an SUV max of 5.4 (previously 6.6). Bilateral carotid atherosclerosis. CHEST: There are no hypermetabolic mediastinal, hilar or axillary lymph nodes. No hypermetabolic pulmonary activity or suspicious nodularity. Incidental CT findings: Right IJ Port-A-Cath extends into the inferior aspect of the right atrium. Three-vessel coronary artery atherosclerosis again noted. ABDOMEN/PELVIS: Interval marked improvement in the size and hypermetabolic activity within the previously demonstrated large mass involving the anterior aspect of the liver. Residual mass measures approximately 5.5 x 4.9 cm on image 70/6 and demonstrates a small amount of residual peripheral hypermetabolic activity (SUV max 5.6). Previously, this mass measured up to 8.7 x 7.5 cm and had an SUV max of 13.6. There is mild gallbladder wall  thickening medially with low level linear metabolic activity (SUV max 5.7), improved from previous study and likely residual tumor. No hypermetabolic activity within the spleen, adrenal glands or pancreas. There is no hypermetabolic nodal activity  in the abdomen or pelvis. A previously demonstrated hypermetabolic retrocaval lymph node currently measures 9 mm on image 94/6 and has an SUV max of 3.0 (previously 1.3 cm, SUV max 10.4). Incidental CT findings: Biliary stent remains in place with pneumobilia and low level activity along the stent. Chronic atrophy of the left hepatic lobe. Mild aortoiliac atherosclerosis. Moderate enlargement of the prostate gland. SKELETON: There is no hypermetabolic activity to suggest osseous metastatic disease. Incidental CT findings: none IMPRESSION: 1. Interval marked improvement in the size and hypermetabolic activity within the previously demonstrated large mass involving the anterior aspect of the liver. Residual mass demonstrates a small amount of peripheral hypermetabolic activity (Deauville 4). 2. Interval resolution of hypermetabolic retrocaval lymph node. 3. No evidence of new or progressive disease. 4. Persistent hypermetabolic right thyroid  nodule. This is unlikely to be related to the patient's lymphoma. However, hypermetabolic thyroid  nodules on PET have up to 40-50% incidence of malignancy; recommend further evaluation with thyroid  ultrasound and possible US -guided fine needle aspiration. 5.  Aortic Atherosclerosis (ICD10-I70.0). Electronically Signed   By: Elsie Perone M.D.   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  # Proceed with cycle 4 of R-CHOP chemotherapy. Labs-CBC/chemistries were reviewed with the patient. Leucocytosis- sec to GCSF.    # November 24, 2023- positive for acute occlusive DVT in the right internal jugular and proximal subclavian veins.  Discussed with vascular [Dr. Dew]-currently on Eliquis   # Incidental - PET scan: persistent hypermetabolic right  thyroid  nodule. This is unlikely to be related to the patient's lymphoma. JULY 2025- US  thyroid - Positive for a 2.7 cm solid hypoechoic nodule in the right inferior gland corresponding with the hypermetabolic lesion seen on the prior PET-CT.  currently awaiting with endocrine-[facility] evaluation with thyroid  ultrasound and possible US -guided fine needle aspiration.   # Hx of obstructive- s/p stenting [Dr.Wohl]- Hold off exchange for now- given recent anti-coagulation for acute internal jugular DVT.   # ID prophylaxis: on acyclovir .   # DISPOSITION: # chemo today; injection on 8/1 # follow up as planned-with Dr.Rao/labs- chemo- Dr.B  Above plan of care was discussed with patient/family in detail.  My contact information was given to the patient/family.      Cindy JONELLE Joe, MD 12/06/2023 9:46 AM

## 2023-12-06 NOTE — Assessment & Plan Note (Addendum)
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  # Proceed with cycle 4 of R-CHOP chemotherapy. Labs-CBC/chemistries were reviewed with the patient. Leucocytosis- sec to GCSF.    # November 24, 2023- positive for acute occlusive DVT in the right internal jugular and proximal subclavian veins.  Discussed with vascular [Dr. Dew]-currently on Eliquis   # Incidental - PET scan: persistent hypermetabolic right thyroid  nodule. This is unlikely to be related to the patient's lymphoma. JULY 2025- US  thyroid - Positive for a 2.7 cm solid hypoechoic nodule in the right inferior gland corresponding with the hypermetabolic lesion seen on the prior PET-CT.  currently awaiting with endocrine-[facility] evaluation with thyroid  ultrasound and possible US -guided fine needle aspiration.   # Hx of obstructive- s/p stenting [Dr.Wohl]- Hold off exchange for now- given recent anti-coagulation for acute internal jugular DVT.   # ID prophylaxis: on acyclovir .   # DISPOSITION: # chemo today; injection on 8/1 # follow up as planned-with Dr.Rao/labs- chemo- Dr.B

## 2023-12-06 NOTE — Patient Instructions (Signed)
 [facility] - A DEPT OF [facility]  Discharge Instructions: Thank you for choosing [facility] to provide your oncology and hematology care.  If you have a lab appointment with the Cancer Center, please go directly to the Cancer Center and check in at the registration area.  Wear comfortable clothing and clothing appropriate for easy access to any Portacath or PICC line.   We strive to give you quality time with your provider. You may need to reschedule your appointment if you arrive late (15 or more minutes).  Arriving late affects you and other patients whose appointments are after yours.  Also, if you miss three or more appointments without notifying the office, you may be dismissed from the clinic at the provider's discretion.      For prescription refill requests, have your pharmacy contact our office and allow 72 hours for refills to be completed.    Today you received the following chemotherapy and/or immunotherapy agents doxorubicin , vincristine , cytoxan , and rituximab       To help prevent nausea and vomiting after your treatment, we encourage you to take your nausea medication as directed.  BELOW ARE SYMPTOMS THAT SHOULD BE REPORTED IMMEDIATELY: *FEVER GREATER THAN 100.4 F (38 C) OR HIGHER *CHILLS OR SWEATING *NAUSEA AND VOMITING THAT IS NOT CONTROLLED WITH YOUR NAUSEA MEDICATION *UNUSUAL SHORTNESS OF BREATH *UNUSUAL BRUISING OR BLEEDING *URINARY PROBLEMS (pain or burning when urinating, or frequent urination) *BOWEL PROBLEMS (unusual diarrhea, constipation, pain near the anus) TENDERNESS IN MOUTH AND THROAT WITH OR WITHOUT PRESENCE OF ULCERS (sore throat, sores in mouth, or a toothache) UNUSUAL RASH, SWELLING OR PAIN  UNUSUAL VAGINAL DISCHARGE OR ITCHING   Items with * indicate a potential emergency and should be followed up as soon as possible or go to the Emergency Department if any problems should occur.  Please show the  CHEMOTHERAPY ALERT CARD or IMMUNOTHERAPY ALERT CARD at check-in to the Emergency Department and triage nurse.  Should you have questions after your visit or need to cancel or reschedule your appointment, please contact [facility] - A DEPT OF [facility]  [phone] and follow the prompts.  Office hours are 8:00 a.m. to 4:30 p.m. Monday - Friday. Please note that voicemails left after 4:00 p.m. may not be returned until the following business day.  We are closed weekends and major holidays. You have access to a nurse at all times for urgent questions. Please call the main number to the clinic [phone] and follow the prompts.  For any non-urgent questions, you may also contact your provider using MyChart. We now offer e-Visits for anyone 21 and older to request care online for non-urgent symptoms. For details visit [email or web address].   Also download the MyChart app! Go to the app store, search MyChart, open the app, select [place], and log in with your MyChart username and password.

## 2023-12-06 NOTE — Progress Notes (Signed)
 Patient would like insight regarding his stents being removed. Other than that, no new or acute concerns.

## 2023-12-08 ENCOUNTER — Inpatient Hospital Stay: Payer: MEDICAID | Attending: Oncology

## 2023-12-08 VITALS — BP 145/89 | HR 97

## 2023-12-08 DIAGNOSIS — Z5112 Encounter for antineoplastic immunotherapy: Secondary | ICD-10-CM | POA: Insufficient documentation

## 2023-12-08 DIAGNOSIS — Z5111 Encounter for antineoplastic chemotherapy: Secondary | ICD-10-CM | POA: Insufficient documentation

## 2023-12-08 DIAGNOSIS — C83398 Diffuse large b-cell lymphoma of other extranodal and solid organ sites: Secondary | ICD-10-CM | POA: Insufficient documentation

## 2023-12-08 DIAGNOSIS — Z79620 Long term (current) use of immunosuppressive biologic: Secondary | ICD-10-CM | POA: Insufficient documentation

## 2023-12-08 DIAGNOSIS — Z5189 Encounter for other specified aftercare: Secondary | ICD-10-CM | POA: Insufficient documentation

## 2023-12-08 DIAGNOSIS — Z79633 Long term (current) use of mitotic inhibitor: Secondary | ICD-10-CM | POA: Insufficient documentation

## 2023-12-08 MED ORDER — PEGFILGRASTIM-FPGK 6 MG/0.6ML ~~LOC~~ SOSY
6.0000 mg | PREFILLED_SYRINGE | Freq: Once | SUBCUTANEOUS | Status: AC
Start: 2023-12-08 — End: 2023-12-08
  Administered 2023-12-08: 6 mg via SUBCUTANEOUS
  Filled 2023-12-08: qty 0.6

## 2023-12-12 NOTE — Telephone Encounter (Signed)
 Clearance faxed to Dr Marea x 2

## 2023-12-16 ENCOUNTER — Other Ambulatory Visit: Payer: Self-pay | Admitting: Oncology

## 2023-12-18 ENCOUNTER — Encounter: Payer: Self-pay | Admitting: Oncology

## 2023-12-18 ENCOUNTER — Encounter: Payer: Self-pay | Admitting: Hospice and Palliative Medicine

## 2023-12-20 ENCOUNTER — Other Ambulatory Visit: Payer: Self-pay

## 2023-12-20 DIAGNOSIS — K831 Obstruction of bile duct: Secondary | ICD-10-CM

## 2023-12-20 NOTE — Progress Notes (Unsigned)
 ERCP scheduled clearance received from Dr Marea, pt is to hold Eliquis  2 days prior and restart x 1 day after... Instructions released via Mychart

## 2023-12-25 ENCOUNTER — Encounter: Payer: Self-pay | Admitting: Oncology

## 2023-12-25 ENCOUNTER — Ambulatory Visit
Admission: RE | Admit: 2023-12-25 | Discharge: 2023-12-25 | Disposition: A | Discharge status: Home / Self Care | Payer: Self-pay | Attending: Gastroenterology | Admitting: Gastroenterology

## 2023-12-25 ENCOUNTER — Telehealth: Payer: Self-pay

## 2023-12-25 ENCOUNTER — Encounter: Payer: Self-pay | Admitting: Gastroenterology

## 2023-12-25 ENCOUNTER — Ambulatory Visit: Payer: Self-pay

## 2023-12-25 ENCOUNTER — Other Ambulatory Visit: Payer: Self-pay

## 2023-12-25 ENCOUNTER — Encounter
Admission: RE | Disposition: A | Discharge status: Home / Self Care | Payer: Self-pay | Source: Home / Self Care | Attending: Gastroenterology

## 2023-12-25 DIAGNOSIS — Z6833 Body mass index (BMI) 33.0-33.9, adult: Secondary | ICD-10-CM | POA: Insufficient documentation

## 2023-12-25 DIAGNOSIS — J449 Chronic obstructive pulmonary disease, unspecified: Secondary | ICD-10-CM | POA: Insufficient documentation

## 2023-12-25 DIAGNOSIS — E669 Obesity, unspecified: Secondary | ICD-10-CM | POA: Insufficient documentation

## 2023-12-25 DIAGNOSIS — Z87891 Personal history of nicotine dependence: Secondary | ICD-10-CM | POA: Insufficient documentation

## 2023-12-25 DIAGNOSIS — R519 Headache, unspecified: Secondary | ICD-10-CM | POA: Insufficient documentation

## 2023-12-25 DIAGNOSIS — K831 Obstruction of bile duct: Secondary | ICD-10-CM

## 2023-12-25 DIAGNOSIS — Z4659 Encounter for fitting and adjustment of other gastrointestinal appliance and device: Secondary | ICD-10-CM | POA: Insufficient documentation

## 2023-12-25 DIAGNOSIS — Z79899 Other long term (current) drug therapy: Secondary | ICD-10-CM | POA: Insufficient documentation

## 2023-12-25 DIAGNOSIS — K219 Gastro-esophageal reflux disease without esophagitis: Secondary | ICD-10-CM | POA: Insufficient documentation

## 2023-12-25 DIAGNOSIS — Z79624 Long term (current) use of inhibitors of nucleotide synthesis: Secondary | ICD-10-CM | POA: Insufficient documentation

## 2023-12-25 DIAGNOSIS — Z86718 Personal history of other venous thrombosis and embolism: Secondary | ICD-10-CM | POA: Insufficient documentation

## 2023-12-25 DIAGNOSIS — Z955 Presence of coronary angioplasty implant and graft: Secondary | ICD-10-CM | POA: Insufficient documentation

## 2023-12-25 DIAGNOSIS — Z8673 Personal history of transient ischemic attack (TIA), and cerebral infarction without residual deficits: Secondary | ICD-10-CM | POA: Insufficient documentation

## 2023-12-25 DIAGNOSIS — F909 Attention-deficit hyperactivity disorder, unspecified type: Secondary | ICD-10-CM | POA: Insufficient documentation

## 2023-12-25 DIAGNOSIS — I10 Essential (primary) hypertension: Secondary | ICD-10-CM | POA: Insufficient documentation

## 2023-12-25 HISTORY — PX: ERCP: SHX5425

## 2023-12-25 SURGERY — ERCP, WITH INTERVENTION IF INDICATED
Anesthesia: General

## 2023-12-25 MED ORDER — SODIUM CHLORIDE 0.9% FLUSH: 10.0000 mL | INTRAVENOUS | Status: DC | PRN

## 2023-12-25 MED ORDER — LACTATED RINGERS IV SOLN: Freq: Once | INTRAVENOUS | Status: AC

## 2023-12-25 MED ORDER — HEPARIN SOD (PORK) LOCK FLUSH 100 UNIT/ML IV SOLN
INTRAVENOUS | Status: AC
  Filled 2023-12-25: qty 5

## 2023-12-25 MED ORDER — DEXMEDETOMIDINE HCL IN NACL 80 MCG/20ML IV SOLN
INTRAVENOUS | Status: DC | PRN
Start: 2023-12-25 — End: 2023-12-25
  Administered 2023-12-25: 10 ug via INTRAVENOUS

## 2023-12-25 MED ORDER — PROPOFOL 500 MG/50ML IV EMUL
INTRAVENOUS | Status: DC | PRN
  Administered 2023-12-25: 100 ug/kg/min via INTRAVENOUS

## 2023-12-25 MED ORDER — SODIUM CHLORIDE 0.9 % IV SOLN: INTRAVENOUS | Status: DC

## 2023-12-25 MED ORDER — HEPARIN SOD (PORK) LOCK FLUSH 100 UNIT/ML IV SOLN: 250.0000 [IU] | INTRAVENOUS | Status: DC | PRN

## 2023-12-25 MED ORDER — HEPARIN SOD (PORK) LOCK FLUSH 100 UNIT/ML IV SOLN
250.0000 [IU] | Freq: Once | INTRAVENOUS | Status: AC
  Administered 2023-12-25: 250 [IU] via INTRAVENOUS

## 2023-12-25 MED ORDER — PROPOFOL 10 MG/ML IV BOLUS
INTRAVENOUS | Status: DC | PRN
  Administered 2023-12-25: 20 mg via INTRAVENOUS
  Administered 2023-12-25: 50 mg via INTRAVENOUS
  Administered 2023-12-25: 30 mg via INTRAVENOUS

## 2023-12-25 MED ORDER — LIDOCAINE HCL (CARDIAC) PF 100 MG/5ML IV SOSY
PREFILLED_SYRINGE | INTRAVENOUS | Status: DC | PRN
Start: 2023-12-25 — End: 2023-12-25
  Administered 2023-12-25: 50 mg via INTRAVENOUS

## 2023-12-25 NOTE — Anesthesia Preprocedure Evaluation (Addendum)
 Anesthesia Evaluation  Patient identified by MRN, date of birth, ID band Patient awake    Reviewed: Allergy & Precautions, H&P , NPO status , Patient's Chart, lab work & pertinent test results, reviewed documented beta blocker date and time   History of Anesthesia Complications Negative for: history of anesthetic complications  Airway Mallampati: IV  TM Distance: >3 FB Neck ROM: full    Dental  (+) Dental Advidsory Given, Caps   Pulmonary neg shortness of breath, COPD, neg recent URI, former smoker   Pulmonary exam normal breath sounds clear to auscultation       Cardiovascular Exercise Tolerance: Good hypertension, (-) angina + DVT  (-) Past MI and (-) Cardiac Stents Normal cardiovascular exam(-) dysrhythmias (-) Valvular Problems/Murmurs Rhythm:regular Rate:Normal  ECG 07/20/23: 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; RBBB, LAFB; bifascicular block; no STEMI  Echo 10/18/22:  Normal Stress Echocardiogram  NORMAL RIGHT VENTRICULAR SYSTOLIC FUNCTION  TRIVIAL REGURGITATION NOTED  NO VALVULAR STENOSIS NOTED     Neuro/Psych  Headaches, neg Seizures PSYCHIATRIC DISORDERS (ADHD)      CVA (Ct scan with evidence of old stroke)    GI/Hepatic ,GERD  Medicated and Controlled,,  Endo/Other  negative endocrine ROSneg diabetes    Renal/GU negative Renal ROS  negative genitourinary   Musculoskeletal   Abdominal  (+) + obese  Peds  Hematology negative hematology ROS (+)   Anesthesia Other Findings Jaundice   Past Medical History: No date: ADD (attention deficit disorder) No date: Carpal tunnel syndrome, bilateral No date: COPD (chronic obstructive pulmonary disease) (HCC) No date: 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 No date: GERD (gastroesophageal reflux disease) No date: Headache No date: Hyperlipidemia No date: Hypertension No date: Statin myopathy No date: Thyroid  nodule   Reproductive/Obstetrics negative OB ROS                               Anesthesia Physical Anesthesia Plan  ASA: 3  Anesthesia Plan: General   Post-op Pain Management: Minimal or no pain anticipated   Induction: Intravenous  PONV Risk Score and Plan: 2 and Propofol  infusion, TIVA and Treatment may vary due to age or medical condition  Airway Management Planned: Natural Airway and Nasal Cannula  Additional Equipment:   Intra-op Plan:   Post-operative Plan:   Informed Consent: I have reviewed the patients History and Physical, chart, labs and discussed the procedure including the risks, benefits and alternatives for the proposed anesthesia with the patient or authorized representative who has indicated his/her understanding and acceptance.     Dental Advisory Given  Plan Discussed with: Anesthesiologist, CRNA and Surgeon  Anesthesia Plan Comments:          Anesthesia Quick Evaluation

## 2023-12-25 NOTE — Transfer of Care (Signed)
 Immediate Anesthesia Transfer of Care Note  Patient: Joel [name]  Procedure(s) Performed: ERCP, WITH INTERVENTION IF INDICATED  Patient Location: PACU  Anesthesia Type:MAC  Level of Consciousness: drowsy  Airway & Oxygen Therapy: Patient Spontanous Breathing  Post-op Assessment: Report given to RN and Post -op Vital signs reviewed and stable  Post vital signs: stable  Last Vitals:  Vitals Value Taken Time  BP    Temp    Pulse    Resp    SpO2      Last Pain:  Vitals:   12/25/23 0913  TempSrc: Tympanic  PainSc: 0-No pain         Complications: No notable events documented.

## 2023-12-25 NOTE — H&P (Signed)
 Joel Copping, MD [facility] [address], [address] [place], [place] [address] Phone:[phone] Fax : [phone]  Primary Care Physician:  Auston Reyes BIRCH, MD Primary Gastroenterologist:  Dr. Copping  Pre-Procedure History & Physical: HPI:  Joel [name] is a 63 y.o. male is here for an ERCP.   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    GERD (gastroesophageal reflux disease)    Headache    Hyperlipidemia    Hypertension    Statin myopathy    Stroke [facility])    Thyroid  nodule     Past Surgical History:  Procedure Laterality Date   COLONOSCOPY N/A 08/18/2023   Procedure: COLONOSCOPY;  Surgeon: Maryruth Ole DASEN, MD;  Location: [facility] ENDOSCOPY;  Service: Endoscopy;  Laterality: N/A;   ERCP N/A 08/23/2023   Procedure: ERCP, WITH INTERVENTION IF INDICATED;  Surgeon: [name] Rogelia, MD;  Location: [facility] ENDOSCOPY;  Service: Endoscopy;  Laterality: N/A;   ERCP N/A 10/03/2023   Procedure: ERCP, WITH INTERVENTION IF INDICATED;  Surgeon: [name] Rogelia, MD;  Location: [facility] ENDOSCOPY;  Service: Endoscopy;  Laterality: N/A;   MOHS SURGERY     2025, left cheek   POLYPECTOMY  08/18/2023   Procedure: POLYPECTOMY, INTESTINE;  Surgeon: Maryruth Ole DASEN, MD;  Location: [facility] ENDOSCOPY;  Service: Endoscopy;;   TONSILLECTOMY     1968    Prior to Admission medications   Medication Sig Start Date End Date Taking? Authorizing Provider  acyclovir  (ZOVIRAX ) 400 MG tablet Take 1 tablet (400 mg total) by mouth daily. 09/20/23   Melanee Annah BROCKS, MD  allopurinol  (ZYLOPRIM ) 300 MG tablet TAKE 1 TABLET BY MOUTH EVERY DAY 12/18/23   Rao, Archana C, MD  ALPRAZolam (XANAX) 1 MG tablet Take 0.5 mg by mouth daily as needed for anxiety.    [provider]  amphetamine-dextroamphetamine (ADDERALL XR) 20 MG 24 hr capsule Take 20 mg by mouth  daily.    [provider]  APIXABAN  (ELIQUIS ) VTE STARTER PACK (10MG  AND 5MG ) Take as directed on package: start with two-5mg  tablets twice daily for 7 days. On day 8, switch to one-5mg  tablet twice daily. 11/24/23   Borders, Fonda SAUNDERS, NP  atorvastatin (LIPITOR) 40 MG tablet Take 40 mg by mouth daily. Patient not taking: Reported on 12/05/2023    [provider]  buPROPion (WELLBUTRIN XL) 300 MG 24 hr tablet Take 300 mg by mouth daily. 04/14/23   [provider]  cetirizine (ZYRTEC) 10 MG tablet Take 10 mg by mouth. Patient not taking: Reported on 12/05/2023    [provider]  ezetimibe (ZETIA) 10 MG tablet Take 1 tablet by mouth daily. 07/05/23 07/04/24  [provider]  lactulose  (CHRONULAC ) 10 GM/15ML solution TAKE 15 MLS (10 G TOTAL) BY MOUTH AS NEEDED FOR MODERATE CONSTIPATION. 10/30/23   Melanee Annah BROCKS, MD  levofloxacin  (LEVAQUIN ) 750 MG tablet Take 1 tablet (750 mg total) by mouth daily. 10/04/23   Melanee Annah BROCKS, MD  lidocaine -prilocaine  (EMLA ) cream Apply to affected area once 09/20/23   Melanee Annah BROCKS, MD  Na Sulfate-K Sulfate-Mg Sulfate concentrate (SUPREP) 17.5-3.13-1.6 GM/177ML SOLN Take 354 mLs by mouth as directed. Patient not taking: Reported on 11/23/2023 06/02/23   [provider]  naproxen  (NAPROSYN ) 500 MG tablet Take 1 tablet (500 mg total) by mouth 2 (two) times daily with  a meal. 05/31/17   Viviann Pastor, MD  nystatin  (MYCOSTATIN ) 100000 UNIT/ML suspension Take 5 mLs (500,000 Units total) by mouth 4 (four) times daily. 11/23/23   Borders, Fonda SAUNDERS, NP  omeprazole (PRILOSEC) 40 MG capsule Take 40 mg by mouth daily.    [provider]  ondansetron  (ZOFRAN  ODT) 4 MG disintegrating tablet Take 1 tablet (4 mg total) by mouth every 8 (eight) hours as needed for nausea or vomiting. 05/31/17   Viviann Pastor, MD  ondansetron  (ZOFRAN ) 8 MG tablet Take 1 tablet (8 mg total) by mouth every 8 (eight) hours as needed for nausea or  vomiting. Start on the third day after cyclophosphamide  chemotherapy. 09/20/23   Melanee Annah BROCKS, MD  predniSONE  (DELTASONE ) 50 MG tablet Take 100 mg by mouth daily with breakfast. Take for 5 days coinciding with chemotherapy treatment    [provider]  prochlorperazine  (COMPAZINE ) 10 MG tablet Take 1 tablet (10 mg total) by mouth every 6 (six) hours as needed for nausea or vomiting. 09/20/23   Melanee Annah BROCKS, MD  sildenafil (VIAGRA) 100 MG tablet Take by mouth. 08/05/23   [provider]  telmisartan -hydrochlorothiazide  (MICARDIS  HCT) 80-25 MG tablet Take 1 tablet by mouth daily. 05/31/17   Viviann Pastor, MD  traZODone (DESYREL) 50 MG tablet Take 50 mg by mouth at bedtime.    [provider]    Allergies as of 12/20/2023 - Review Complete 12/06/2023  Allergen Reaction Noted   Hydrocodone-acetaminophen  Other (See Comments) 12/02/2013   Penicillins  01/28/2017    Family History  Problem Relation Age of Onset   Cancer Sister    Cancer Brother     Social History   Socioeconomic History   Marital status: Divorced    Spouse name: Not on file   Number of children: Not on file   Years of education: Not on file   Highest education level: Not on file  Occupational History   Not on file  Tobacco Use   Smoking status: Former   Smokeless tobacco: Never  Vaping Use   Vaping status: Never Used  Substance and Sexual Activity   Alcohol use: Yes    Comment: occasional   Drug use: No   Sexual activity: Not on file  Other Topics Concern   Not on file  Social History Narrative   Not on file   Social Drivers of Health   Financial Resource Strain: Low Risk  (07/05/2023)   Received from [facility]   Overall Financial Resource Strain (CARDIA)    Difficulty of Paying Living Expenses: Not hard at all  Food Insecurity: No Food Insecurity (09/20/2023)   Hunger Vital Sign    Worried About Running Out of Food in the Last Year: Never true    Ran Out  of Food in the Last Year: Never true  Transportation Needs: No Transportation Needs (07/05/2023)   Received from [facility]   PRAPARE - Transportation    In the past 12 months, has lack of transportation kept you from medical appointments or from getting medications?: No    Lack of Transportation (Non-Medical): No  Physical Activity: Not on file  Stress: Not on file  Social Connections: Not on file  Intimate Partner Violence: Not At Risk (09/20/2023)   Humiliation, Afraid, Rape, and Kick questionnaire    Fear of Current or Ex-Partner: No    Emotionally Abused: No    Physically Abused: No    Sexually Abused: No  Review of Systems: See HPI, otherwise negative ROS  Physical Exam: BP 118/87   Pulse (!) 115   Temp (!) 97.4 F (36.3 C) (Tympanic)   Resp 16   Ht 5' 6 (1.676 m)   Wt 94.9 kg   SpO2 98%   BMI 33.77 kg/m  General:   Alert,  pleasant and cooperative in NAD Head:  Normocephalic and atraumatic. Neck:  Supple; no masses or thyromegaly. Lungs:  Clear throughout to auscultation.    Heart:  Regular rate and rhythm. Abdomen:  Soft, nontender and nondistended. Normal bowel sounds, without guarding, and without rebound.   Neurologic:  Alert and  oriented x4;  grossly normal neurologically.  Impression/Plan: Joel [name] is here for an ERCP to be performed for for stent change or removal  Risks, benefits, limitations, and alternatives regarding  ERCP have been reviewed with the patient.  Questions have been answered.  All parties agreeable.   Joel Copping, MD  12/25/2023, 9:24 AM

## 2023-12-25 NOTE — Anesthesia Postprocedure Evaluation (Signed)
 Anesthesia Post Note  Patient: Joel [name]  Procedure(s) Performed: ERCP, WITH INTERVENTION IF INDICATED  Patient location during evaluation: PACU Anesthesia Type: General Level of consciousness: awake and alert Pain management: pain level controlled Vital Signs Assessment: post-procedure vital signs reviewed and stable Respiratory status: spontaneous breathing, nonlabored ventilation, respiratory function stable and patient connected to nasal cannula oxygen Cardiovascular status: blood pressure returned to baseline and stable Postop Assessment: no apparent nausea or vomiting Anesthetic complications: no   No notable events documented.   Last Vitals:  Vitals:   12/25/23 1037 12/25/23 1046  BP: 109/75 112/80  Pulse:    Resp: 16 16  Temp:    SpO2: 97% 98%    Last Pain:  Vitals:   12/25/23 1046  TempSrc:   PainSc: 0-No pain                 Prentice Murphy

## 2023-12-25 NOTE — Op Note (Signed)
 [facility] Patient Name: Joel [name] Procedure Date: 12/25/2023 9:51 AM MRN: [id number] Account #: [id number] Date of Birth: Jan 12, 1961 Admit Type: Outpatient Age: 63 Room: [facility] ENDO ROOM 4 Gender: Male Note Status: Finalized Instrument Name: CELINDA [id number] Procedure:             ERCP Indications:           Stent change Providers:             Rogelia Copping MD, MD Referring MD:          Reyes BIRCH. Auston, MD (Referring MD) Medicines:             Propofol  per Anesthesia Complications:         No immediate complications. Procedure:             Pre-Anesthesia Assessment:                        - Prior to the procedure, a History and Physical was                         performed, and patient medications and allergies were                         reviewed. The patient's tolerance of previous                         anesthesia was also reviewed. The risks and benefits                         of the procedure and the sedation options and risks                         were discussed with the patient. All questions were                         answered, and informed consent was obtained. Prior                         Anticoagulants: The patient has taken no anticoagulant                         or antiplatelet agents. ASA Grade Assessment: II - A                         patient with mild systemic disease. After reviewing                         the risks and benefits, the patient was deemed in                         satisfactory condition to undergo the procedure.                        After obtaining informed consent, the scope was passed                         under direct vision. Throughout the procedure, the  patient's blood pressure, pulse, and oxygen                         saturations were monitored continuously. The                         Duodenoscope was introduced through the mouth, and                          used to inject contrast into and used to inject                         contrast into the bile duct. The ERCP was accomplished                         without difficulty. The patient tolerated the                         procedure well. Findings:      A scout film of the abdomen was obtained. Two stents ending in the main       bile duct were seen. Two stents originating in the common bile duct were       emerging from the major papilla. Two stents were removed from the common       bile duct using a snare. A wire was passed into the biliary tree. One 10       Fr by 9 cm plastic stent with a single external flap and a single       internal flap was placed 8 cm into the common bile duct. Bile flowed       through the stent. The stent was in good position. Impression:            - Two stents from the common bile duct were seen in                         the major papilla.                        - Two stents were removed from the common bile duct.                        - One plastic stent was placed into the common bile                         duct. Recommendation:        - Discharge patient to home.                        - Resume previous diet.                        - Watch for pancreatitis, bleeding, perforation, and                         cholangitis.                        - Repeat ERCP in 3 months to remove stent.                        -  Check LFT's in 1 week. Procedure Code(s):     --- Professional ---                        [phone], Endoscopic retrograde cholangiopancreatography                         (ERCP); with removal and exchange of stent(s), biliary                         or pancreatic duct, including pre- and post-dilation                         and guide wire passage, when performed, including                         sphincterotomy, when performed, each stent exchanged Diagnosis Code(s):     --- Professional ---                        S53.40, Encounter for fitting and  adjustment of other                         gastrointestinal appliance and device CPT copyright 2022 American [employer]. All rights reserved. The codes documented in this report are preliminary and upon coder review may  be revised to meet current compliance requirements. Rogelia Copping MD, MD 12/25/2023 10:20:54 AM This report has been signed electronically. Number of Addenda: 0 Note Initiated On: 12/25/2023 9:51 AM Estimated Blood Loss:  Estimated blood loss: none.      [facility]

## 2023-12-25 NOTE — Telephone Encounter (Signed)
 Hep function panel x 1 week

## 2023-12-26 MED FILL — Fosaprepitant Dimeglumine For IV Infusion 150 MG (Base Eq): INTRAVENOUS | Qty: 5 | Status: AC

## 2023-12-27 ENCOUNTER — Inpatient Hospital Stay (HOSPITAL_BASED_OUTPATIENT_CLINIC_OR_DEPARTMENT_OTHER): Payer: Self-pay | Admitting: Oncology

## 2023-12-27 ENCOUNTER — Inpatient Hospital Stay: Payer: Self-pay

## 2023-12-27 ENCOUNTER — Encounter: Payer: Self-pay | Admitting: Oncology

## 2023-12-27 VITALS — BP 96/62 | HR 100 | Temp 97.0°F | Resp 19 | Ht 66.0 in | Wt 213.2 lb

## 2023-12-27 VITALS — BP 116/84 | HR 96 | Temp 97.4°F | Resp 16

## 2023-12-27 DIAGNOSIS — C83398 Diffuse large b-cell lymphoma of other extranodal and solid organ sites: Secondary | ICD-10-CM

## 2023-12-27 DIAGNOSIS — Z79620 Long term (current) use of immunosuppressive biologic: Secondary | ICD-10-CM

## 2023-12-27 DIAGNOSIS — I82890 Acute embolism and thrombosis of other specified veins: Secondary | ICD-10-CM

## 2023-12-27 DIAGNOSIS — Z5181 Encounter for therapeutic drug level monitoring: Secondary | ICD-10-CM

## 2023-12-27 DIAGNOSIS — Z5111 Encounter for antineoplastic chemotherapy: Secondary | ICD-10-CM

## 2023-12-27 LAB — CBC WITH DIFFERENTIAL (CANCER CENTER ONLY)
Abs Immature Granulocytes: 0.31 K/uL — ABNORMAL HIGH (ref 0.00–0.07)
Basophils Absolute: 0.1 K/uL (ref 0.0–0.1)
Basophils Relative: 0 %
Eosinophils Absolute: 0 K/uL (ref 0.0–0.5)
Eosinophils Relative: 0 %
HCT: 37.9 % — ABNORMAL LOW (ref 39.0–52.0)
Hemoglobin: 13.1 g/dL (ref 13.0–17.0)
Immature Granulocytes: 1 %
Lymphocytes Relative: 5 %
Lymphs Abs: 1.2 K/uL (ref 0.7–4.0)
MCH: 32 pg (ref 26.0–34.0)
MCHC: 34.6 g/dL (ref 30.0–36.0)
MCV: 92.4 fL (ref 80.0–100.0)
Monocytes Absolute: 1 K/uL (ref 0.1–1.0)
Monocytes Relative: 4 %
Neutro Abs: 22 K/uL — ABNORMAL HIGH (ref 1.7–7.7)
Neutrophils Relative %: 90 %
Platelet Count: 312 K/uL (ref 150–400)
RBC: 4.1 MIL/uL — ABNORMAL LOW (ref 4.22–5.81)
RDW: 17.7 % — ABNORMAL HIGH (ref 11.5–15.5)
WBC Count: 24.6 K/uL — ABNORMAL HIGH (ref 4.0–10.5)
nRBC: 0 % (ref 0.0–0.2)

## 2023-12-27 LAB — CMP (CANCER CENTER ONLY)
ALT: 38 U/L (ref 0–44)
AST: 24 U/L (ref 15–41)
Albumin: 3.7 g/dL (ref 3.5–5.0)
Alkaline Phosphatase: 102 U/L (ref 38–126)
Anion gap: 10 (ref 5–15)
BUN: 21 mg/dL (ref 8–23)
CO2: 24 mmol/L (ref 22–32)
Calcium: 9.4 mg/dL (ref 8.9–10.3)
Chloride: 101 mmol/L (ref 98–111)
Creatinine: 1.01 mg/dL (ref 0.61–1.24)
GFR, Estimated: >60 mL/min (ref >60)
Glucose, Bld: 140 mg/dL — ABNORMAL HIGH (ref 70–99)
Potassium: 3.3 mmol/L — ABNORMAL LOW (ref 3.5–5.1)
Sodium: 135 mmol/L (ref 135–145)
Total Bilirubin: 0.7 mg/dL (ref 0.0–1.2)
Total Protein: 6.5 g/dL (ref 6.5–8.1)

## 2023-12-27 MED ORDER — DIPHENHYDRAMINE HCL 25 MG PO CAPS
50.0000 mg | ORAL_CAPSULE | Freq: Once | ORAL | Status: AC
  Administered 2023-12-27: 50 mg via ORAL
  Filled 2023-12-27: qty 2

## 2023-12-27 MED ORDER — DEXAMETHASONE SODIUM PHOSPHATE 10 MG/ML IJ SOLN
10.0000 mg | Freq: Once | INTRAMUSCULAR | Status: AC
  Administered 2023-12-27: 10 mg via INTRAVENOUS
  Filled 2023-12-27: qty 1

## 2023-12-27 MED ORDER — ACETAMINOPHEN 325 MG PO TABS
650.0000 mg | ORAL_TABLET | Freq: Once | ORAL | Status: DC
  Filled 2023-12-27: qty 2

## 2023-12-27 MED ORDER — SODIUM CHLORIDE 0.9 % IV SOLN
750.0000 mg/m2 | Freq: Once | INTRAVENOUS | Status: AC
  Administered 2023-12-27: 1500 mg via INTRAVENOUS
  Filled 2023-12-27: qty 75

## 2023-12-27 MED ORDER — SODIUM CHLORIDE 0.9 % IV SOLN
375.0000 mg/m2 | Freq: Once | INTRAVENOUS | Status: AC
  Administered 2023-12-27: 800 mg via INTRAVENOUS
  Filled 2023-12-27: qty 50

## 2023-12-27 MED ORDER — SODIUM CHLORIDE 0.9 % IV SOLN
150.0000 mg | Freq: Once | INTRAVENOUS | Status: AC
  Administered 2023-12-27: 150 mg via INTRAVENOUS
  Filled 2023-12-27: qty 150

## 2023-12-27 MED ORDER — VINCRISTINE SULFATE CHEMO INJECTION 1 MG/ML
2.0000 mg | Freq: Once | INTRAVENOUS | Status: AC
  Administered 2023-12-27: 2 mg via INTRAVENOUS
  Filled 2023-12-27: qty 2

## 2023-12-27 MED ORDER — PALONOSETRON HCL INJECTION 0.25 MG/5ML
0.2500 mg | Freq: Once | INTRAVENOUS | Status: AC
  Administered 2023-12-27: 0.25 mg via INTRAVENOUS
  Filled 2023-12-27: qty 5

## 2023-12-27 MED ORDER — DOXORUBICIN HCL CHEMO IV INJECTION 2 MG/ML
50.0000 mg/m2 | Freq: Once | INTRAVENOUS | Status: AC
  Administered 2023-12-27: 108 mg via INTRAVENOUS
  Filled 2023-12-27: qty 54

## 2023-12-27 MED ORDER — SODIUM CHLORIDE 0.9 % IV SOLN
INTRAVENOUS | Status: DC
  Filled 2023-12-27: qty 250

## 2023-12-27 NOTE — Patient Instructions (Signed)
 [facility] - A DEPT OF [facility]  Discharge Instructions: Thank you for choosing [facility] to provide your oncology and hematology care.  If you have a lab appointment with the Cancer Center, please go directly to the Cancer Center and check in at the registration area.  Wear comfortable clothing and clothing appropriate for easy access to any Portacath or PICC line.   We strive to give you quality time with your provider. You may need to reschedule your appointment if you arrive late (15 or more minutes).  Arriving late affects you and other patients whose appointments are after yours.  Also, if you miss three or more appointments without notifying the office, you may be dismissed from the clinic at the provider's discretion.      For prescription refill requests, have your pharmacy contact our office and allow 72 hours for refills to be completed.    Today you received the following chemotherapy and/or immunotherapy agents Ariamycin, Oncovin , Cytoxan  & Rituxan       To help prevent nausea and vomiting after your treatment, we encourage you to take your nausea medication as directed.  BELOW ARE SYMPTOMS THAT SHOULD BE REPORTED IMMEDIATELY: *FEVER GREATER THAN 100.4 F (38 C) OR HIGHER *CHILLS OR SWEATING *NAUSEA AND VOMITING THAT IS NOT CONTROLLED WITH YOUR NAUSEA MEDICATION *UNUSUAL SHORTNESS OF BREATH *UNUSUAL BRUISING OR BLEEDING *URINARY PROBLEMS (pain or burning when urinating, or frequent urination) *BOWEL PROBLEMS (unusual diarrhea, constipation, pain near the anus) TENDERNESS IN MOUTH AND THROAT WITH OR WITHOUT PRESENCE OF ULCERS (sore throat, sores in mouth, or a toothache) UNUSUAL RASH, SWELLING OR PAIN  UNUSUAL VAGINAL DISCHARGE OR ITCHING   Items with * indicate a potential emergency and should be followed up as soon as possible or go to the Emergency Department if any problems should occur.  Please show the CHEMOTHERAPY  ALERT CARD or IMMUNOTHERAPY ALERT CARD at check-in to the Emergency Department and triage nurse.  Should you have questions after your visit or need to cancel or reschedule your appointment, please contact [facility] - A DEPT OF [facility]  [phone] and follow the prompts.  Office hours are 8:00 a.m. to 4:30 p.m. Monday - Friday. Please note that voicemails left after 4:00 p.m. may not be returned until the following business day.  We are closed weekends and major holidays. You have access to a nurse at all times for urgent questions. Please call the main number to the clinic [phone] and follow the prompts.  For any non-urgent questions, you may also contact your provider using MyChart. We now offer e-Visits for anyone 56 and older to request care online for non-urgent symptoms. For details visit [email or web address].   Also download the MyChart app! Go to the app store, search MyChart, open the app, select [place], and log in with your MyChart username and password.

## 2023-12-27 NOTE — Patient Instructions (Signed)

## 2023-12-27 NOTE — Progress Notes (Signed)
 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  On the day of his colonoscopy patient noticed that he was jaundiced and therefore underwent labs which showed bilirubin of 14 on his CMP AST ALT of 70 and 108 and alkaline phosphatase of 246 respectively.  This was followed by MRI abdomen with MRCP with and without contrast which showed a large complex partially necrotic mass involving the central aspect of the liver.  Mass is involving segment 4A, segment 8 and the caudate lobe.  Also surrounds the gallbladder and is obstructing the biliary tree centrally near the confluence of the right and left hepatic ducts.  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  Compression and possible obstruction of the left portal vein.  The main portal vein is patent and the middle and right portal veins are patent.  There is tumor adjacent to and compressing the intrahepatic IVC but no evidence of occlusion.  Gastrohepatic ligament and celiac axis and periportal nodes concerning for metastatic disease.  Patient underwent ERCP by Dr. Edith on 08/23/2023 which showed a segmental biliary stricture that was malignant appearing and a plastic stent was placed.  Cells for cytology were obtained which was consistent with atypical glandular cells but not diagnostic for malignancy.    Patient was seen by Dr. Romero from pancreaticobiliary surgery at [facility] and was not deemed to be a surgical candidate.  He was then seen by Dr. Donato from medical oncology who discussed the possibility that this could be locally advanced cholangiocarcinoma and discussed possible treatment options for the same.  However since patient did not have definitive tissue diagnosis liver biopsy was recommended.  He had a port placed in the meanwhile.  Liver biopsy surprisingly did not show any evidence of cholangiocarcinoma but was consistent with diffuse large B-cell lymphoma.   Diffuse large B cell lymphoma (DLBCL), germinal center B-cell like (GCB) subtype. See comment.   Comment: Histologic sections show needle core shaped tissue with diffuse infiltrate of large atypical lymphoid cells.  No normal liver parenchyma is identified.  The immunophenotype of the large atypical lymphoid cells are consistent with DLBCL GCB subtype per Mikle algorithm.  Staining for MYC and EBER was negative.83% of nuclei showed rearrangement of the MYC (8q24) locus by interphase FISH; 44-54% of nuclei showed additional, intact copies of the BCL6 locus (3q27) and BCL2 loci. No evidence of BCL2 rearrangement, BCL6 rearrangement, or IGH::MYC rearrangement was observed. MYC rearrangement in the absence of BCL2 or BCL6 gene rearrangements, is a nonspecific finding and may be seen in Burkitt lymphoma (BL) or diffuse large B-cell lymphoma, not otherwise specified (DLBCL, NOS).       Summary Table  Locus Result  MYC (8q24) 83% with MYC rearrangement  BCL2 (18q21) 54% with 3 intact copies No evidence of BCL2 rearrangement   BCL6 (3q27) 44% with 3 intact copies No evidence of BCL6 rearrangement       PET CT scan showed  multiple hypermetabolic mass in the liver and tracer avid abdominal lymph nodes compatible with lymphoma.  There was a thyroid  nodule 2.1 cm with an SUV of 6.6 and thyroid  ultrasound was recommended for the same.  Baseline  echocardiogram normal.  Given abnormal LFTs and GCB subtype plan is to proceed with R-CHOP chemotherapy instead of R Pola CHP chemotherapy.  IPI score 2-3 low- high intemediate risk (age greater than 60 and stage IV disease.  Extranodal sites-?2-given liver/contiguous gallbladder/ bile duct involvement)    Interval history-tolerating chemotherapy well so far.  Metal stent was taken out 4 days ago and plastic biliary stent was replaced.  Today patient reports feeling well and has not had any fevers chills   ECOG PS- 0 Pain scale- 0   Review of systems- Review of Systems  Constitutional:  Negative for chills, fever, malaise/fatigue and weight loss.  HENT:  Negative for congestion, ear discharge and nosebleeds.   Eyes:  Negative for blurred vision.  Respiratory:  Negative for cough, hemoptysis, sputum production, shortness of breath and wheezing.   Cardiovascular:  Negative for chest pain, palpitations, orthopnea and claudication.  Gastrointestinal:  Negative for abdominal pain, blood in stool, constipation, diarrhea, heartburn, melena, nausea and vomiting.  Genitourinary:  Negative for dysuria, flank pain, frequency, hematuria and urgency.  Musculoskeletal:  Negative for back pain, joint pain and myalgias.  Skin:  Negative for rash.  Neurological:  Negative for dizziness, tingling, focal weakness, seizures, weakness and headaches.  Endo/Heme/Allergies:  Does not bruise/bleed easily.  Psychiatric/Behavioral:  Negative for depression and suicidal ideas. The patient does not have insomnia.       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    GERD (gastroesophageal reflux disease)    Headache     Hyperlipidemia    Hypertension    Statin myopathy    Stroke [facility])    Thyroid  nodule      Past Surgical History:  Procedure Laterality Date   COLONOSCOPY N/A 08/18/2023   Procedure: COLONOSCOPY;  Surgeon: Maryruth Ole DASEN, MD;  Location: [facility] ENDOSCOPY;  Service: Endoscopy;  Laterality: N/A;   ERCP N/A 08/23/2023   Procedure: ERCP, WITH INTERVENTION IF INDICATED;  Surgeon: Jinny Carmine, MD;  Location: [facility] ENDOSCOPY;  Service: Endoscopy;  Laterality: N/A;   ERCP N/A 10/03/2023   Procedure: ERCP, WITH INTERVENTION IF INDICATED;  Surgeon: Jinny Carmine, MD;  Location: [facility] ENDOSCOPY;  Service: Endoscopy;  Laterality: N/A;   ERCP N/A 12/25/2023   Procedure: ERCP, WITH INTERVENTION IF INDICATED;  Surgeon: Jinny Carmine, MD;  Location: [facility] ENDOSCOPY;  Service: Endoscopy;  Laterality: N/A;  STENT REMOVAL   MOHS SURGERY     2025, left cheek   POLYPECTOMY  08/18/2023   Procedure: POLYPECTOMY, INTESTINE;  Surgeon: Maryruth Ole DASEN, MD;  Location: [facility] ENDOSCOPY;  Service: Endoscopy;;   TONSILLECTOMY     1968    Social History   Socioeconomic History   Marital status: Divorced    Spouse name: Not on file   Number of children: Not on file   Years of education: Not on file   Highest education level: Not on file  Occupational History   Not on file  Tobacco Use  Smoking status: Former   Smokeless tobacco: Never  Vaping Use   Vaping status: Never Used  Substance and Sexual Activity   Alcohol use: Yes    Comment: occasional   Drug use: No   Sexual activity: Not on file  Other Topics Concern   Not on file  Social History Narrative   Not on file   Social Drivers of Health   Financial Resource Strain: Low Risk  (07/05/2023)   Received from [facility]   Overall Financial Resource Strain (CARDIA)    Difficulty of Paying Living Expenses: Not hard at all  Food Insecurity: No Food Insecurity (09/20/2023)   Hunger Vital Sign    Worried About Running Out of Food in  the Last Year: Never true    Ran Out of Food in the Last Year: Never true  Transportation Needs: No Transportation Needs (07/05/2023)   Received from [facility]   PRAPARE - Transportation    In the past 12 months, has lack of transportation kept you from medical appointments or from getting medications?: No    Lack of Transportation (Non-Medical): No  Physical Activity: Not on file  Stress: Not on file  Social Connections: Not on file  Intimate Partner Violence: Not At Risk (09/20/2023)   Humiliation, Afraid, Rape, and Kick questionnaire    Fear of Current or Ex-Partner: No    Emotionally Abused: No    Physically Abused: No    Sexually Abused: No    Family History  Problem Relation Age of Onset   Cancer Sister    Cancer Brother      Current Outpatient Medications:    acyclovir  (ZOVIRAX ) 400 MG tablet, Take 1 tablet (400 mg total) by mouth daily., Disp: 30 tablet, Rfl: 3   allopurinol  (ZYLOPRIM ) 300 MG tablet, TAKE 1 TABLET BY MOUTH EVERY DAY, Disp: 90 tablet, Rfl: 1   ALPRAZolam (XANAX) 1 MG tablet, Take 0.5 mg by mouth daily as needed for anxiety., Disp: , Rfl:    amphetamine-dextroamphetamine (ADDERALL XR) 20 MG 24 hr capsule, Take 20 mg by mouth daily., Disp: , Rfl:    APIXABAN  (ELIQUIS ) VTE STARTER PACK (10MG  AND 5MG ), Take as directed on package: start with two-5mg  tablets twice daily for 7 days. On day 8, switch to one-5mg  tablet twice daily., Disp: 74 each, Rfl: 0   buPROPion (WELLBUTRIN XL) 300 MG 24 hr tablet, Take 300 mg by mouth daily., Disp: , Rfl:    ezetimibe (ZETIA) 10 MG tablet, Take 1 tablet by mouth daily., Disp: , Rfl:    lactulose  (CHRONULAC ) 10 GM/15ML solution, TAKE 15 MLS (10 G TOTAL) BY MOUTH AS NEEDED FOR MODERATE CONSTIPATION., Disp: 1416 mL, Rfl: 1   levofloxacin  (LEVAQUIN ) 750 MG tablet, Take 1 tablet (750 mg total) by mouth daily., Disp: 14 tablet, Rfl: 0   lidocaine -prilocaine  (EMLA ) cream, Apply to affected area once, Disp: 30 g, Rfl:  3   nystatin  (MYCOSTATIN ) 100000 UNIT/ML suspension, Take 5 mLs (500,000 Units total) by mouth 4 (four) times daily., Disp: 60 mL, Rfl: 0   omeprazole (PRILOSEC) 40 MG capsule, Take 40 mg by mouth daily., Disp: , Rfl:    ondansetron  (ZOFRAN  ODT) 4 MG disintegrating tablet, Take 1 tablet (4 mg total) by mouth every 8 (eight) hours as needed for nausea or vomiting., Disp: 20 tablet, Rfl: 0   ondansetron  (ZOFRAN ) 8 MG tablet, Take 1 tablet (8 mg total) by mouth every 8 (eight) hours as needed for nausea or vomiting.  Start on the third day after cyclophosphamide  chemotherapy., Disp: 30 tablet, Rfl: 1   predniSONE  (DELTASONE ) 50 MG tablet, Take 100 mg by mouth daily with breakfast. Take for 5 days coinciding with chemotherapy treatment, Disp: , Rfl:    prochlorperazine  (COMPAZINE ) 10 MG tablet, Take 1 tablet (10 mg total) by mouth every 6 (six) hours as needed for nausea or vomiting., Disp: 30 tablet, Rfl: 6   telmisartan -hydrochlorothiazide  (MICARDIS  HCT) 80-25 MG tablet, Take 1 tablet by mouth daily., Disp: 90 tablet, Rfl: 0   traZODone (DESYREL) 50 MG tablet, Take 50 mg by mouth at bedtime., Disp: , Rfl:    atorvastatin (LIPITOR) 40 MG tablet, Take 40 mg by mouth daily. (Patient not taking: Reported on 12/27/2023), Disp: , Rfl:    cetirizine (ZYRTEC) 10 MG tablet, Take 10 mg by mouth. (Patient not taking: Reported on 12/27/2023), Disp: , Rfl:    Na Sulfate-K Sulfate-Mg Sulfate concentrate (SUPREP) 17.5-3.13-1.6 GM/177ML SOLN, Take 354 mLs by mouth as directed. (Patient not taking: Reported on 12/27/2023), Disp: , Rfl:    naproxen  (NAPROSYN ) 500 MG tablet, Take 1 tablet (500 mg total) by mouth 2 (two) times daily with a meal. (Patient not taking: Reported on 12/27/2023), Disp: 20 tablet, Rfl: 0   sildenafil (VIAGRA) 100 MG tablet, Take by mouth. (Patient not taking: Reported on 12/27/2023), Disp: , Rfl:  No current facility-administered medications for this visit.  Facility-Administered Medications Ordered  in Other Visits:    0.9 %  sodium chloride  infusion, , Intravenous, Continuous, Melanee Annah BROCKS, MD, Last Rate: 10 mL/hr at 12/27/23 0944, New Bag at 12/27/23 0944   acetaminophen  (TYLENOL ) tablet 650 mg, 650 mg, Oral, Once, Melanee Annah BROCKS, MD  Physical exam:  Vitals:   12/27/23 0903  BP: 96/62  Pulse: 100  Resp: 19  Temp: (!) 97 F (36.1 C)  TempSrc: Tympanic  SpO2: 98%  Weight: 213 lb 3.2 oz (96.7 kg)  Height: 5' 6 (1.676 m)   Physical Exam Cardiovascular:     Rate and Rhythm: Normal rate and regular rhythm.     Heart sounds: Normal heart sounds.  Pulmonary:     Effort: Pulmonary effort is normal.     Breath sounds: Normal breath sounds.  Abdominal:     General: Bowel sounds are normal.     Palpations: Abdomen is soft.  Skin:    General: Skin is warm and dry.  Neurological:     Mental Status: He is alert and oriented to person, place, and time.      I have personally reviewed labs listed below:    Latest Ref Rng & Units 12/27/2023    8:46 AM  CMP  Glucose 70 - 99 mg/dL 859   BUN 8 - 23 mg/dL 21   Creatinine 9.38 - 1.24 mg/dL 8.98   Sodium 864 - 854 mmol/L 135   Potassium 3.5 - 5.1 mmol/L 3.3   Chloride 98 - 111 mmol/L 101   CO2 22 - 32 mmol/L 24   Calcium 8.9 - 10.3 mg/dL 9.4   Total Protein 6.5 - 8.1 g/dL 6.5   Total Bilirubin 0.0 - 1.2 mg/dL 0.7   Alkaline Phos 38 - 126 U/L 102   AST 15 - 41 U/L 24   ALT 0 - 44 U/L 38       Latest Ref Rng & Units 12/27/2023    8:46 AM  CBC  WBC 4.0 - 10.5 K/uL 24.6   Hemoglobin 13.0 - 17.0 g/dL 13.1  Hematocrit 39.0 - 52.0 % 37.9   Platelets 150 - 400 K/uL 312    I have personally reviewed Radiology images listed below: No images are attached to the encounter.  DG C-Arm 1-60 Min-No Report Result Date: 12/25/2023 Fluoroscopy was utilized by the requesting physician.  No radiographic interpretation.     Assessment and plan- Patient is a 63 y.o. male with history of stage IV diffuse large B-cell lymphoma GCB  subtype with diffuse liver involvement here for on treatment assessment prior to cycle 5 of R-CHOP chemotherapy  Counts okay to proceed with cycle 5 of R-CHOP chemotherapy today.  He has had chronic leukocytosis over the last 3 months probably secondary to Decadron .  Clinically no signs and symptoms of infection.  He will continue to receive Neulasta  on day 3 of each cycle.  I will see him back in 3 weeks for cycle 6 which would be his last cycle.  Plan to repeat PET CT scan in 5 weeks time.  Patient had metal biliary stent that was taken out by Dr. Jinny few days ago and a plastic stent was placed.  This will be taken out in 3 months.  History of right internal jugular vein/subclavian DVT: Patient is currently on Eliquis  and will continue taking that for 6 months.  This is likely related to his port.  If PET scan shows complete metabolic response we will plan to take his port out at that time if this can be done without interruption of anticoagulation.   Visit Diagnosis 1. Diffuse large B-cell lymphoma of solid organ excluding spleen   2. Encounter for antineoplastic chemotherapy   3. Encounter for monitoring rituximab  therapy      Dr. Annah Skene, MD, MPH [facility] [phone] 12/27/2023 12:46 PM

## 2023-12-28 ENCOUNTER — Other Ambulatory Visit: Payer: Self-pay

## 2023-12-29 ENCOUNTER — Inpatient Hospital Stay: Payer: Self-pay

## 2023-12-29 DIAGNOSIS — C83398 Diffuse large b-cell lymphoma of other extranodal and solid organ sites: Secondary | ICD-10-CM

## 2023-12-29 MED ORDER — LEVOFLOXACIN 500 MG PO TABS: 500.0000 mg | ORAL_TABLET | Freq: Every day | ORAL | 0 refills | Status: DC

## 2023-12-29 MED ORDER — POTASSIUM CHLORIDE CRYS ER 20 MEQ PO TBCR: 20.0000 meq | EXTENDED_RELEASE_TABLET | Freq: Every day | ORAL | 0 refills | Status: DC

## 2023-12-29 MED ORDER — PEGFILGRASTIM-FPGK 6 MG/0.6ML ~~LOC~~ SOSY
6.0000 mg | PREFILLED_SYRINGE | Freq: Once | SUBCUTANEOUS | Status: AC
  Administered 2023-12-29: 6 mg via SUBCUTANEOUS
  Filled 2023-12-29: qty 0.6

## 2024-01-01 NOTE — Telephone Encounter (Signed)
 I called pt to let him know that Dr Jinny would like to have his labs check for his liver function... Pt states he declines to have repeat labs at this time and will prefer to f/u with Dr Melanee regarding the lab monitoring, pt then hung up while I was telling him to have a great day

## 2024-01-02 ENCOUNTER — Ambulatory Visit (INDEPENDENT_AMBULATORY_CARE_PROVIDER_SITE_OTHER): Payer: MEDICAID | Admitting: Vascular Surgery

## 2024-01-04 NOTE — Telephone Encounter (Signed)
 Component     Latest Ref Rng 12/27/2023  Sodium     135 - 145 mmol/L 135   Potassium     3.5 - 5.1 mmol/L 3.3 (L)   Chloride     98 - 111 mmol/L 101   CO2     22 - 32 mmol/L 24   Glucose     70 - 99 mg/dL 859 (H)   BUN     8 - 23 mg/dL 21   Creatinine     9.38 - 1.24 mg/dL 8.98   Calcium     8.9 - 10.3 mg/dL 9.4   Total Protein     6.5 - 8.1 g/dL 6.5   Albumin     3.5 - 5.0 g/dL 3.7   AST     15 - 41 U/L 24   ALT     0 - 44 U/L 38   Alkaline Phosphatase     38 - 126 U/L 102   Total Bilirubin     0.0 - 1.2 mg/dL 0.7   GFR, Est Non African American     >60 mL/min >60   Anion gap     5 - 15  10     Legend: (L) Low (H) High

## 2024-01-09 ENCOUNTER — Encounter: Payer: Self-pay | Admitting: Oncology

## 2024-01-09 ENCOUNTER — Telehealth: Payer: Self-pay

## 2024-01-09 ENCOUNTER — Ambulatory Visit (INDEPENDENT_AMBULATORY_CARE_PROVIDER_SITE_OTHER): Payer: MEDICAID | Admitting: Vascular Surgery

## 2024-01-09 ENCOUNTER — Other Ambulatory Visit: Payer: Self-pay

## 2024-01-09 ENCOUNTER — Encounter: Payer: Self-pay | Admitting: Hospice and Palliative Medicine

## 2024-01-09 DIAGNOSIS — K831 Obstruction of bile duct: Secondary | ICD-10-CM

## 2024-01-09 NOTE — Telephone Encounter (Signed)
 I called pt and lmovm letting pt know that we need to schedule 3 month repeat ERCP for stent removal... To hold the spot, I went ahead and scheduled pt for Nov 17th in the meantime

## 2024-01-10 ENCOUNTER — Other Ambulatory Visit: Payer: Self-pay

## 2024-01-10 NOTE — Telephone Encounter (Signed)
 Patient called back to make sure that he was scheduled for his ERCP for 03/25/2024 with DR. Wohl. I also gave the patient the number for him to call the endo unit the day before for his arrival time. Patient understood and had no further questions.

## 2024-01-16 ENCOUNTER — Encounter: Payer: Self-pay | Admitting: Oncology

## 2024-01-16 ENCOUNTER — Other Ambulatory Visit: Payer: Self-pay | Admitting: Oncology

## 2024-01-16 DIAGNOSIS — C83398 Diffuse large b-cell lymphoma of other extranodal and solid organ sites: Secondary | ICD-10-CM

## 2024-01-16 MED FILL — Fosaprepitant Dimeglumine For IV Infusion 150 MG (Base Eq): INTRAVENOUS | Qty: 5 | Status: AC

## 2024-01-17 ENCOUNTER — Encounter: Payer: Self-pay | Admitting: Oncology

## 2024-01-17 ENCOUNTER — Inpatient Hospital Stay: Payer: Self-pay | Attending: Oncology

## 2024-01-17 ENCOUNTER — Inpatient Hospital Stay: Payer: Self-pay

## 2024-01-17 ENCOUNTER — Inpatient Hospital Stay (HOSPITAL_BASED_OUTPATIENT_CLINIC_OR_DEPARTMENT_OTHER): Payer: Self-pay | Admitting: Oncology

## 2024-01-17 VITALS — BP 122/74 | HR 85 | Temp 98.5°F | Resp 18

## 2024-01-17 VITALS — BP 106/72 | HR 102 | Temp 97.9°F | Resp 20 | Ht 66.0 in | Wt 217.6 lb

## 2024-01-17 DIAGNOSIS — Z5111 Encounter for antineoplastic chemotherapy: Secondary | ICD-10-CM | POA: Insufficient documentation

## 2024-01-17 DIAGNOSIS — R042 Hemoptysis: Secondary | ICD-10-CM | POA: Insufficient documentation

## 2024-01-17 DIAGNOSIS — Z79620 Long term (current) use of immunosuppressive biologic: Secondary | ICD-10-CM

## 2024-01-17 DIAGNOSIS — Z5112 Encounter for antineoplastic immunotherapy: Secondary | ICD-10-CM | POA: Insufficient documentation

## 2024-01-17 DIAGNOSIS — Z79633 Long term (current) use of mitotic inhibitor: Secondary | ICD-10-CM | POA: Insufficient documentation

## 2024-01-17 DIAGNOSIS — Z5181 Encounter for therapeutic drug level monitoring: Secondary | ICD-10-CM

## 2024-01-17 DIAGNOSIS — C83398 Diffuse large b-cell lymphoma of other extranodal and solid organ sites: Secondary | ICD-10-CM

## 2024-01-17 DIAGNOSIS — Z5189 Encounter for other specified aftercare: Secondary | ICD-10-CM | POA: Insufficient documentation

## 2024-01-17 LAB — CMP (CANCER CENTER ONLY)
ALT: 23 U/L (ref 0–44)
AST: 25 U/L (ref 15–41)
Albumin: 3.8 g/dL (ref 3.5–5.0)
Alkaline Phosphatase: 84 U/L (ref 38–126)
Anion gap: 9 (ref 5–15)
BUN: 24 mg/dL — ABNORMAL HIGH (ref 8–23)
CO2: 24 mmol/L (ref 22–32)
Calcium: 9.1 mg/dL (ref 8.9–10.3)
Chloride: 102 mmol/L (ref 98–111)
Creatinine: 0.91 mg/dL (ref 0.61–1.24)
GFR, Estimated: >60 mL/min (ref >60)
Glucose, Bld: 131 mg/dL — ABNORMAL HIGH (ref 70–99)
Potassium: 3.6 mmol/L (ref 3.5–5.1)
Sodium: 135 mmol/L (ref 135–145)
Total Bilirubin: 0.5 mg/dL (ref 0.0–1.2)
Total Protein: 6.1 g/dL — ABNORMAL LOW (ref 6.5–8.1)

## 2024-01-17 LAB — CBC WITH DIFFERENTIAL (CANCER CENTER ONLY)
Abs Immature Granulocytes: 0.43 K/uL — ABNORMAL HIGH (ref 0.00–0.07)
Basophils Absolute: 0.1 K/uL (ref 0.0–0.1)
Basophils Relative: 0 %
Eosinophils Absolute: 0 K/uL (ref 0.0–0.5)
Eosinophils Relative: 0 %
HCT: 37.5 % — ABNORMAL LOW (ref 39.0–52.0)
Hemoglobin: 12.8 g/dL — ABNORMAL LOW (ref 13.0–17.0)
Immature Granulocytes: 2 %
Lymphocytes Relative: 5 %
Lymphs Abs: 1.2 K/uL (ref 0.7–4.0)
MCH: 32.9 pg (ref 26.0–34.0)
MCHC: 34.1 g/dL (ref 30.0–36.0)
MCV: 96.4 fL (ref 80.0–100.0)
Monocytes Absolute: 1.3 K/uL — ABNORMAL HIGH (ref 0.1–1.0)
Monocytes Relative: 6 %
Neutro Abs: 19.2 K/uL — ABNORMAL HIGH (ref 1.7–7.7)
Neutrophils Relative %: 87 %
Platelet Count: 312 K/uL (ref 150–400)
RBC: 3.89 MIL/uL — ABNORMAL LOW (ref 4.22–5.81)
RDW: 16.9 % — ABNORMAL HIGH (ref 11.5–15.5)
WBC Count: 22.2 K/uL — ABNORMAL HIGH (ref 4.0–10.5)
nRBC: 0 % (ref 0.0–0.2)

## 2024-01-17 MED ORDER — SODIUM CHLORIDE 0.9 % IV SOLN
INTRAVENOUS | Status: DC
  Filled 2024-01-17: qty 250

## 2024-01-17 MED ORDER — DOXORUBICIN HCL CHEMO IV INJECTION 2 MG/ML
50.0000 mg/m2 | Freq: Once | INTRAVENOUS | Status: AC
  Administered 2024-01-17: 108 mg via INTRAVENOUS
  Filled 2024-01-17: qty 54

## 2024-01-17 MED ORDER — ACETAMINOPHEN 325 MG PO TABS
650.0000 mg | ORAL_TABLET | Freq: Once | ORAL | Status: AC
  Administered 2024-01-17: 650 mg via ORAL
  Filled 2024-01-17: qty 2

## 2024-01-17 MED ORDER — SODIUM CHLORIDE 0.9 % IV SOLN
375.0000 mg/m2 | Freq: Once | INTRAVENOUS | Status: AC
  Administered 2024-01-17: 800 mg via INTRAVENOUS
  Filled 2024-01-17: qty 50

## 2024-01-17 MED ORDER — DEXAMETHASONE SODIUM PHOSPHATE 10 MG/ML IJ SOLN
10.0000 mg | Freq: Once | INTRAMUSCULAR | Status: AC
  Administered 2024-01-17: 10 mg via INTRAVENOUS
  Filled 2024-01-17: qty 1

## 2024-01-17 MED ORDER — PALONOSETRON HCL INJECTION 0.25 MG/5ML
0.2500 mg | Freq: Once | INTRAVENOUS | Status: AC
  Administered 2024-01-17: 0.25 mg via INTRAVENOUS
  Filled 2024-01-17: qty 5

## 2024-01-17 MED ORDER — SODIUM CHLORIDE 0.9 % IV SOLN
150.0000 mg | Freq: Once | INTRAVENOUS | Status: AC
  Administered 2024-01-17: 150 mg via INTRAVENOUS
  Filled 2024-01-17: qty 150

## 2024-01-17 MED ORDER — VINCRISTINE SULFATE CHEMO INJECTION 1 MG/ML
2.0000 mg | Freq: Once | INTRAVENOUS | Status: AC
  Administered 2024-01-17: 2 mg via INTRAVENOUS
  Filled 2024-01-17: qty 2

## 2024-01-17 MED ORDER — DIPHENHYDRAMINE HCL 25 MG PO CAPS
50.0000 mg | ORAL_CAPSULE | Freq: Once | ORAL | Status: AC
  Administered 2024-01-17: 50 mg via ORAL
  Filled 2024-01-17: qty 2

## 2024-01-17 MED ORDER — SODIUM CHLORIDE 0.9 % IV SOLN
750.0000 mg/m2 | Freq: Once | INTRAVENOUS | Status: AC
  Administered 2024-01-17: 1500 mg via INTRAVENOUS
  Filled 2024-01-17: qty 75

## 2024-01-17 NOTE — Progress Notes (Signed)
 Patient states he's still having some trouble swallowing, still has a raspy voice, he also states his resting heart rate has been a little high. He has also stopped a few medications.

## 2024-01-17 NOTE — Patient Instructions (Signed)
 [facility] - A DEPT OF [facility]  Discharge Instructions: Thank you for choosing [facility] to provide your oncology and hematology care.  If you have a lab appointment with the Cancer Center, please go directly to the Cancer Center and check in at the registration area.  Wear comfortable clothing and clothing appropriate for easy access to any Portacath or PICC line.   We strive to give you quality time with your provider. You may need to reschedule your appointment if you arrive late (15 or more minutes).  Arriving late affects you and other patients whose appointments are after yours.  Also, if you miss three or more appointments without notifying the office, you may be dismissed from the clinic at the provider's discretion.      For prescription refill requests, have your pharmacy contact our office and allow 72 hours for refills to be completed.    Today you received the following chemotherapy and/or immunotherapy agents Ariamycin, Oncovin , Cytoxan  & Rituxan       To help prevent nausea and vomiting after your treatment, we encourage you to take your nausea medication as directed.  BELOW ARE SYMPTOMS THAT SHOULD BE REPORTED IMMEDIATELY: *FEVER GREATER THAN 100.4 F (38 C) OR HIGHER *CHILLS OR SWEATING *NAUSEA AND VOMITING THAT IS NOT CONTROLLED WITH YOUR NAUSEA MEDICATION *UNUSUAL SHORTNESS OF BREATH *UNUSUAL BRUISING OR BLEEDING *URINARY PROBLEMS (pain or burning when urinating, or frequent urination) *BOWEL PROBLEMS (unusual diarrhea, constipation, pain near the anus) TENDERNESS IN MOUTH AND THROAT WITH OR WITHOUT PRESENCE OF ULCERS (sore throat, sores in mouth, or a toothache) UNUSUAL RASH, SWELLING OR PAIN  UNUSUAL VAGINAL DISCHARGE OR ITCHING   Items with * indicate a potential emergency and should be followed up as soon as possible or go to the Emergency Department if any problems should occur.  Please show the CHEMOTHERAPY  ALERT CARD or IMMUNOTHERAPY ALERT CARD at check-in to the Emergency Department and triage nurse.  Should you have questions after your visit or need to cancel or reschedule your appointment, please contact [facility] - A DEPT OF [facility]  [phone] and follow the prompts.  Office hours are 8:00 a.m. to 4:30 p.m. Monday - Friday. Please note that voicemails left after 4:00 p.m. may not be returned until the following business day.  We are closed weekends and major holidays. You have access to a nurse at all times for urgent questions. Please call the main number to the clinic [phone] and follow the prompts.  For any non-urgent questions, you may also contact your provider using MyChart. We now offer e-Visits for anyone 56 and older to request care online for non-urgent symptoms. For details visit [email or web address].   Also download the MyChart app! Go to the app store, search MyChart, open the app, select [place], and log in with your MyChart username and password.

## 2024-01-17 NOTE — Progress Notes (Signed)
 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  On the day of his colonoscopy patient noticed that he was jaundiced and therefore underwent labs which showed bilirubin of 14 on his CMP AST ALT of 70 and 108 and alkaline phosphatase of 246 respectively.  This was followed by MRI abdomen with MRCP with and without contrast which showed a large complex partially necrotic mass involving the central aspect of the liver.  Mass is involving segment 4A, segment 8 and the caudate lobe.  Also surrounds the gallbladder and is obstructing the biliary tree centrally near the confluence of the right and left hepatic ducts.  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  Compression and possible obstruction of the left portal vein.  The main portal vein is patent and the middle and right portal veins are patent.  There is tumor adjacent to and compressing the intrahepatic IVC but no evidence of occlusion.  Gastrohepatic ligament and celiac axis and periportal nodes concerning for metastatic disease.  Patient underwent ERCP by Dr. Edith on 08/23/2023 which showed a segmental biliary stricture that was malignant appearing and a plastic stent was placed.  Cells for cytology were obtained which was consistent with atypical glandular cells but not diagnostic for malignancy.    Patient was seen by Dr. Romero from pancreaticobiliary surgery at [facility] and was not deemed to be a surgical candidate.  He was then seen by Dr. Donato from medical oncology who discussed the possibility that this could be locally advanced cholangiocarcinoma and discussed possible treatment options for the same.  However since patient did not have definitive tissue diagnosis liver biopsy was recommended.  He had a port placed in the meanwhile.  Liver biopsy surprisingly did not show any evidence of cholangiocarcinoma but was consistent with diffuse large B-cell lymphoma.   Diffuse large B cell lymphoma (DLBCL), germinal center B-cell like (GCB) subtype. See comment.   Comment: Histologic sections show needle core shaped tissue with diffuse infiltrate of large atypical lymphoid cells.  No normal liver parenchyma is identified.  The immunophenotype of the large atypical lymphoid cells are consistent with DLBCL GCB subtype per Mikle algorithm.  Staining for MYC and EBER was negative.83% of nuclei showed rearrangement of the MYC (8q24) locus by interphase FISH; 44-54% of nuclei showed additional, intact copies of the BCL6 locus (3q27) and BCL2 loci. No evidence of BCL2 rearrangement, BCL6 rearrangement, or IGH::MYC rearrangement was observed. MYC rearrangement in the absence of BCL2 or BCL6 gene rearrangements, is a nonspecific finding and may be seen in Burkitt lymphoma (BL) or diffuse large B-cell lymphoma, not otherwise specified (DLBCL, NOS).       Summary Table  Locus Result  MYC (8q24) 83% with MYC rearrangement  BCL2 (18q21) 54% with 3 intact copies No evidence of BCL2 rearrangement   BCL6 (3q27) 44% with 3 intact copies No evidence of BCL6 rearrangement       PET CT scan  showed multiple hypermetabolic mass in the liver and tracer avid abdominal lymph nodes compatible with lymphoma.  There was a thyroid  nodule 2.1 cm with an SUV of 6.6 and thyroid  ultrasound was recommended for the same.  Baseline  echocardiogram normal.  Given abnormal LFTs and GCB subtype plan is to proceed with R-CHOP chemotherapy instead of R Pola CHP chemotherapy.  IPI score 2-3 low- high intemediate risk (age greater than 60 and stage IV disease.  Extranodal sites-?2-given liver/contiguous gallbladder/ bile duct involvement)      Interval history-tolerating treatments well so far.  He was found to have atypical cells on his thyroid  biopsy for which he isFollowing up with endocrinology.  Reports some change in his voice but denies any dysphagia  ECOG PS- 1   Pain scale- 0   Review of systems- Review of Systems  Constitutional:  Negative for chills, fever, malaise/fatigue and weight loss.  HENT:  Negative for congestion, ear discharge and nosebleeds.   Eyes:  Negative for blurred vision.  Respiratory:  Negative for cough, hemoptysis, sputum production, shortness of breath and wheezing.   Cardiovascular:  Negative for chest pain, palpitations, orthopnea and claudication.  Gastrointestinal:  Negative for abdominal pain, blood in stool, constipation, diarrhea, heartburn, melena, nausea and vomiting.  Genitourinary:  Negative for dysuria, flank pain, frequency, hematuria and urgency.  Musculoskeletal:  Negative for back pain, joint pain and myalgias.  Skin:  Negative for rash.  Neurological:  Negative for dizziness, tingling, focal weakness, seizures, weakness and headaches.  Endo/Heme/Allergies:  Does not bruise/bleed easily.  Psychiatric/Behavioral:  Negative for depression and suicidal ideas. The patient does not have insomnia.       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    GERD (gastroesophageal reflux disease)     Headache    Hyperlipidemia    Hypertension    Statin myopathy    Stroke [facility])    Thyroid  nodule      Past Surgical History:  Procedure Laterality Date   COLONOSCOPY N/A 08/18/2023   Procedure: COLONOSCOPY;  Surgeon: Maryruth Ole DASEN, MD;  Location: [facility] ENDOSCOPY;  Service: Endoscopy;  Laterality: N/A;   ERCP N/A 08/23/2023   Procedure: ERCP, WITH INTERVENTION IF INDICATED;  Surgeon: Jinny Carmine, MD;  Location: [facility] ENDOSCOPY;  Service: Endoscopy;  Laterality: N/A;   ERCP N/A 10/03/2023   Procedure: ERCP, WITH INTERVENTION IF INDICATED;  Surgeon: Jinny Carmine, MD;  Location: [facility] ENDOSCOPY;  Service: Endoscopy;  Laterality: N/A;   ERCP N/A 12/25/2023   Procedure: ERCP, WITH INTERVENTION IF INDICATED;  Surgeon: Jinny Carmine, MD;  Location: [facility] ENDOSCOPY;  Service: Endoscopy;  Laterality: N/A;  STENT REMOVAL   MOHS SURGERY     2025, left cheek   POLYPECTOMY  08/18/2023   Procedure: POLYPECTOMY, INTESTINE;  Surgeon: Maryruth Ole DASEN, MD;  Location: [facility] ENDOSCOPY;  Service: Endoscopy;;   TONSILLECTOMY     1968    Social History   Socioeconomic History   Marital status: Divorced    Spouse name: Not on file   Number of children: Not on file   Years of education: Not on file   Highest education level: Not on file  Occupational History   Not  on file  Tobacco Use   Smoking status: Former   Smokeless tobacco: Never  Vaping Use   Vaping status: Never Used  Substance and Sexual Activity   Alcohol use: Yes    Comment: occasional   Drug use: No   Sexual activity: Not on file  Other Topics Concern   Not on file  Social History Narrative   Not on file   Social Drivers of Health   Financial Resource Strain: Low Risk  (07/05/2023)   Received from [facility]   Overall Financial Resource Strain (CARDIA)    Difficulty of Paying Living Expenses: Not hard at all  Food Insecurity: No Food Insecurity (09/20/2023)   Hunger Vital Sign    Worried About Running Out  of Food in the Last Year: Never true    Ran Out of Food in the Last Year: Never true  Transportation Needs: No Transportation Needs (07/05/2023)   Received from [facility]   PRAPARE - Transportation    In the past 12 months, has lack of transportation kept you from medical appointments or from getting medications?: No    Lack of Transportation (Non-Medical): No  Physical Activity: Not on file  Stress: Not on file  Social Connections: Not on file  Intimate Partner Violence: Not At Risk (09/20/2023)   Humiliation, Afraid, Rape, and Kick questionnaire    Fear of Current or Ex-Partner: No    Emotionally Abused: No    Physically Abused: No    Sexually Abused: No    Family History  Problem Relation Age of Onset   Cancer Sister    Cancer Brother      Current Outpatient Medications:    acyclovir  (ZOVIRAX ) 400 MG tablet, TAKE 1 TABLET BY MOUTH EVERY DAY, Disp: 30 tablet, Rfl: 3   allopurinol  (ZYLOPRIM ) 300 MG tablet, TAKE 1 TABLET BY MOUTH EVERY DAY, Disp: 90 tablet, Rfl: 1   ALPRAZolam (XANAX) 1 MG tablet, Take 0.5 mg by mouth daily as needed for anxiety., Disp: , Rfl:    amphetamine-dextroamphetamine (ADDERALL XR) 20 MG 24 hr capsule, Take 20 mg by mouth daily., Disp: , Rfl:    APIXABAN  (ELIQUIS ) VTE STARTER PACK (10MG  AND 5MG ), Take as directed on package: start with two-5mg  tablets twice daily for 7 days. On day 8, switch to one-5mg  tablet twice daily., Disp: 74 each, Rfl: 0   atorvastatin (LIPITOR) 40 MG tablet, Take 40 mg by mouth daily. (Patient not taking: Reported on 12/27/2023), Disp: , Rfl:    buPROPion (WELLBUTRIN XL) 300 MG 24 hr tablet, Take 300 mg by mouth daily., Disp: , Rfl:    cetirizine (ZYRTEC) 10 MG tablet, Take 10 mg by mouth. (Patient not taking: Reported on 12/27/2023), Disp: , Rfl:    ezetimibe (ZETIA) 10 MG tablet, Take 1 tablet by mouth daily., Disp: , Rfl:    lactulose  (CHRONULAC ) 10 GM/15ML solution, TAKE 15 MLS (10 G TOTAL) BY MOUTH AS NEEDED FOR  MODERATE CONSTIPATION., Disp: 1416 mL, Rfl: 1   levofloxacin  (LEVAQUIN ) 500 MG tablet, Take 1 tablet (500 mg total) by mouth daily., Disp: 7 tablet, Rfl: 0   lidocaine -prilocaine  (EMLA ) cream, Apply to affected area once, Disp: 30 g, Rfl: 3   Na Sulfate-K Sulfate-Mg Sulfate concentrate (SUPREP) 17.5-3.13-1.6 GM/177ML SOLN, Take 354 mLs by mouth as directed. (Patient not taking: Reported on 12/27/2023), Disp: , Rfl:    naproxen  (NAPROSYN ) 500 MG tablet, Take 1 tablet (500 mg total) by mouth 2 (two) times daily with  a meal. (Patient not taking: Reported on 12/27/2023), Disp: 20 tablet, Rfl: 0   nystatin  (MYCOSTATIN ) 100000 UNIT/ML suspension, Take 5 mLs (500,000 Units total) by mouth 4 (four) times daily., Disp: 60 mL, Rfl: 0   omeprazole (PRILOSEC) 40 MG capsule, Take 40 mg by mouth daily., Disp: , Rfl:    ondansetron  (ZOFRAN  ODT) 4 MG disintegrating tablet, Take 1 tablet (4 mg total) by mouth every 8 (eight) hours as needed for nausea or vomiting., Disp: 20 tablet, Rfl: 0   ondansetron  (ZOFRAN ) 8 MG tablet, Take 1 tablet (8 mg total) by mouth every 8 (eight) hours as needed for nausea or vomiting. Start on the third day after cyclophosphamide  chemotherapy., Disp: 30 tablet, Rfl: 1   potassium chloride  SA (KLOR-CON  M) 20 MEQ tablet, Take 1 tablet (20 mEq total) by mouth daily., Disp: 30 tablet, Rfl: 0   predniSONE  (DELTASONE ) 50 MG tablet, Take 100 mg by mouth daily with breakfast. Take for 5 days coinciding with chemotherapy treatment, Disp: , Rfl:    prochlorperazine  (COMPAZINE ) 10 MG tablet, Take 1 tablet (10 mg total) by mouth every 6 (six) hours as needed for nausea or vomiting., Disp: 30 tablet, Rfl: 6   sildenafil (VIAGRA) 100 MG tablet, Take by mouth. (Patient not taking: Reported on 12/27/2023), Disp: , Rfl:    telmisartan -hydrochlorothiazide  (MICARDIS  HCT) 80-25 MG tablet, Take 1 tablet by mouth daily., Disp: 90 tablet, Rfl: 0   traZODone (DESYREL) 50 MG tablet, Take 50 mg by mouth at bedtime.,  Disp: , Rfl:   Physical exam: There were no vitals filed for this visit. Physical Exam HENT:     Mouth/Throat:     Mouth: Mucous membranes are moist.     Pharynx: Oropharynx is clear.  Cardiovascular:     Rate and Rhythm: Normal rate and regular rhythm.     Heart sounds: Normal heart sounds.  Pulmonary:     Effort: Pulmonary effort is normal.     Breath sounds: Normal breath sounds.  Abdominal:     General: Bowel sounds are normal.     Palpations: Abdomen is soft.  Skin:    General: Skin is warm and dry.  Neurological:     Mental Status: He is alert and oriented to person, place, and time.      I have personally reviewed labs listed below:    Latest Ref Rng & Units 12/27/2023    8:46 AM  CMP  Glucose 70 - 99 mg/dL 859   BUN 8 - 23 mg/dL 21   Creatinine 9.38 - 1.24 mg/dL 8.98   Sodium 864 - 854 mmol/L 135   Potassium 3.5 - 5.1 mmol/L 3.3   Chloride 98 - 111 mmol/L 101   CO2 22 - 32 mmol/L 24   Calcium 8.9 - 10.3 mg/dL 9.4   Total Protein 6.5 - 8.1 g/dL 6.5   Total Bilirubin 0.0 - 1.2 mg/dL 0.7   Alkaline Phos 38 - 126 U/L 102   AST 15 - 41 U/L 24   ALT 0 - 44 U/L 38       Latest Ref Rng & Units 12/27/2023    8:46 AM  CBC  WBC 4.0 - 10.5 K/uL 24.6   Hemoglobin 13.0 - 17.0 g/dL 86.8   Hematocrit 60.9 - 52.0 % 37.9   Platelets 150 - 400 K/uL 312      Assessment and plan- Patient is a 63 y.o. male  with history of stage IV diffuse large B-cell lymphoma GCB subtype  with diffuse liver involvement.  He is here for on treatment assessment prior to cycle 6 of R-CHOP chemotherapy  Counts okay to proceed with R-CHOP chemotherapy today with Neulasta  support.  This would be his last cycle.  He takes 100 mg of prednisone  for 5 days with every cycle.  His PET scan is scheduled for 01/31/2024.  I will see him back 2 weeks after scans to discuss results and further management  Leukocytosis probably secondary to steroids.  I will see if it goes down after he completes treatment  today and repeat labs when I see him back after PET scan   Visit Diagnosis 1. Encounter for antineoplastic chemotherapy   2. Encounter for monitoring rituximab  therapy   3. Diffuse large B-cell lymphoma of solid organ excluding spleen      Dr. Annah Skene, MD, MPH [facility] [phone] 01/17/2024 12:26 PM

## 2024-01-18 ENCOUNTER — Encounter: Payer: Self-pay | Admitting: Oncology

## 2024-01-19 ENCOUNTER — Telehealth: Payer: Self-pay | Admitting: *Deleted

## 2024-01-19 ENCOUNTER — Encounter: Payer: Self-pay | Admitting: Nurse Practitioner

## 2024-01-19 ENCOUNTER — Inpatient Hospital Stay (HOSPITAL_BASED_OUTPATIENT_CLINIC_OR_DEPARTMENT_OTHER): Payer: Self-pay | Admitting: Nurse Practitioner

## 2024-01-19 ENCOUNTER — Other Ambulatory Visit: Payer: Self-pay

## 2024-01-19 ENCOUNTER — Inpatient Hospital Stay: Payer: Self-pay

## 2024-01-19 VITALS — BP 118/79 | HR 93 | Temp 97.9°F | Resp 20 | Ht 66.0 in | Wt 220.0 lb

## 2024-01-19 DIAGNOSIS — Z09 Encounter for follow-up examination after completed treatment for conditions other than malignant neoplasm: Secondary | ICD-10-CM

## 2024-01-19 DIAGNOSIS — C83398 Diffuse large b-cell lymphoma of other extranodal and solid organ sites: Secondary | ICD-10-CM

## 2024-01-19 DIAGNOSIS — R042 Hemoptysis: Secondary | ICD-10-CM

## 2024-01-19 MED ORDER — PILOCARPINE HCL 5 MG PO TABS: 5.0000 mg | ORAL_TABLET | Freq: Three times a day (TID) | ORAL | 0 refills | Status: DC

## 2024-01-19 MED ORDER — PEGFILGRASTIM-FPGK 6 MG/0.6ML ~~LOC~~ SOSY
6.0000 mg | PREFILLED_SYRINGE | Freq: Once | SUBCUTANEOUS | Status: AC
  Administered 2024-01-19: 6 mg via SUBCUTANEOUS
  Filled 2024-01-19: qty 0.6

## 2024-01-19 MED ORDER — FLUCONAZOLE 100 MG PO TABS: ORAL_TABLET | ORAL | 0 refills | Status: AC

## 2024-01-19 NOTE — Progress Notes (Signed)
 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  On the day of his colonoscopy patient noticed that he was jaundiced and therefore underwent labs which showed bilirubin of 14 on his CMP AST ALT of 70 and 108 and alkaline phosphatase of 246 respectively.  This was followed by MRI abdomen with MRCP with and without contrast which showed a large complex partially necrotic mass involving the central aspect of the liver.  Mass is involving segment 4A, segment 8 and the caudate lobe.  Also surrounds the gallbladder and is obstructing the biliary tree centrally near the confluence of the right and left hepatic ducts.  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  Compression and possible obstruction of the left portal vein.  The main portal vein is patent and the middle and right portal veins are patent.  There is tumor adjacent to and compressing the intrahepatic IVC but no evidence of occlusion.  Gastrohepatic ligament and celiac axis and periportal nodes concerning for metastatic disease.  Patient underwent ERCP by Dr. Edith on 08/23/2023 which showed a segmental biliary stricture that was malignant appearing and a plastic stent was placed.  Cells for cytology were obtained which was consistent with atypical glandular cells but not diagnostic for malignancy.   Patient was seen by Dr. Romero from pancreaticobiliary  surgery at [facility] and was not deemed to be a surgical candidate.  He was then seen by Dr. Donato from medical oncology who discussed the possibility that this could be locally advanced cholangiocarcinoma and discussed possible treatment options for the same.  However since patient did not have definitive tissue diagnosis liver biopsy was recommended.  He had a port placed in the meanwhile.  Liver biopsy surprisingly did not show any evidence of cholangiocarcinoma but was consistent with diffuse large B-cell lymphoma.   Diffuse large B cell lymphoma (DLBCL), germinal center B-cell like (GCB) subtype. See comment.   Comment: Histologic sections show needle core shaped tissue with diffuse infiltrate of large atypical lymphoid cells.  No normal liver parenchyma is identified.  The immunophenotype of the large atypical lymphoid cells are consistent with DLBCL GCB subtype per Mikle algorithm.  Staining for MYC and EBER was negative.83% of nuclei showed rearrangement of the MYC (8q24) locus by interphase FISH; 44-54% of nuclei showed additional, intact copies of the BCL6 locus (3q27) and BCL2 loci. No evidence of BCL2 rearrangement, BCL6 rearrangement, or IGH::MYC rearrangement was observed. MYC rearrangement in the absence of BCL2 or BCL6 gene rearrangements, is a nonspecific finding and may be seen in Burkitt lymphoma (BL) or diffuse large B-cell lymphoma, not otherwise specified (DLBCL, NOS).       Summary Table  Locus Result  MYC (8q24) 83% with MYC rearrangement  BCL2 (18q21) 54% with 3 intact copies No evidence of BCL2 rearrangement   BCL6 (3q27) 44% with 3 intact copies No evidence of BCL6 rearrangement     PET CT scan showed multiple hypermetabolic mass in the liver and tracer avid abdominal lymph  nodes compatible with lymphoma.  There was a thyroid  nodule 2.1 cm with an SUV of 6.6 and thyroid  ultrasound was recommended for the same.  Baseline echocardiogram normal.  Given abnormal LFTs and GCB subtype  plan is to proceed with R-CHOP chemotherapy instead of R Pola CHP chemotherapy.  IPI score 2-3 low- high intemediate risk (age greater than 60 and stage IV disease.  Extranodal sites-?2-given liver/contiguous gallbladder/ bile duct involvement)      Interval history-patient is a 63 year old male with above history of diffuse large B-cell lymphoma status post 6 cycles of R-CHOP chemotherapy who presents to symptom management clinic for complaints of hemoptysis.  He was previously seen in symptom management clinic on 11/23/2023 with complaints of sore throat and feeling swollen.  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  He was started on Eliquis .  He was evaluated by vascular surgery who recommended treating with anticoagulation and keeping his port in place.  If symptoms became debilitating, port would be removed and consideration of thrombectomy at that time.  Once port could be removed, will would do so with recommendation for venogram if needed.  Today, he says that he continues to have sore throat, sensation of something in his throat which he coughs up and sees blood. Previously was small specks of blood or tinge of blood but now has noticed clots. He denies difficulty swallowing. No congestion. No neck pain or swelling. No chest pain. Otherwise denies complaints.   ECOG PS- 1  Pain scale- 0  Review of systems- Review of Systems  Constitutional:  Negative for chills, fever, malaise/fatigue and weight loss.  HENT:  Positive for sore throat. Negative for congestion, ear discharge and nosebleeds.   Eyes:  Negative for blurred vision.  Respiratory:  Positive for hemoptysis. Negative for cough, sputum production, shortness of breath and wheezing.   Cardiovascular:  Negative for chest pain, palpitations, orthopnea and claudication.  Gastrointestinal:  Negative for abdominal pain, blood in stool, constipation, diarrhea, heartburn, melena, nausea and  vomiting.  Genitourinary:  Negative for dysuria, flank pain, frequency, hematuria and urgency.  Musculoskeletal:  Negative for back pain, joint pain and myalgias.  Skin:  Negative for rash.  Neurological:  Negative for dizziness, tingling, focal weakness, seizures, weakness and headaches.  Endo/Heme/Allergies:  Does not bruise/bleed easily.  Psychiatric/Behavioral:  Negative for depression and suicidal ideas. The patient does not have insomnia.     Allergies  Allergen Reactions   Hydrocodone-Acetaminophen  Other (See Comments)   Penicillins     Childhood allergy   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    GERD (gastroesophageal reflux disease)    Headache    Hyperlipidemia    Hypertension    Statin myopathy    Stroke [facility])    Thyroid  nodule    Past Surgical History:  Procedure Laterality Date   COLONOSCOPY N/A 08/18/2023   Procedure: COLONOSCOPY;  Surgeon: Maryruth Ole DASEN, MD;  Location: [facility] ENDOSCOPY;  Service: Endoscopy;  Laterality: N/A;   ERCP N/A 08/23/2023   Procedure: ERCP, WITH INTERVENTION IF INDICATED;  Surgeon: Jinny Carmine, MD;  Location: [facility] ENDOSCOPY;  Service: Endoscopy;  Laterality: N/A;   ERCP N/A 10/03/2023   Procedure: ERCP, WITH INTERVENTION IF INDICATED;  Surgeon: Jinny Carmine, MD;  Location: [facility] ENDOSCOPY;  Service: Endoscopy;  Laterality: N/A;   ERCP N/A 12/25/2023   Procedure: ERCP, WITH INTERVENTION IF INDICATED;  Surgeon: Jinny Carmine, MD;  Location: [facility] ENDOSCOPY;  Service: Endoscopy;  Laterality: N/A;  STENT REMOVAL   MOHS SURGERY     2025, left cheek   POLYPECTOMY  08/18/2023   Procedure: POLYPECTOMY, INTESTINE;  Surgeon: Maryruth Ole DASEN, MD;  Location: [facility] ENDOSCOPY;  Service: Endoscopy;;   TONSILLECTOMY     1968   Social History   Socioeconomic History   Marital  status: Divorced    Spouse name: Not on file   Number of children: Not on file   Years of education: Not on file   Highest education level: Not on file  Occupational History   Not on file  Tobacco Use   Smoking status: Former   Smokeless tobacco: Never  Vaping Use   Vaping status: Never Used  Substance and Sexual Activity   Alcohol use: Yes    Comment: occasional   Drug use: No   Sexual activity: Not on file  Other Topics Concern   Not on file  Social History Narrative   Not on file   Social Drivers of Health   Financial Resource Strain: Low Risk  (07/05/2023)   Received [facility]   Overall Financial Resource Strain (CARDIA)    Difficulty of Paying Living Expenses: Not hard at all  Food Insecurity: No Food Insecurity (09/20/2023)   Hunger Vital Sign    Worried About Running Out of Food in the Last Year: Never true    Ran Out of Food in the Last Year: Never true  Transportation Needs: No Transportation Needs (07/05/2023)   Received from [facility]   PRAPARE - Transportation    In the past 12 months, has lack of transportation kept you from medical appointments or from getting medications?: No    Lack of Transportation (Non-Medical): No  Physical Activity: Not on file  Stress: Not on file  Social Connections: Not on file  Intimate Partner Violence: Not At Risk (09/20/2023)   Humiliation, Afraid, Rape, and Kick questionnaire    Fear of Current or Ex-Partner: No    Emotionally Abused: No    Physically Abused: No    Sexually Abused: No   Family History  Problem Relation Age of Onset   Cancer Sister    Cancer Brother     Current Outpatient Medications:    acyclovir  (ZOVIRAX ) 400 MG tablet, TAKE 1 TABLET BY MOUTH EVERY DAY, Disp: 30 tablet, Rfl: 3   allopurinol  (ZYLOPRIM ) 300 MG tablet, TAKE 1 TABLET BY MOUTH EVERY DAY, Disp: 90 tablet, Rfl: 1   ALPRAZolam (XANAX) 1 MG tablet, Take 0.5 mg by mouth daily as needed for anxiety.,  Disp: , Rfl:    APIXABAN  (ELIQUIS ) VTE STARTER PACK (10MG  AND 5MG ), Take as directed on package: start with two-5mg  tablets twice daily for 7 days. On day 8, switch to one-5mg  tablet twice daily., Disp: 74 each, Rfl: 0   buPROPion (WELLBUTRIN XL) 300 MG 24 hr tablet, Take 300 mg by mouth daily., Disp: , Rfl:    lactulose  (CHRONULAC ) 10 GM/15ML solution, TAKE 15 MLS (10 G TOTAL) BY MOUTH AS NEEDED FOR MODERATE CONSTIPATION., Disp: 1416 mL, Rfl: 1   lidocaine -prilocaine  (EMLA ) cream, Apply to affected area once, Disp: 30 g, Rfl: 3   potassium chloride  SA (KLOR-CON  M) 20 MEQ tablet, Take 1 tablet (20 mEq total) by mouth daily., Disp: 30 tablet, Rfl: 0  telmisartan -hydrochlorothiazide  (MICARDIS  HCT) 80-25 MG tablet, Take 1 tablet by mouth daily., Disp: 90 tablet, Rfl: 0   traZODone (DESYREL) 50 MG tablet, Take 50 mg by mouth at bedtime., Disp: , Rfl:    amphetamine-dextroamphetamine (ADDERALL XR) 20 MG 24 hr capsule, Take 20 mg by mouth daily. (Patient not taking: Reported on 01/19/2024), Disp: , Rfl:    atomoxetine (STRATTERA) 25 MG capsule, Take 25 mg by mouth. (Patient not taking: Reported on 01/19/2024), Disp: , Rfl:    atorvastatin (LIPITOR) 40 MG tablet, Take 40 mg by mouth daily. (Patient not taking: Reported on 01/19/2024), Disp: , Rfl:    cetirizine (ZYRTEC) 10 MG tablet, Take 10 mg by mouth. (Patient not taking: Reported on 01/19/2024), Disp: , Rfl:    ezetimibe (ZETIA) 10 MG tablet, Take 1 tablet by mouth daily. (Patient not taking: Reported on 01/19/2024), Disp: , Rfl:    levofloxacin  (LEVAQUIN ) 500 MG tablet, Take 1 tablet (500 mg total) by mouth daily. (Patient not taking: Reported on 01/19/2024), Disp: 7 tablet, Rfl: 0   Na Sulfate-K Sulfate-Mg Sulfate concentrate (SUPREP) 17.5-3.13-1.6 GM/177ML SOLN, Take 354 mLs by mouth as directed. (Patient not taking: Reported on 01/19/2024), Disp: , Rfl:    naproxen  (NAPROSYN ) 500 MG tablet, Take 1 tablet (500 mg total) by mouth 2 (two) times daily with a  meal. (Patient not taking: Reported on 01/19/2024), Disp: 20 tablet, Rfl: 0   nystatin  (MYCOSTATIN ) 100000 UNIT/ML suspension, Take 5 mLs (500,000 Units total) by mouth 4 (four) times daily. (Patient not taking: Reported on 01/19/2024), Disp: 60 mL, Rfl: 0   omeprazole (PRILOSEC) 40 MG capsule, Take 40 mg by mouth daily. (Patient not taking: Reported on 01/19/2024), Disp: , Rfl:    ondansetron  (ZOFRAN  ODT) 4 MG disintegrating tablet, Take 1 tablet (4 mg total) by mouth every 8 (eight) hours as needed for nausea or vomiting. (Patient not taking: Reported on 01/19/2024), Disp: 20 tablet, Rfl: 0   ondansetron  (ZOFRAN ) 8 MG tablet, Take 1 tablet (8 mg total) by mouth every 8 (eight) hours as needed for nausea or vomiting. Start on the third day after cyclophosphamide  chemotherapy. (Patient not taking: Reported on 01/19/2024), Disp: 30 tablet, Rfl: 1   predniSONE  (DELTASONE ) 50 MG tablet, Take 100 mg by mouth daily with breakfast. Take for 5 days coinciding with chemotherapy treatment (Patient not taking: Reported on 01/19/2024), Disp: , Rfl:    prochlorperazine  (COMPAZINE ) 10 MG tablet, Take 1 tablet (10 mg total) by mouth every 6 (six) hours as needed for nausea or vomiting. (Patient not taking: Reported on 01/19/2024), Disp: 30 tablet, Rfl: 6   sildenafil (VIAGRA) 100 MG tablet, Take by mouth. (Patient not taking: Reported on 01/19/2024), Disp: , Rfl:   Physical exam:  Vitals:   01/19/24 1030 01/19/24 1038  BP: 118/79   Pulse: 93   Resp: 20   Temp: 97.9 F (36.6 C)   TempSrc: Tympanic   Weight:  220 lb (99.8 kg)  Height:  5' 6 (1.676 m)   Physical Exam Vitals reviewed.  Constitutional:      Appearance: He is not ill-appearing.  HENT:     Nose: No congestion or rhinorrhea.     Mouth/Throat:     Mouth: Mucous membranes are moist. No oral lesions.     Pharynx: Posterior oropharyngeal erythema present. No oropharyngeal exudate.  Cardiovascular:     Rate and Rhythm: Normal rate and regular rhythm.   Pulmonary:     Effort: Pulmonary effort is normal. No respiratory distress.  Breath sounds: Normal breath sounds.  Abdominal:     General: Bowel sounds are normal.     Palpations: Abdomen is soft.  Skin:    General: Skin is warm and dry.  Neurological:     Mental Status: He is alert and oriented to person, place, and time.  Psychiatric:        Mood and Affect: Mood normal.        Behavior: Behavior normal.     I have personally reviewed labs listed below:    Latest Ref Rng & Units 01/17/2024    8:42 AM  CBC  WBC 4.0 - 10.5 K/uL 22.2   Hemoglobin 13.0 - 17.0 g/dL 87.1   Hematocrit 60.9 - 52.0 % 37.5   Platelets 150 - 400 K/uL 312     Assessment and plan- Patient is a 63 y.o. 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  Less likely malignancy or PE. Likely mutlifactorial.  No evidence recommend treating empirically with diflucan  200 mg x 1 then 100 mg daily x 13 days. Will add pilocarbine 5 mg TID for dry mouth to see if this improves his symptoms. We discussed that if he was having heavy bleeding then he needs to go to ER for evaluation with GI.  History of port associated DVT- Continue anticoagulation as prescribed.  Lymphoma- s/p 6 cycles of R-CHOP chemotherapy with neulasta  support. He has pet upcoming and will follow up with Dr Melanee for results- la  Disposition:  As scheduled- la   Visit Diagnosis 1. Hemoptysis   2. Chemotherapy follow-up examination    Tinnie Dawn, DNP, AGNP-C, [facility] at [facility] [phone] (clinic) 01/19/2024

## 2024-01-19 NOTE — Telephone Encounter (Signed)
 Patient called and said that he got chemo on Wednesday and he has been having a sore throat and cough and a little bit and he started spitting up blood.  He says it is like little clots.  Has an appointment this morning at 10:30 to get an injection.  I told him that I am going to send this to Dr. Melanee and see what she wants us  to do and yes please go ahead and come in.

## 2024-01-20 ENCOUNTER — Other Ambulatory Visit: Payer: Self-pay | Admitting: Oncology

## 2024-01-21 ENCOUNTER — Other Ambulatory Visit: Payer: Self-pay | Admitting: Oncology

## 2024-01-22 ENCOUNTER — Encounter: Payer: Self-pay | Admitting: Oncology

## 2024-01-22 ENCOUNTER — Encounter: Payer: Self-pay | Admitting: Hospice and Palliative Medicine

## 2024-01-29 ENCOUNTER — Encounter: Payer: Self-pay | Admitting: Oncology

## 2024-01-31 ENCOUNTER — Ambulatory Visit
Admission: RE | Admit: 2024-01-31 | Discharge: 2024-01-31 | Disposition: A | Discharge status: Home / Self Care | Payer: Self-pay | Source: Ambulatory Visit | Attending: Oncology | Admitting: Oncology

## 2024-01-31 ENCOUNTER — Inpatient Hospital Stay: Payer: Self-pay

## 2024-01-31 DIAGNOSIS — C83398 Diffuse large b-cell lymphoma of other extranodal and solid organ sites: Secondary | ICD-10-CM | POA: Insufficient documentation

## 2024-01-31 DIAGNOSIS — E041 Nontoxic single thyroid nodule: Secondary | ICD-10-CM | POA: Insufficient documentation

## 2024-01-31 DIAGNOSIS — R160 Hepatomegaly, not elsewhere classified: Secondary | ICD-10-CM | POA: Insufficient documentation

## 2024-01-31 DIAGNOSIS — E119 Type 2 diabetes mellitus without complications: Secondary | ICD-10-CM | POA: Insufficient documentation

## 2024-01-31 DIAGNOSIS — Z9221 Personal history of antineoplastic chemotherapy: Secondary | ICD-10-CM | POA: Insufficient documentation

## 2024-01-31 DIAGNOSIS — X58XXXA Exposure to other specified factors, initial encounter: Secondary | ICD-10-CM | POA: Insufficient documentation

## 2024-01-31 LAB — CMP (CANCER CENTER ONLY)
ALT: 34 U/L (ref 0–44)
AST: 35 U/L (ref 15–41)
Albumin: 4 g/dL (ref 3.5–5.0)
Alkaline Phosphatase: 93 U/L (ref 38–126)
Anion gap: 10 (ref 5–15)
BUN: 18 mg/dL (ref 8–23)
CO2: 23 mmol/L (ref 22–32)
Calcium: 9.3 mg/dL (ref 8.9–10.3)
Chloride: 99 mmol/L (ref 98–111)
Creatinine: 0.93 mg/dL (ref 0.61–1.24)
GFR, Estimated: >60 mL/min (ref >60)
Glucose, Bld: 122 mg/dL — ABNORMAL HIGH (ref 70–99)
Potassium: 3.9 mmol/L (ref 3.5–5.1)
Sodium: 132 mmol/L — ABNORMAL LOW (ref 135–145)
Total Bilirubin: 0.7 mg/dL (ref 0.0–1.2)
Total Protein: 6.9 g/dL (ref 6.5–8.1)

## 2024-01-31 LAB — CBC WITH DIFFERENTIAL (CANCER CENTER ONLY)
Abs Immature Granulocytes: 1.07 K/uL — ABNORMAL HIGH (ref 0.00–0.07)
Basophils Absolute: 0.2 K/uL — ABNORMAL HIGH (ref 0.0–0.1)
Basophils Relative: 2 %
Eosinophils Absolute: 0.1 K/uL (ref 0.0–0.5)
Eosinophils Relative: 1 %
HCT: 39.4 % (ref 39.0–52.0)
Hemoglobin: 13.7 g/dL (ref 13.0–17.0)
Immature Granulocytes: 8 %
Lymphocytes Relative: 8 %
Lymphs Abs: 1.1 K/uL (ref 0.7–4.0)
MCH: 33.3 pg (ref 26.0–34.0)
MCHC: 34.8 g/dL (ref 30.0–36.0)
MCV: 95.9 fL (ref 80.0–100.0)
Monocytes Absolute: 0.8 K/uL (ref 0.1–1.0)
Monocytes Relative: 6 %
Neutro Abs: 10.1 K/uL — ABNORMAL HIGH (ref 1.7–7.7)
Neutrophils Relative %: 75 %
Platelet Count: 234 K/uL (ref 150–400)
RBC: 4.11 MIL/uL — ABNORMAL LOW (ref 4.22–5.81)
RDW: 15.5 % (ref 11.5–15.5)
Smear Review: NORMAL
WBC Count: 13.4 K/uL — ABNORMAL HIGH (ref 4.0–10.5)
nRBC: 0.1 % (ref 0.0–0.2)

## 2024-01-31 LAB — MISCELLANEOUS TEST

## 2024-01-31 LAB — GLUCOSE, CAPILLARY: Glucose-Capillary: 104 mg/dL — ABNORMAL HIGH (ref 70–99)

## 2024-01-31 MED ORDER — FLUDEOXYGLUCOSE F - 18 (FDG) INJECTION
11.4000 | Freq: Once | INTRAVENOUS | Status: AC | PRN
Start: 2024-01-31 — End: 2024-01-31
  Administered 2024-01-31: 11.97 via INTRAVENOUS

## 2024-02-05 ENCOUNTER — Encounter: Payer: Self-pay | Admitting: Oncology

## 2024-02-06 ENCOUNTER — Encounter: Payer: Self-pay | Admitting: Oncology

## 2024-02-08 ENCOUNTER — Encounter: Payer: Self-pay | Admitting: Oncology

## 2024-02-10 ENCOUNTER — Other Ambulatory Visit: Payer: Self-pay | Admitting: Nurse Practitioner

## 2024-02-13 ENCOUNTER — Encounter: Payer: Self-pay | Admitting: Oncology

## 2024-02-13 ENCOUNTER — Encounter: Payer: Self-pay | Admitting: Hospice and Palliative Medicine

## 2024-02-14 ENCOUNTER — Inpatient Hospital Stay: Payer: Self-pay

## 2024-02-14 ENCOUNTER — Inpatient Hospital Stay: Payer: Self-pay | Attending: Oncology | Admitting: Oncology

## 2024-02-14 ENCOUNTER — Telehealth: Payer: Self-pay

## 2024-02-14 ENCOUNTER — Encounter: Payer: Self-pay | Admitting: Oncology

## 2024-02-14 VITALS — BP 103/84 | HR 106 | Temp 97.4°F | Resp 16 | Wt 220.0 lb

## 2024-02-14 DIAGNOSIS — C83398 Diffuse large b-cell lymphoma of other extranodal and solid organ sites: Secondary | ICD-10-CM | POA: Insufficient documentation

## 2024-02-14 DIAGNOSIS — Z08 Encounter for follow-up examination after completed treatment for malignant neoplasm: Secondary | ICD-10-CM

## 2024-02-14 DIAGNOSIS — Z7901 Long term (current) use of anticoagulants: Secondary | ICD-10-CM | POA: Insufficient documentation

## 2024-02-14 DIAGNOSIS — Z87891 Personal history of nicotine dependence: Secondary | ICD-10-CM | POA: Insufficient documentation

## 2024-02-14 LAB — CBC WITH DIFFERENTIAL (CANCER CENTER ONLY)
Abs Immature Granulocytes: 0.08 K/uL — ABNORMAL HIGH (ref 0.00–0.07)
Basophils Absolute: 0.1 K/uL (ref 0.0–0.1)
Basophils Relative: 1 %
Eosinophils Absolute: 0.1 K/uL (ref 0.0–0.5)
Eosinophils Relative: 1 %
HCT: 36.8 % — ABNORMAL LOW (ref 39.0–52.0)
Hemoglobin: 13 g/dL (ref 13.0–17.0)
Immature Granulocytes: 1 %
Lymphocytes Relative: 20 %
Lymphs Abs: 1.8 K/uL (ref 0.7–4.0)
MCH: 33.6 pg (ref 26.0–34.0)
MCHC: 35.3 g/dL (ref 30.0–36.0)
MCV: 95.1 fL (ref 80.0–100.0)
Monocytes Absolute: 0.7 K/uL (ref 0.1–1.0)
Monocytes Relative: 8 %
Neutro Abs: 6.2 K/uL (ref 1.7–7.7)
Neutrophils Relative %: 69 %
Platelet Count: 243 K/uL (ref 150–400)
RBC: 3.87 MIL/uL — ABNORMAL LOW (ref 4.22–5.81)
RDW: 14.7 % (ref 11.5–15.5)
WBC Count: 9 K/uL (ref 4.0–10.5)
nRBC: 0 % (ref 0.0–0.2)

## 2024-02-14 LAB — CMP (CANCER CENTER ONLY)
ALT: 24 U/L (ref 0–44)
AST: 34 U/L (ref 15–41)
Albumin: 3.9 g/dL (ref 3.5–5.0)
Alkaline Phosphatase: 64 U/L (ref 38–126)
Anion gap: 9 (ref 5–15)
BUN: 19 mg/dL (ref 8–23)
CO2: 24 mmol/L (ref 22–32)
Calcium: 9.2 mg/dL (ref 8.9–10.3)
Chloride: 102 mmol/L (ref 98–111)
Creatinine: 0.9 mg/dL (ref 0.61–1.24)
GFR, Estimated: >60 mL/min (ref >60)
Glucose, Bld: 125 mg/dL — ABNORMAL HIGH (ref 70–99)
Potassium: 3.6 mmol/L (ref 3.5–5.1)
Sodium: 135 mmol/L (ref 135–145)
Total Bilirubin: 0.5 mg/dL (ref 0.0–1.2)
Total Protein: 6.3 g/dL — ABNORMAL LOW (ref 6.5–8.1)

## 2024-02-14 NOTE — Telephone Encounter (Signed)
 Outbound call to [facility] to check status of Clonoseq.  Spoke to [place] who indicated expected report date lists today 02/14/24 but the Clonality order is also still processing.  Will likely depend on that clonality order processing first; the estimated date for the clonality is 10/11.  Will keep open until resulted.

## 2024-02-14 NOTE — Progress Notes (Signed)
 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  On the day of his colonoscopy patient noticed that he was jaundiced and therefore underwent labs which showed bilirubin of 14 on his CMP AST ALT of 70 and 108 and alkaline phosphatase of 246 respectively.  This was followed by MRI abdomen with MRCP with and without contrast which showed a large complex partially necrotic mass involving the central aspect of the liver.  Mass is involving segment 4A, segment 8 and the caudate lobe.  Also surrounds the gallbladder and is obstructing the biliary tree centrally near the confluence of the right and left hepatic ducts.  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  Compression and possible obstruction of the left portal vein.  The main portal vein is patent and the middle and right portal veins are patent.  There is tumor adjacent to and compressing the intrahepatic IVC but no evidence of occlusion.  Gastrohepatic ligament and celiac axis and periportal nodes concerning for metastatic disease.  Patient underwent ERCP by Dr. Edith on 08/23/2023 which showed a segmental biliary stricture that was malignant appearing and a plastic stent was placed.  Cells for cytology were obtained which was consistent with atypical glandular cells but not diagnostic for malignancy.   Patient was seen  by Dr. Romero from pancreaticobiliary surgery at [facility] and was not deemed to be a surgical candidate.  He was then seen by Dr. Donato from medical oncology who discussed the possibility that this could be locally advanced cholangiocarcinoma and discussed possible treatment options for the same.  However since patient did not have definitive tissue diagnosis liver biopsy was recommended.  He had a port placed in the meanwhile.  Liver biopsy surprisingly did not show any evidence of cholangiocarcinoma but was consistent with diffuse large B-cell lymphoma.   Diffuse large B cell lymphoma (DLBCL), germinal center B-cell like (GCB) subtype. See comment.   Comment: Histologic sections show needle core shaped tissue with diffuse infiltrate of large atypical lymphoid cells.  No normal liver parenchyma is identified.  The immunophenotype of the large atypical lymphoid cells are consistent with DLBCL GCB subtype per Mikle algorithm.  Staining for MYC and EBER was negative.83% of nuclei showed rearrangement of the MYC (8q24) locus by interphase FISH; 44-54% of nuclei showed additional, intact copies of the BCL6 locus (3q27) and BCL2 loci. No evidence of BCL2 rearrangement, BCL6 rearrangement, or IGH::MYC rearrangement was observed. MYC rearrangement in the absence of BCL2 or BCL6 gene rearrangements, is a nonspecific finding and may be seen in Burkitt lymphoma (BL) or diffuse large B-cell lymphoma, not otherwise specified (DLBCL, NOS).       Summary Table  Locus Result  MYC (8q24) 83% with MYC rearrangement  BCL2 (18q21) 54% with 3 intact copies No evidence of BCL2 rearrangement   BCL6 (3q27) 44% with 3 intact copies No evidence of BCL6 rearrangement       PET CT scan showed multiple hypermetabolic mass  in the liver and tracer avid abdominal lymph nodes compatible with lymphoma.  There was a thyroid  nodule 2.1 cm with an SUV of 6.6 and thyroid  ultrasound was recommended for the same.  Baseline echocardiogram  normal.  Given abnormal LFTs and GCB subtype plan is to proceed with R-CHOP chemotherapy instead of R Pola CHP chemotherapy.  IPI score 2-3 low- high intemediate risk (age greater than 60 and stage IV disease.  Extranodal sites-?2-given liver/contiguous gallbladder/ bile duct involvement).  Patient completed 6 cycles of R-CHOP chemotherapy on 01/31/2024      Interval history- Joel [name] is a 64 year old male with lymphoma who presents for follow-up after six cycles of chemotherapy.  He is feeling overall well today and denies any complaints at this time.  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  HENT:  Negative for congestion, ear discharge and nosebleeds.   Eyes:  Negative for blurred vision.  Respiratory:  Negative for cough, hemoptysis, sputum production, shortness of breath and wheezing.   Cardiovascular:  Negative for chest pain, palpitations, orthopnea and claudication.  Gastrointestinal:  Negative for abdominal pain, blood in stool, constipation, diarrhea, heartburn, melena, nausea and vomiting.  Genitourinary:  Negative for dysuria, flank pain, frequency, hematuria and urgency.  Musculoskeletal:  Negative for back pain, joint pain and myalgias.  Skin:  Negative for rash.  Neurological:  Negative for dizziness, tingling, focal weakness, seizures, weakness and headaches.  Endo/Heme/Allergies:  Does not bruise/bleed easily.  Psychiatric/Behavioral:  Negative for depression and suicidal ideas. The patient does not have insomnia.       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    GERD (gastroesophageal reflux disease)    Headache    Hyperlipidemia    Hypertension    Statin myopathy    Stroke [facility])    Thyroid  nodule      Past Surgical History:  Procedure Laterality Date   COLONOSCOPY N/A 08/18/2023   Procedure: COLONOSCOPY;  Surgeon: Maryruth Ole DASEN, MD;  Location: [facility] ENDOSCOPY;  Service: Endoscopy;  Laterality: N/A;   ERCP N/A 08/23/2023   Procedure: ERCP, WITH INTERVENTION IF INDICATED;  Surgeon: Jinny Carmine, MD;  Location: [facility] ENDOSCOPY;  Service: Endoscopy;  Laterality: N/A;   ERCP N/A 10/03/2023   Procedure: ERCP, WITH INTERVENTION IF INDICATED;  Surgeon: Jinny Carmine, MD;  Location: [facility] ENDOSCOPY;  Service: Endoscopy;  Laterality: N/A;   ERCP N/A 12/25/2023   Procedure: ERCP, WITH INTERVENTION IF INDICATED;  Surgeon: Jinny Carmine, MD;  Location: [facility] ENDOSCOPY;  Service: Endoscopy;  Laterality: N/A;  STENT REMOVAL   MOHS SURGERY     2025, left cheek   POLYPECTOMY  08/18/2023   Procedure: POLYPECTOMY, INTESTINE;  Surgeon: Maryruth Ole DASEN, MD;  Location: [facility] ENDOSCOPY;  Service: Endoscopy;;   TONSILLECTOMY     1968    Social History   Socioeconomic History   Marital status: Divorced    Spouse name: Not on file   Number of children: Not on file   Years of education: Not  on file   Highest education level: Not on file  Occupational History   Not on file  Tobacco Use   Smoking status: Former   Smokeless tobacco: Never  Vaping Use   Vaping status: Never Used  Substance and Sexual Activity   Alcohol use: Yes    Comment: occasional   Drug use: No   Sexual activity: Not on file  Other Topics Concern   Not on file  Social History Narrative   Not on file   Social Drivers of Health   Financial Resource Strain: Low Risk  (07/05/2023)   Received from [facility]   Overall Financial Resource Strain (CARDIA)    Difficulty of Paying Living Expenses: Not hard at all   Food Insecurity: No Food Insecurity (09/20/2023)   Hunger Vital Sign    Worried About Running Out of Food in the Last Year: Never true    Ran Out of Food in the Last Year: Never true  Transportation Needs: No Transportation Needs (07/05/2023)   Received from [facility]   PRAPARE - Transportation    In the past 12 months, has lack of transportation kept you from medical appointments or from getting medications?: No    Lack of Transportation (Non-Medical): No  Physical Activity: Not on file  Stress: Not on file  Social Connections: Not on file  Intimate Partner Violence: Not At Risk (09/20/2023)   Humiliation, Afraid, Rape, and Kick questionnaire    Fear of Current or Ex-Partner: No    Emotionally Abused: No    Physically Abused: No    Sexually Abused: No    Family History  Problem Relation Age of Onset   Cancer Sister    Cancer Brother      Current Outpatient Medications:    acyclovir  (ZOVIRAX ) 400 MG tablet, TAKE 1 TABLET BY MOUTH EVERY DAY, Disp: 30 tablet, Rfl: 3   allopurinol  (ZYLOPRIM ) 300 MG tablet, TAKE 1 TABLET BY MOUTH EVERY DAY, Disp: 90 tablet, Rfl: 1   ALPRAZolam (XANAX) 1 MG tablet, Take 0.5 mg by mouth daily as needed for anxiety., Disp: , Rfl:    amphetamine-dextroamphetamine (ADDERALL XR) 20 MG 24 hr capsule, Take 20 mg by mouth daily. (Patient not taking: Reported on 01/19/2024), Disp: , Rfl:    APIXABAN  (ELIQUIS ) VTE STARTER PACK (10MG  AND 5MG ), Take as directed on package: start with two-5mg  tablets twice daily for 7 days. On day 8, switch to one-5mg  tablet twice daily., Disp: 74 each, Rfl: 0   atomoxetine (STRATTERA) 25 MG capsule, Take 25 mg by mouth. (Patient not taking: Reported on 01/19/2024), Disp: , Rfl:    atorvastatin (LIPITOR) 40 MG tablet, Take 40 mg by mouth daily. (Patient not taking: Reported on 01/19/2024), Disp: , Rfl:    buPROPion (WELLBUTRIN XL) 300 MG 24 hr tablet, Take 300 mg by mouth daily., Disp: , Rfl:    cetirizine (ZYRTEC)  10 MG tablet, Take 10 mg by mouth. (Patient not taking: Reported on 01/19/2024), Disp: , Rfl:    ezetimibe (ZETIA) 10 MG tablet, Take 1 tablet by mouth daily. (Patient not taking: Reported on 01/19/2024), Disp: , Rfl:    lactulose  (CHRONULAC ) 10 GM/15ML solution, TAKE 15 MLS (10 G TOTAL) BY MOUTH AS NEEDED FOR MODERATE CONSTIPATION., Disp: 200 mL, Rfl: 1   levofloxacin  (LEVAQUIN ) 500 MG tablet, Take 1 tablet (500 mg total) by mouth daily. (Patient not taking: Reported on 01/19/2024), Disp: 7 tablet, Rfl: 0   lidocaine -prilocaine  (EMLA ) cream, Apply to  affected area once, Disp: 30 g, Rfl: 3   Na Sulfate-K Sulfate-Mg Sulfate concentrate (SUPREP) 17.5-3.13-1.6 GM/177ML SOLN, Take 354 mLs by mouth as directed. (Patient not taking: Reported on 01/19/2024), Disp: , Rfl:    naproxen  (NAPROSYN ) 500 MG tablet, Take 1 tablet (500 mg total) by mouth 2 (two) times daily with a meal. (Patient not taking: Reported on 01/19/2024), Disp: 20 tablet, Rfl: 0   nystatin  (MYCOSTATIN ) 100000 UNIT/ML suspension, Take 5 mLs (500,000 Units total) by mouth 4 (four) times daily. (Patient not taking: Reported on 01/19/2024), Disp: 60 mL, Rfl: 0   omeprazole (PRILOSEC) 40 MG capsule, Take 40 mg by mouth daily. (Patient not taking: Reported on 01/19/2024), Disp: , Rfl:    ondansetron  (ZOFRAN  ODT) 4 MG disintegrating tablet, Take 1 tablet (4 mg total) by mouth every 8 (eight) hours as needed for nausea or vomiting. (Patient not taking: Reported on 01/19/2024), Disp: 20 tablet, Rfl: 0   ondansetron  (ZOFRAN ) 8 MG tablet, Take 1 tablet (8 mg total) by mouth every 8 (eight) hours as needed for nausea or vomiting. Start on the third day after cyclophosphamide  chemotherapy. (Patient not taking: Reported on 01/19/2024), Disp: 30 tablet, Rfl: 1   pilocarpine  (SALAGEN ) 5 MG tablet, TAKE 1 TABLET BY MOUTH THREE TIMES A DAY, Disp: 270 tablet, Rfl: 1   potassium chloride  SA (KLOR-CON  M) 20 MEQ tablet, TAKE 1 TABLET BY MOUTH EVERY DAY, Disp: 90 tablet,  Rfl: 1   predniSONE  (DELTASONE ) 50 MG tablet, Take 100 mg by mouth daily with breakfast. Take for 5 days coinciding with chemotherapy treatment (Patient not taking: Reported on 01/19/2024), Disp: , Rfl:    prochlorperazine  (COMPAZINE ) 10 MG tablet, Take 1 tablet (10 mg total) by mouth every 6 (six) hours as needed for nausea or vomiting. (Patient not taking: Reported on 01/19/2024), Disp: 30 tablet, Rfl: 6   sildenafil (VIAGRA) 100 MG tablet, Take by mouth. (Patient not taking: Reported on 01/19/2024), Disp: , Rfl:    telmisartan -hydrochlorothiazide  (MICARDIS  HCT) 80-25 MG tablet, Take 1 tablet by mouth daily., Disp: 90 tablet, Rfl: 0   traZODone (DESYREL) 50 MG tablet, Take 50 mg by mouth at bedtime., Disp: , Rfl:   Physical exam: There were no vitals filed for this visit. Physical Exam Cardiovascular:     Rate and Rhythm: Normal rate and regular rhythm.     Heart sounds: Normal heart sounds.  Pulmonary:     Effort: Pulmonary effort is normal.     Breath sounds: Normal breath sounds.  Abdominal:     General: Bowel sounds are normal.     Palpations: Abdomen is soft.  Skin:    General: Skin is warm and dry.  Neurological:     Mental Status: He is alert and oriented to person, place, and time.      I have personally reviewed labs listed below:    Latest Ref Rng & Units 01/31/2024   10:20 AM  CMP  Glucose 70 - 99 mg/dL 877   BUN 8 - 23 mg/dL 18   Creatinine 9.38 - 1.24 mg/dL 9.06   Sodium 864 - 854 mmol/L 132   Potassium 3.5 - 5.1 mmol/L 3.9   Chloride 98 - 111 mmol/L 99   CO2 22 - 32 mmol/L 23   Calcium 8.9 - 10.3 mg/dL 9.3   Total Protein 6.5 - 8.1 g/dL 6.9   Total Bilirubin 0.0 - 1.2 mg/dL 0.7   Alkaline Phos 38 - 126 U/L 93   AST 15 - 41  U/L 35   ALT 0 - 44 U/L 34       Latest Ref Rng & Units 01/31/2024   10:20 AM  CBC  WBC 4.0 - 10.5 K/uL 13.4   Hemoglobin 13.0 - 17.0 g/dL 86.2   Hematocrit 60.9 - 52.0 % 39.4   Platelets 150 - 400 K/uL 234    I have personally  reviewed Radiology images listed below: No images are attached to the encounter.  NM PET Image Restag (PS) Skull Base To Thigh Result Date: 02/01/2024 CLINICAL DATA:  Subsequent treatment strategy for tumor type. Diffuse large B-cell lymphoma EXAM: NUCLEAR MEDICINE PET SKULL BASE TO THIGH TECHNIQUE: 12.0 mCi F-18 FDG was injected intravenously. Full-ring PET imaging was performed from the skull base to thigh after the radiotracer. CT data was obtained and used for attenuation correction and anatomic localization. Fasting blood glucose: 104 mg/dl COMPARISON:  PET-CT scan 11/26/1918 FINDINGS: Mediastinal blood pool activity: SUV max 3.0 Liver activity: SUV max 4.5 NECK: Persistent hypermetabolic nodule in the RIGHT lobe of thyroid  gland. No hypermetabolic or enlarged cervical lymph nodes. Incidental CT findings: None. CHEST: No hypermetabolic mediastinal or hilar nodes. No suspicious pulmonary nodules on the CT scan. Incidental CT findings: None. ABDOMEN/PELVIS: Continued interval decrease in size and metabolic activity the hepatic mass. Mass measures 4.7 x 3.5 cm (image 75) compared to 5.55.0 cm mass no longer significant activity. Activity is (image 70 Biliary stent in place No hypermetabolic upper abdominal lymph nodes. Incidental CT findings: None SKELETON: No focal hypermetabolic activity to suggest skeletal metastasis. Uniform increase in marrow activity. Findings consistent with marrow hyperplasia related to G CSF. Incidental CT findings: None. IMPRESSION: 1. Continued interval decrease in size and metabolic activity of hepatic mass. Deauville 3 2. No evidence of lymphoma recurrence on whole-body FDG PET scan. 3. Persistent hypermetabolic nodule in the RIGHT lobe of thyroid  gland. Thyroid  biopsy recommended on ultrasound 11/24/2023 Electronically Signed   By: Jackquline Boxer M.D.   On: 02/01/2024 13:31     Assessment and plan- Patient is a 63 y.o. male with history of stage IV diffuse large B-cell  lymphoma GCB subtype with diffuse liver involvement s/p 6 cycles of R-CHOP chemotherapy here to discuss PET scan results and further management  Non-Hodgkin lymphoma, post-treatment surveillance I have reviewed PET CT scan images independently and discussed findings with the patient.  Post-treatment PET scan showed significant improvement with reduced liver mass and no significant activity, indicating treated lymphoma. Deauville score of 3 confirmed no residual brightness. No further chemotherapy needed. Clonosequencing pending for circulating lymphoma cells. - Order CT or PET scan in 6 months. - Perform Clonaseq testing in 3 months. - Call with Clonaseq test results when available.  Malignant neoplasm of right thyroid  gland Thyroid  ultrasound showed a 2.7 cm nodule.  I do not have the pathology results available but patient states that there was some concern for malignant cells found on biopsy.  He has been seen by endocrinology Dr. Tamea in the past.  Referred to [facility] for evaluation and management. - Maintain appointment with [facility] in November for surgical evaluation.  Right internal jugular vein thrombosis, port-associated, on anticoagulation Thrombosis associated with port in right internal jugular vein. On anticoagulation therapy. Port removal planned due to completion of lymphoma treatment and thrombosis history. - Schedule port removal. - Continue anticoagulation until port removal.  I will wait to get his port taken out until we have the results of clonoseq testing - Discontinue anticoagulation mid  to late October after port removal and clonoseq testing  Biliary stent, scheduled for removal Biliary stent placement was temporary. PET scan showed no contraindications for removal. - Proceed with biliary stent removal as scheduled on November 17th.    Visit Diagnosis 1. Encounter for follow-up surveillance of diffuse large B-cell lymphoma       Dr. Annah Skene, MD, MPH [facility] [phone] 02/14/2024 10:05 AM

## 2024-02-15 ENCOUNTER — Encounter: Payer: Self-pay | Admitting: Oncology

## 2024-02-15 ENCOUNTER — Other Ambulatory Visit: Payer: Self-pay

## 2024-02-15 ENCOUNTER — Encounter: Payer: Self-pay | Admitting: Hospice and Palliative Medicine

## 2024-02-15 NOTE — Telephone Encounter (Signed)
 Clonoseq MRD and Clonality reports have resulted; forwarded to Dr. Melanee.

## 2024-02-16 ENCOUNTER — Encounter: Payer: Self-pay | Admitting: Oncology

## 2024-03-08 ENCOUNTER — Encounter: Payer: Self-pay | Admitting: Oncology

## 2024-03-08 ENCOUNTER — Encounter: Payer: Self-pay | Admitting: Hospice and Palliative Medicine

## 2024-03-14 ENCOUNTER — Ambulatory Visit: Payer: Self-pay | Admitting: Surgery

## 2024-03-22 ENCOUNTER — Encounter: Payer: Self-pay | Admitting: Hospice and Palliative Medicine

## 2024-03-22 ENCOUNTER — Encounter: Payer: Self-pay | Admitting: Oncology

## 2024-03-25 ENCOUNTER — Ambulatory Visit: Payer: Self-pay | Admitting: Anesthesiology

## 2024-03-25 ENCOUNTER — Encounter: Payer: Self-pay | Admitting: Gastroenterology

## 2024-03-25 ENCOUNTER — Ambulatory Visit: Payer: Self-pay

## 2024-03-25 ENCOUNTER — Ambulatory Visit
Admission: RE | Admit: 2024-03-25 | Discharge: 2024-03-25 | Disposition: A | Discharge status: Home / Self Care | Payer: Self-pay | Attending: Gastroenterology | Admitting: Gastroenterology

## 2024-03-25 ENCOUNTER — Encounter
Admission: RE | Disposition: A | Discharge status: Home / Self Care | Payer: Self-pay | Source: Home / Self Care | Attending: Gastroenterology

## 2024-03-25 DIAGNOSIS — J449 Chronic obstructive pulmonary disease, unspecified: Secondary | ICD-10-CM | POA: Insufficient documentation

## 2024-03-25 DIAGNOSIS — F909 Attention-deficit hyperactivity disorder, unspecified type: Secondary | ICD-10-CM | POA: Insufficient documentation

## 2024-03-25 DIAGNOSIS — Z4659 Encounter for fitting and adjustment of other gastrointestinal appliance and device: Secondary | ICD-10-CM | POA: Insufficient documentation

## 2024-03-25 DIAGNOSIS — I10 Essential (primary) hypertension: Secondary | ICD-10-CM | POA: Insufficient documentation

## 2024-03-25 DIAGNOSIS — Z86718 Personal history of other venous thrombosis and embolism: Secondary | ICD-10-CM | POA: Insufficient documentation

## 2024-03-25 DIAGNOSIS — Z7901 Long term (current) use of anticoagulants: Secondary | ICD-10-CM | POA: Insufficient documentation

## 2024-03-25 DIAGNOSIS — K831 Obstruction of bile duct: Secondary | ICD-10-CM

## 2024-03-25 DIAGNOSIS — Z87891 Personal history of nicotine dependence: Secondary | ICD-10-CM | POA: Insufficient documentation

## 2024-03-25 DIAGNOSIS — K8051 Calculus of bile duct without cholangitis or cholecystitis with obstruction: Secondary | ICD-10-CM | POA: Insufficient documentation

## 2024-03-25 DIAGNOSIS — Z8673 Personal history of transient ischemic attack (TIA), and cerebral infarction without residual deficits: Secondary | ICD-10-CM | POA: Insufficient documentation

## 2024-03-25 DIAGNOSIS — G709 Myoneural disorder, unspecified: Secondary | ICD-10-CM | POA: Insufficient documentation

## 2024-03-25 HISTORY — PX: ERCP: SHX5425

## 2024-03-25 SURGERY — ERCP, WITH INTERVENTION IF INDICATED
Anesthesia: General

## 2024-03-25 MED ORDER — LACTATED RINGERS IV SOLN: INTRAVENOUS | Status: DC

## 2024-03-25 MED ORDER — FENTANYL CITRATE (PF) 100 MCG/2ML IJ SOLN
INTRAMUSCULAR | Status: DC | PRN
  Administered 2024-03-25 (×2): 50 ug via INTRAVENOUS

## 2024-03-25 MED ORDER — PROPOFOL 1000 MG/100ML IV EMUL
INTRAVENOUS | Status: AC
  Filled 2024-03-25: qty 100

## 2024-03-25 MED ORDER — DICLOFENAC SUPPOSITORY 100 MG: 100.0000 mg | Freq: Once | RECTAL | Status: DC

## 2024-03-25 MED ORDER — FENTANYL CITRATE (PF) 100 MCG/2ML IJ SOLN
INTRAMUSCULAR | Status: AC
  Filled 2024-03-25: qty 2

## 2024-03-25 MED ORDER — SODIUM CHLORIDE 0.9 % IV SOLN: INTRAVENOUS | Status: DC

## 2024-03-25 MED ORDER — PROPOFOL 10 MG/ML IV BOLUS
INTRAVENOUS | Status: DC | PRN
  Administered 2024-03-25: 50 mg via INTRAVENOUS

## 2024-03-25 MED ORDER — DICLOFENAC SUPPOSITORY 100 MG
RECTAL | Status: AC
  Filled 2024-03-25: qty 1

## 2024-03-25 MED ORDER — PROPOFOL 500 MG/50ML IV EMUL
INTRAVENOUS | Status: DC | PRN
  Administered 2024-03-25: 100 ug/kg/min via INTRAVENOUS

## 2024-03-25 NOTE — Transfer of Care (Signed)
 Immediate Anesthesia Transfer of Care Note  Patient: Joel [name]  Procedure(s) Performed: ERCP, WITH INTERVENTION IF INDICATED  Patient Location: PACU  Anesthesia Type:General  Level of Consciousness: awake  Airway & Oxygen Therapy: Patient Spontanous Breathing  Post-op Assessment: Report given to RN and Post -op Vital signs reviewed and stable  Post vital signs: Reviewed and stable  Last Vitals:  Vitals Value Taken Time  BP 108/74 03/25/24 12:16  Temp    Pulse 90 03/25/24 12:17  Resp 11 03/25/24 12:17  SpO2 94 % 03/25/24 12:17  Vitals shown include unfiled device data.  Last Pain:  Vitals:   03/25/24 1112  TempSrc: Temporal  PainSc: 0-No pain         Complications: No notable events documented.

## 2024-03-25 NOTE — H&P (Signed)
 Rogelia Copping, MD [facility] [address] [place], [place] [address] Phone:[phone] Fax : [phone]  Primary Care Physician:  Auston Reyes BIRCH, MD Primary Gastroenterologist:  Dr. Copping  Pre-Procedure History & Physical: HPI:  Joel [name] is a 63 y.o. male is here for an ERCP.   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    GERD (gastroesophageal reflux disease)    Headache    Hyperlipidemia    Hypertension    Statin myopathy    Stroke [facility])    Thyroid  nodule     Past Surgical History:  Procedure Laterality Date   COLONOSCOPY N/A 08/18/2023   Procedure: COLONOSCOPY;  Surgeon: Maryruth Ole DASEN, MD;  Location: [facility] ENDOSCOPY;  Service: Endoscopy;  Laterality: N/A;   ERCP N/A 08/23/2023   Procedure: ERCP, WITH INTERVENTION IF INDICATED;  Surgeon: Copping Rogelia, MD;  Location: [facility] ENDOSCOPY;  Service: Endoscopy;  Laterality: N/A;   ERCP N/A 10/03/2023   Procedure: ERCP, WITH INTERVENTION IF INDICATED;  Surgeon: Copping Rogelia, MD;  Location: [facility] ENDOSCOPY;  Service: Endoscopy;  Laterality: N/A;   ERCP N/A 12/25/2023   Procedure: ERCP, WITH INTERVENTION IF INDICATED;  Surgeon: Copping Rogelia, MD;  Location: [facility] ENDOSCOPY;  Service: Endoscopy;  Laterality: N/A;  STENT REMOVAL   MOHS SURGERY     2025, left cheek   POLYPECTOMY  08/18/2023   Procedure: POLYPECTOMY, INTESTINE;  Surgeon: Maryruth Ole DASEN, MD;  Location: [facility] ENDOSCOPY;  Service: Endoscopy;;   TONSILLECTOMY     1968    Prior to Admission medications   Medication Sig Start Date End Date Taking? Authorizing Provider  allopurinol  (ZYLOPRIM ) 300 MG tablet TAKE 1 TABLET BY MOUTH EVERY DAY 12/18/23  Yes Rao, Archana C, MD  ALPRAZolam (XANAX) 1 MG tablet Take 0.5 mg by mouth daily as needed for anxiety.   Yes [provider]   buPROPion (WELLBUTRIN XL) 300 MG 24 hr tablet Take 300 mg by mouth daily. 04/14/23  Yes [provider]  potassium chloride  SA (KLOR-CON  M) 20 MEQ tablet TAKE 1 TABLET BY MOUTH EVERY DAY 01/22/24  Yes Melanee Annah BROCKS, MD  telmisartan -hydrochlorothiazide  (MICARDIS  HCT) 80-25 MG tablet Take 1 tablet by mouth daily. 05/31/17  Yes Viviann Pastor, MD  acyclovir  (ZOVIRAX ) 400 MG tablet TAKE 1 TABLET BY MOUTH EVERY DAY 01/16/24   Rao, Archana C, MD  amphetamine-dextroamphetamine (ADDERALL XR) 20 MG 24 hr capsule Take 20 mg by mouth daily. Patient not taking: Reported on 02/14/2024    [provider]  APIXABAN  (ELIQUIS ) VTE STARTER PACK (10MG  AND 5MG ) Take as directed on package: start with two-5mg  tablets twice daily for 7 days. On day 8, switch to one-5mg  tablet twice daily. 11/24/23   Borders, Fonda SAUNDERS, NP  atomoxetine (STRATTERA) 25 MG capsule Take 25 mg by mouth. Patient not taking: Reported on 02/14/2024 01/03/24 01/02/25  [provider]  atorvastatin (LIPITOR) 40 MG tablet Take 40 mg by mouth daily. Patient not taking: Reported on 02/14/2024    [provider]  cetirizine (ZYRTEC) 10 MG tablet Take 10 mg by mouth. Patient not taking: Reported on 02/14/2024    [provider]  ezetimibe (ZETIA) 10 MG tablet Take 1 tablet by mouth daily. Patient not taking: Reported on 02/14/2024 07/05/23 07/04/24  [provider]  lactulose  (CHRONULAC ) 10 GM/15ML solution  TAKE 15 MLS (10 G TOTAL) BY MOUTH AS NEEDED FOR MODERATE CONSTIPATION. 01/22/24   Melanee Annah BROCKS, MD  levofloxacin  (LEVAQUIN ) 500 MG tablet Take 1 tablet (500 mg total) by mouth daily. Patient not taking: Reported on 02/14/2024 12/29/23   Melanee Annah BROCKS, MD  lidocaine -prilocaine  (EMLA ) cream Apply to affected area once 09/20/23   Melanee Annah BROCKS, MD  Na Sulfate-K Sulfate-Mg Sulfate concentrate (SUPREP) 17.5-3.13-1.6 GM/177ML SOLN Take 354 mLs by mouth as directed. Patient not taking: Reported on 01/19/2024  06/02/23   [provider]  naproxen  (NAPROSYN ) 500 MG tablet Take 1 tablet (500 mg total) by mouth 2 (two) times daily with a meal. Patient not taking: Reported on 02/14/2024 05/31/17   Viviann Pastor, MD  nystatin  (MYCOSTATIN ) 100000 UNIT/ML suspension Take 5 mLs (500,000 Units total) by mouth 4 (four) times daily. Patient not taking: Reported on 02/14/2024 11/23/23   Borders, Fonda SAUNDERS, NP  omeprazole (PRILOSEC) 40 MG capsule Take 40 mg by mouth daily. Patient not taking: Reported on 02/14/2024    [provider]  ondansetron  (ZOFRAN  ODT) 4 MG disintegrating tablet Take 1 tablet (4 mg total) by mouth every 8 (eight) hours as needed for nausea or vomiting. Patient not taking: Reported on 02/14/2024 05/31/17   Viviann Pastor, MD  ondansetron  (ZOFRAN ) 8 MG tablet Take 1 tablet (8 mg total) by mouth every 8 (eight) hours as needed for nausea or vomiting. Start on the third day after cyclophosphamide  chemotherapy. Patient not taking: No sig reported 09/20/23   Melanee Annah BROCKS, MD  pilocarpine  (SALAGEN ) 5 MG tablet TAKE 1 TABLET BY MOUTH THREE TIMES A DAY Patient not taking: Reported on 03/25/2024 02/13/24   Dasie Tinnie MATSU, NP  predniSONE  (DELTASONE ) 50 MG tablet Take 100 mg by mouth daily with breakfast. Take for 5 days coinciding with chemotherapy treatment Patient not taking: Reported on 02/14/2024    [provider]  prochlorperazine  (COMPAZINE ) 10 MG tablet Take 1 tablet (10 mg total) by mouth every 6 (six) hours as needed for nausea or vomiting. Patient not taking: Reported on 02/14/2024 09/20/23   Melanee Annah BROCKS, MD  sildenafil (VIAGRA) 100 MG tablet Take by mouth. Patient not taking: Reported on 02/14/2024 08/05/23   [provider]  traZODone (DESYREL) 50 MG tablet Take 50 mg by mouth at bedtime.    [provider]    Allergies as of 01/09/2024 - Review Complete 12/27/2023  Allergen Reaction Noted   Hydrocodone-acetaminophen  Other (See Comments)  12/02/2013   Penicillins  01/28/2017    Family History  Problem Relation Age of Onset   Cancer Sister    Cancer Brother     Social History   Socioeconomic History   Marital status: Divorced    Spouse name: Not on file   Number of children: Not on file   Years of education: Not on file   Highest education level: Not on file  Occupational History   Not on file  Tobacco Use   Smoking status: Former   Smokeless tobacco: Never  Vaping Use   Vaping status: Never Used  Substance and Sexual Activity   Alcohol use: Yes    Comment: occasional   Drug use: No   Sexual activity: Not on file  Other Topics Concern   Not on file  Social History Narrative   Not on file   Social Drivers of Health   Financial Resource Strain: Low Risk  (07/05/2023)   Received from [facility]   Overall  Financial Resource Strain (CARDIA)    Difficulty of Paying Living Expenses: Not hard at all  Food Insecurity: No Food Insecurity (09/20/2023)   Hunger Vital Sign    Worried About Running Out of Food in the Last Year: Never true    Ran Out of Food in the Last Year: Never true  Transportation Needs: No Transportation Needs (07/05/2023)   [facility] - Transportation    In the past 12 months, has lack of transportation kept you from medical appointments or from getting medications?: No    Lack of Transportation (Non-Medical): No  Physical Activity: Not on file  Stress: Not on file  Social Connections: Not on file  Intimate Partner Violence: Not At Risk (09/20/2023)   Humiliation, Afraid, Rape, and Kick questionnaire    Fear of Current or Ex-Partner: No    Emotionally Abused: No    Physically Abused: No    Sexually Abused: No    Review of Systems: See HPI, otherwise negative ROS  Physical Exam: BP 114/78   Pulse 82   Temp (!) 96.5 F (35.8 C) (Temporal)   Resp 18   Ht 5' 6 (1.676 m)   Wt 100.2 kg   SpO2 96%   BMI 35.67 kg/m   General:   Alert,  pleasant and cooperative in NAD Head:  Normocephalic and atraumatic. Neck:  Supple; no masses or thyromegaly. Lungs:  Clear throughout to auscultation.    Heart:  Regular rate and rhythm. Abdomen:  Soft, nontender and nondistended. Normal bowel sounds, without guarding, and without rebound.   Neurologic:  Alert and  oriented x4;  grossly normal neurologically.  Impression/Plan: [name] is here for an ERCP to be performed for bile duct stricture  Risks, benefits, limitations, and alternatives regarding  ERCP have been reviewed with the patient.  Questions have been answered.  All parties agreeable.   Rogelia Copping, MD  03/25/2024, 11:40 AM

## 2024-03-25 NOTE — Anesthesia Preprocedure Evaluation (Addendum)
 Anesthesia Evaluation  Patient identified by MRN, date of birth, ID band Patient awake    Reviewed: Allergy & Precautions, H&P , NPO status , Patient's Chart, lab work & pertinent test results, reviewed documented beta blocker date and time   History of Anesthesia Complications Negative for: history of anesthetic complications  Airway Mallampati: IV  TM Distance: <3 FB Neck ROM: full    Dental  (+) Dental Advidsory Given, Caps, Chipped   Pulmonary neg shortness of breath, COPD, neg recent URI, former smoker   Pulmonary exam normal        Cardiovascular Exercise Tolerance: Good hypertension, (-) angina + DVT  (-) Past MI and (-) Cardiac Stents Normal cardiovascular exam(-) dysrhythmias (-) Valvular Problems/Murmurs  ECG 07/20/23: 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; RBBB, LAFB; bifascicular block; no STEMI  Echo 10/18/22:  Normal Stress Echocardiogram  NORMAL RIGHT VENTRICULAR SYSTOLIC FUNCTION  TRIVIAL REGURGITATION NOTED  NO VALVULAR STENOSIS NOTED     Neuro/Psych  Headaches, neg Seizures PSYCHIATRIC DISORDERS (ADHD)       Neuromuscular disease CVA (Ct scan with evidence of old stroke)    GI/Hepatic ,GERD  Medicated and Controlled,,  Endo/Other  negative endocrine ROSneg diabetes    Renal/GU negative Renal ROS  negative genitourinary   Musculoskeletal   Abdominal  (+) + obese  Peds  Hematology negative hematology ROS (+)   Anesthesia Other Findings Past Medical History: No date: ADD (attention deficit disorder) No date: Carpal tunnel syndrome, bilateral No date: COPD (chronic obstructive pulmonary disease) (HCC) No date: 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 No date: GERD (gastroesophageal reflux disease) No date: Headache No date: Hyperlipidemia No date: Hypertension No date: Statin myopathy No date: Thyroid  nodule   Reproductive/Obstetrics negative OB ROS                               Anesthesia Physical Anesthesia Plan  ASA: 3  Anesthesia Plan: General   Post-op Pain Management: Minimal or no pain anticipated   Induction: Intravenous  PONV Risk Score and Plan: 2 and Propofol  infusion, TIVA and Treatment may vary due to age or medical condition  Airway Management Planned: Natural Airway and Nasal Cannula  Additional Equipment:   Intra-op Plan:   Post-operative Plan:   Informed Consent: I have reviewed the patients History and Physical, chart, labs and discussed the procedure including the risks, benefits and alternatives for the proposed anesthesia with the patient or authorized representative who has indicated his/her understanding and acceptance.     Dental Advisory Given  Plan Discussed with: Anesthesiologist, CRNA and Surgeon  Anesthesia Plan Comments: (Patient consented for risks of anesthesia including but not limited to:  - adverse reactions to medications - risk of airway placement if required - damage to eyes, teeth, lips or other oral mucosa - nerve damage due to positioning  - sore throat or hoarseness - Damage to heart, brain, nerves, lungs, other parts of body or loss of life  Patient voiced understanding and assent.)         Anesthesia Quick Evaluation

## 2024-03-25 NOTE — Anesthesia Postprocedure Evaluation (Signed)
 Anesthesia Post Note  Patient: Joel [name]  Procedure(s) Performed: ERCP, WITH INTERVENTION IF INDICATED  Patient location during evaluation: Endoscopy Anesthesia Type: General Level of consciousness: awake and alert Pain management: pain level controlled Vital Signs Assessment: post-procedure vital signs reviewed and stable Respiratory status: spontaneous breathing, nonlabored ventilation and respiratory function stable Cardiovascular status: blood pressure returned to baseline and stable Postop Assessment: no apparent nausea or vomiting Anesthetic complications: no   No notable events documented.   Last Vitals:  Vitals:   03/25/24 1236 03/25/24 1239  BP: 124/64 116/77  Pulse: 84 81  Resp: 17 15  Temp:    SpO2: 98% 100%    Last Pain:  Vitals:   03/25/24 1216  TempSrc:   PainSc: 0-No pain                 Fairy MARLA Clifton Safley

## 2024-03-25 NOTE — Op Note (Signed)
 [facility]astroenterology Patient Name: Joel [name] Procedure Date: 03/25/2024 11:50 AM MRN: [id number] Account #: [id number] Date of Birth: 01/06/1961 Admit Type: Outpatient Age: 63 Room: [facility] ENDO ROOM 4 Gender: Male Note Status: Finalized Instrument Name: CELINDA [id number] Procedure:             ERCP Indications:           Stent removal Providers:             Rogelia Copping MD, MD Referring MD:          Reyes BIRCH. Auston, MD (Referring MD) Medicines:             Propofol  per Anesthesia Complications:         No immediate complications. Procedure:             Pre-Anesthesia Assessment:                        - Prior to the procedure, a History and Physical was                         performed, and patient medications and allergies were                         reviewed. The patient's tolerance of previous                         anesthesia was also reviewed. The risks and benefits                         of the procedure and the sedation options and risks                         were discussed with the patient. All questions were                         answered, and informed consent was obtained. Prior                         Anticoagulants: The patient has taken no anticoagulant                         or antiplatelet agents. ASA Grade Assessment: II - A                         patient with mild systemic disease. After reviewing                         the risks and benefits, the patient was deemed in                         satisfactory condition to undergo the procedure.                        After obtaining informed consent, the scope was passed                         under direct vision. Throughout the procedure, the  patient's blood pressure, pulse, and oxygen                         saturations were monitored continuously. The                         Duodenoscope was introduced through the mouth, and                          used to inject contrast into and used to inject                         contrast into the bile duct. The ERCP was accomplished                         without difficulty. The patient tolerated the                         procedure well. Findings:      A biliary stent was visible on the scout film. One stent originating in       the common bile duct was emerging from the major papilla. One stent was       removed from the common bile duct using a snare. A wire was passed into       the biliary tree. The bile duct was deeply cannulated with the       short-nosed traction sphincterotome. Contrast was injected. I personally       interpreted the bile duct images. There was brisk flow of contrast       through the ducts. Image quality was excellent. Contrast extended to the       entire biliary tree. The biliary tree was swept with a 15 mm balloon       starting at the bifurcation. Sludge was swept from the duct. All stones       were removed. Nothing was found. Impression:            - One stent from the common bile duct was seen in the                         major papilla.                        - Choledocholithiasis was found. Complete removal was                         accomplished by balloon extraction.                        - One stent was removed from the common bile duct.                        - The biliary tree was swept and nothing was found. Recommendation:        - Discharge patient to home.                        - Resume previous diet.                        - Watch for  pancreatitis, bleeding, perforation, and                         cholangitis.                        - Notify me if the jaundice returns Procedure Code(s):     --- Professional ---                        [phone], Endoscopic retrograde cholangiopancreatography                         (ERCP); with removal of foreign body(s) or stent(s)                         from biliary/pancreatic duct(s)                         43264, Endoscopic retrograde cholangiopancreatography                         (ERCP); with removal of calculi/debris from                         biliary/pancreatic duct(s)                        25671, Endoscopic catheterization of the biliary                         ductal system, radiological supervision and                         interpretation Diagnosis Code(s):     --- Professional ---                        K80.50, Calculus of bile duct without cholangitis or                         cholecystitis without obstruction                        Z46.59, Encounter for fitting and adjustment of other                         gastrointestinal appliance and device CPT copyright 2022 American Medical Association. All rights reserved. The codes documented in this report are preliminary and upon coder review may  be revised to meet current compliance requirements. Rogelia Copping MD, MD 03/25/2024 12:12:38 PM This report has been signed electronically. Number of Addenda: 0 Note Initiated On: 03/25/2024 11:50 AM Estimated Blood Loss:  Estimated blood loss: none.      [facility]

## 2024-03-26 ENCOUNTER — Other Ambulatory Visit: Payer: Self-pay

## 2024-03-27 ENCOUNTER — Other Ambulatory Visit (HOSPITAL_COMMUNITY): Payer: Self-pay

## 2024-03-29 ENCOUNTER — Other Ambulatory Visit: Payer: Self-pay | Admitting: Oncology

## 2024-04-03 ENCOUNTER — Encounter (HOSPITAL_COMMUNITY): Payer: Self-pay

## 2024-04-03 ENCOUNTER — Encounter (HOSPITAL_COMMUNITY)
Admission: RE | Admit: 2024-04-03 | Discharge: 2024-04-03 | Disposition: A | Discharge status: Home / Self Care | Payer: MEDICAID | Source: Ambulatory Visit | Attending: Surgery | Admitting: Surgery

## 2024-04-03 ENCOUNTER — Other Ambulatory Visit: Payer: Self-pay

## 2024-04-03 DIAGNOSIS — Z01818 Encounter for other preprocedural examination: Secondary | ICD-10-CM | POA: Insufficient documentation

## 2024-04-03 DIAGNOSIS — M1990 Unspecified osteoarthritis, unspecified site: Secondary | ICD-10-CM | POA: Insufficient documentation

## 2024-04-03 DIAGNOSIS — E041 Nontoxic single thyroid nodule: Secondary | ICD-10-CM | POA: Insufficient documentation

## 2024-04-03 DIAGNOSIS — I10 Essential (primary) hypertension: Secondary | ICD-10-CM | POA: Insufficient documentation

## 2024-04-03 DIAGNOSIS — J449 Chronic obstructive pulmonary disease, unspecified: Secondary | ICD-10-CM | POA: Insufficient documentation

## 2024-04-03 DIAGNOSIS — I452 Bifascicular block: Secondary | ICD-10-CM | POA: Insufficient documentation

## 2024-04-03 DIAGNOSIS — C8330 Diffuse large B-cell lymphoma, unspecified site: Secondary | ICD-10-CM | POA: Insufficient documentation

## 2024-04-03 DIAGNOSIS — K219 Gastro-esophageal reflux disease without esophagitis: Secondary | ICD-10-CM | POA: Insufficient documentation

## 2024-04-03 DIAGNOSIS — Z8673 Personal history of transient ischemic attack (TIA), and cerebral infarction without residual deficits: Secondary | ICD-10-CM | POA: Insufficient documentation

## 2024-04-03 HISTORY — DX: Personal history of urinary calculi: Z87.442

## 2024-04-03 HISTORY — DX: Diffuse large B-cell lymphoma, unspecified site: C83.30

## 2024-04-03 HISTORY — DX: Unspecified malignant neoplasm of skin, unspecified: C44.90

## 2024-04-03 HISTORY — DX: Unspecified osteoarthritis, unspecified site: M19.90

## 2024-04-03 LAB — BASIC METABOLIC PANEL WITH GFR
Anion gap: 11 (ref 5–15)
BUN: 18 mg/dL (ref 8–23)
CO2: 24 mmol/L (ref 22–32)
Calcium: 9.8 mg/dL (ref 8.9–10.3)
Chloride: 102 mmol/L (ref 98–111)
Creatinine, Ser: 1 mg/dL (ref 0.61–1.24)
GFR, Estimated: >60 mL/min (ref >60)
Glucose, Bld: 94 mg/dL (ref 70–99)
Potassium: 4.4 mmol/L (ref 3.5–5.1)
Sodium: 137 mmol/L (ref 135–145)

## 2024-04-03 LAB — CBC
HCT: 45.5 % (ref 39.0–52.0)
Hemoglobin: 15.2 g/dL (ref 13.0–17.0)
MCH: 31.4 pg (ref 26.0–34.0)
MCHC: 33.4 g/dL (ref 30.0–36.0)
MCV: 94 fL (ref 80.0–100.0)
Platelets: 235 K/uL (ref 150–400)
RBC: 4.84 MIL/uL (ref 4.22–5.81)
RDW: 12.8 % (ref 11.5–15.5)
WBC: 7 K/uL (ref 4.0–10.5)
nRBC: 0 % (ref 0.0–0.2)

## 2024-04-03 NOTE — Patient Instructions (Addendum)
 SURGICAL WAITING ROOM VISITATION Patients having surgery or a procedure may have no more than 2 support people in the waiting area - these visitors may rotate.    Children under the age of 71 must have an adult with them who is not the patient.  If the patient needs to stay at the hospital during part of their recovery, the visitor guidelines for inpatient rooms apply. Pre-op nurse will coordinate an appropriate time for 1 support person to accompany patient in pre-op.  This support person may not rotate.    Please refer to the [facility] website for the visitor guidelines for Inpatients (after your surgery is over and you are in a regular room).       Your procedure is scheduled on: 04-22-24   Report to [facility] Main Entrance    Report to admitting at 5:15 AM   Call this number if you have problems the morning of surgery [phone]   Do not eat food :After Midnight.   After Midnight you may have the following liquids until 4:30 AM DAY OF SURGERY  Water Non-Citrus Juices (without pulp, NO RED-Apple, White grape, White cranberry) Black Coffee (NO MILK/CREAM OR CREAMERS, sugar ok)  Clear Tea (NO MILK/CREAM OR CREAMERS, sugar ok) regular and decaf                             Plain Jell-O (NO RED)                                           Fruit ices (not with fruit pulp, NO RED)                                     Popsicles (NO RED)                                                               Sports drinks like Gatorade (NO RED)                      If you have questions, please contact your surgeon's office.   FOLLOW ANY ADDITIONAL PRE OP INSTRUCTIONS YOU RECEIVED FROM YOUR SURGEON'S OFFICE!!!     Oral Hygiene is also important to reduce your risk of infection.                                    Remember - BRUSH YOUR TEETH THE MORNING OF SURGERY WITH YOUR REGULAR TOOTHPASTE   Do NOT smoke after Midnight   Take these medicines the morning of surgery with A SIP  OF WATER:    Acyclovir    Allopurinol    Alprazolam   Bupropion   Zyrtec  Stop all vitamins and herbal supplements 7 days before surgery                              You may not have any metal on  your body including  jewelry, and body piercing             Do not wear  lotions, powders, cologne, or deodorant              Men may shave face and neck.   Do not bring valuables to the hospital. [facility] IS NOT RESPONSIBLE   FOR VALUABLES.   Contacts, dentures or bridgework may not be worn into surgery.  DO NOT BRING YOUR HOME MEDICATIONS TO THE HOSPITAL. PHARMACY WILL DISPENSE MEDICATIONS LISTED ON YOUR MEDICATION LIST TO YOU DURING YOUR ADMISSION IN THE HOSPITAL!    Patients discharged on the day of surgery will not be allowed to drive home.  Someone NEEDS to stay with you for the first 24 hours after anesthesia.              Please read over the following fact sheets you were given: IF YOU HAVE QUESTIONS ABOUT YOUR PRE-OP INSTRUCTIONS PLEASE CALL [phone] [facility]  If you received a COVID test during your pre-op visit  it is requested that you wear a mask when out in public, stay away from anyone that may not be feeling well and notify your surgeon if you develop symptoms. If you test positive for Covid or have been in contact with anyone that has tested positive in the last 10 days please notify you surgeon.  [facility] - Preparing for Surgery Before surgery, you can play an important role.  Because skin is not sterile, your skin needs to be as free of germs as possible.  You can reduce the number of germs on your skin by washing with CHG (chlorahexidine gluconate) soap before surgery.  CHG is an antiseptic cleaner which kills germs and bonds with the skin to continue killing germs even after washing. Please DO NOT use if you have an allergy to CHG or antibacterial soaps.  If your skin becomes reddened/irritated stop using the CHG and inform your nurse when you arrive at Short Stay. Do  not shave (including legs and underarms) for at least 48 hours prior to the first CHG shower.  You may shave your face/neck.  Please follow these instructions carefully:  1.  Shower with CHG Soap the night before surgery ONLY (DO NOT USE THE SOAP THE MORNING OF SURGERY).  2.  If you choose to wash your hair, wash your hair first as usual with your normal  shampoo.  3.  After you shampoo, rinse your hair and body thoroughly to remove the shampoo.                             4.  Use CHG as you would any other liquid soap.  You can apply chg directly to the skin and wash.  Gently with a scrungie or clean washcloth.  5.  Apply the CHG Soap to your body ONLY FROM THE NECK DOWN.   Do   not use on face/ open                           Wound or open sores. Avoid contact with eyes, ears mouth and   genitals (private parts).                       Wash face,  Genitals (private parts) with your normal soap.  6.  Wash thoroughly, paying special attention to the area where your    surgery  will be performed.  7.  Thoroughly rinse your body with warm water from the neck down.  8.  DO NOT shower/wash with your normal soap after using and rinsing off the CHG Soap.                9.  Pat yourself dry with a clean towel.            10.  Wear clean pajamas.            11.  Place clean sheets on your bed the night of your first shower and do not  sleep with pets. Day of Surgery : Do not apply any CHG, lotions/deodorants the morning of surgery.  Please wear clean clothes to the hospital/surgery center.  FAILURE TO FOLLOW THESE INSTRUCTIONS MAY RESULT IN THE CANCELLATION OF YOUR SURGERY  PATIENT SIGNATURE_________________________________  NURSE SIGNATURE__________________________________  ________________________________________________________________________

## 2024-04-03 NOTE — Progress Notes (Addendum)
 Date of COVID positive in last 90 days:  No  PCP - Reyes Costa, MD Cardiologist - N/A  Chest x-ray - 10-12-23 Epic EKG - 10-13-23 Epic Stress Test - 10-18-22 [facility] ECHO - 10-18-22 [facility] Cardiac Cath - N/A Pacemaker/ICD device last checked:N/A Spinal Cord Stimulator:N/A Port-a-cath  Bowel Prep - N/A  Stop Bang 6 Sleep Study - N/A CPAP -   Fasting Blood Sugar - N/A Checks Blood Sugar _____ times a day  Last dose of GLP1 agonist-  N/A GLP1 instructions:  Do not take after     Last dose of SGLT-2 inhibitors-  N/A SGLT-2 instructions:  Do not take after    Blood Thinner Instructions: N/A Last dose:   Time: Aspirin Instructions:N/A Last Dose:  Activity level:  Can go up a flight of stairs and perform activities of daily living without stopping and without symptoms of chest pain or shortness of breath.  Anesthesia review: COPD, HTN, hx of stroke x2 with no deficits  Finished chemo September for lymphoma.  Patient had temp of 99.4 at preop.  Denied any sick symptoms but stated that he noticed the same temp last week after bilary stent removal.   He will monitor at home.  Patient denies shortness of breath, fever, cough and chest pain at PAT appointment  Patient verbalized understanding of instructions that were given to them at the PAT appointment. Patient was also instructed that they will need to review over the PAT instructions again at home before surgery.

## 2024-04-09 NOTE — Progress Notes (Addendum)
 Case: [id number] Date/Time: 04/22/24 0715   Procedure: LOBECTOMY, THYROID  (Right)   Anesthesia type: General   Pre-op diagnosis: THYROID  NEOPLASAM OF UNCERTAIN BEHAVIOR, RIGHT THYROID  NODULE   Location: [facility] ROOM 01 / [facility]   Surgeons: Eletha Boas, MD       DISCUSSION: Joel [name] is a 63 yo male with PMH of former smoking, Stage IV (Diffuse large B-cell lymphoma), HTN, STOP-BANG score 6, COPD, hx of CVA (no deficits), headache, GERD, arthritis.  Patient underwent MRI abdomen in April 2025 due to development of jaundice. It showed a large necrotic mass involving the central aspect of the liver. Per surgical oncology the mass is unresectable so referred to Oncology for chemo. Patient underwent ERCP on 08/23/2023 which showed a segmental biliary stricture that was malignant appearing and a plastic stent was placed. Liver biopsy was consistent with diffuse large B-cell lymphoma.  Last seen by Oncology on 02/14/24. Patient completed 6 cycles of chemotherapy on 01/31/2024. Post-treatment PET scan showed significant improvement with reduced liver mass and no significant activity, indicating treated lymphoma. Patient had biliary stent removal on 11/17.   Patient also diagnosed with R internal jugular vein thrombosis. He is on Eliquis .    Activity level: Can go up a flight of stairs and perform activities of daily living without stopping and without symptoms of chest pain or shortness of breath.    VS: BP 126/83   Pulse 91   Temp 37.4 C (Oral)   Resp 20   Ht 5' 6.5 (1.689 m)   Wt 99.8 kg   SpO2 95%   BMI 34.98 kg/m   PROVIDERS: Auston Reyes BIRCH, MD   LABS: Labs reviewed: Acceptable for surgery. (all labs ordered are listed, but only abnormal results are displayed)  Labs Reviewed  BASIC METABOLIC PANEL WITH GFR  CBC    PET scan 01/31/24:  IMPRESSION: 1. Continued interval decrease in size and metabolic activity of hepatic mass. Deauville 3 2. No evidence of lymphoma  recurrence on whole-body FDG PET scan. 3. Persistent hypermetabolic nodule in the RIGHT lobe of thyroid  gland. Thyroid  biopsy recommended on ultrasound 11/24/2023  EKG 10/12/23:  Sinus tachycardia, rate 105 Bifascicular block LVH with repolarization abnormality Cannot rule out septal infarct  Stress Echo 10/18/22:  INTERPRETATION Normal Stress Echocardiogram NORMAL RIGHT VENTRICULAR SYSTOLIC FUNCTION TRIVIAL REGURGITATION NOTED (See above) NO VALVULAR STENOSIS NOTED  Past Medical History:  Diagnosis Date   ADD (attention deficit disorder)    Arthritis    Carpal tunnel syndrome, bilateral    COPD (chronic obstructive pulmonary disease) (HCC)    Diffuse large B cell lymphoma (HCC)    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    GERD (gastroesophageal reflux disease)    Headache    History of kidney stones    Hyperlipidemia    Hypertension    Skin cancer    Statin myopathy    Stroke [facility])    Thyroid  nodule     Past Surgical History:  Procedure Laterality Date   COLONOSCOPY N/A 08/18/2023   Procedure: COLONOSCOPY;  Surgeon: Maryruth Ole DASEN, MD;  Location: [facility] ENDOSCOPY;  Service: Endoscopy;  Laterality: N/A;   ERCP N/A 08/23/2023   Procedure: ERCP, WITH INTERVENTION IF INDICATED;  Surgeon: Jinny Carmine, MD;  Location: [facility] ENDOSCOPY;  Service: Endoscopy;  Laterality: N/A;   ERCP N/A 10/03/2023   Procedure: ERCP, WITH INTERVENTION IF INDICATED;  Surgeon: Jinny Carmine, MD;  Location: [facility] ENDOSCOPY;  Service: Endoscopy;  Laterality: N/A;   ERCP N/A 12/25/2023  Procedure: ERCP, WITH INTERVENTION IF INDICATED;  Surgeon: Jinny Carmine, MD;  Location: [facility] ENDOSCOPY;  Service: Endoscopy;  Laterality: N/A;  STENT REMOVAL   ERCP N/A 03/25/2024   Procedure: ERCP, WITH INTERVENTION IF INDICATED;  Surgeon: Jinny Carmine, MD;  Location: [facility] ENDOSCOPY;  Service: Endoscopy;  Laterality: N/A;  STENT REMOVAL   LIVER BIOPSY     MOHS SURGERY     2025, left cheek   POLYPECTOMY   08/18/2023   Procedure: POLYPECTOMY, INTESTINE;  Surgeon: Maryruth Ole DASEN, MD;  Location: [facility] ENDOSCOPY;  Service: Endoscopy;;   PORTACATH PLACEMENT     TONSILLECTOMY     1968    MEDICATIONS:  acyclovir  (ZOVIRAX ) 400 MG tablet   allopurinol  (ZYLOPRIM ) 300 MG tablet   ALPRAZolam (XANAX) 1 MG tablet   APIXABAN  (ELIQUIS ) VTE STARTER PACK (10MG  AND 5MG )   buPROPion (WELLBUTRIN XL) 300 MG 24 hr tablet   cetirizine (ZYRTEC) 10 MG tablet   lactulose  (CHRONULAC ) 10 GM/15ML solution   levofloxacin  (LEVAQUIN ) 500 MG tablet   lidocaine -prilocaine  (EMLA ) cream   Na Sulfate-K Sulfate-Mg Sulfate concentrate (SUPREP) 17.5-3.13-1.6 GM/177ML SOLN   naproxen  (NAPROSYN ) 500 MG tablet   nystatin  (MYCOSTATIN ) 100000 UNIT/ML suspension   ondansetron  (ZOFRAN  ODT) 4 MG disintegrating tablet   ondansetron  (ZOFRAN ) 8 MG tablet   pilocarpine  (SALAGEN ) 5 MG tablet   Polyethyl Glycol-Propyl Glycol (SYSTANE OP)   potassium chloride  SA (KLOR-CON  M) 20 MEQ tablet   prochlorperazine  (COMPAZINE ) 10 MG tablet   telmisartan -hydrochlorothiazide  (MICARDIS  HCT) 80-25 MG tablet   traZODone (DESYREL) 100 MG tablet   No current facility-administered medications for this encounter.   Burnard CHRISTELLA Odis DEVONNA [facility] Short Stay/Anesthesiology [facility] Phone [phone] 04/09/2024 9:16 AM

## 2024-04-09 NOTE — Anesthesia Preprocedure Evaluation (Signed)
 Anesthesia Evaluation  Patient identified by MRN, date of birth, ID band Patient awake    Reviewed: Allergy & Precautions, NPO status , Patient's Chart, lab work & pertinent test results  Airway Mallampati: II  TM Distance: >3 FB Neck ROM: Full    Dental no notable dental hx. (+) Dental Advisory Given, Teeth Intact   Pulmonary sleep apnea , former smoker   Pulmonary exam normal breath sounds clear to auscultation       Cardiovascular hypertension, Normal cardiovascular exam+ dysrhythmias Atrial Fibrillation + pacemaker  Rhythm:Regular Rate:Normal  03/04/2024 EP BI-VENTRICULAR PACEMAKER GENERATOR CHANGE   ECHO 02/29/2024:  1. Overall left ventricular ejection fraction is estimated at 60 to 65%.  2. Normal global left ventricular systolic function.  3. Mild concentric left ventricular hypertrophy.  4. Normal left ventricular diastolic filling.  Left Ventricle:  Overall left ventricular ejection fraction is estimated at 60 to 65%. The left ventricular internal cavity size was normal. LV septal wall thickness was mildly increased. LV posterior wall thickness is mildly increased. Mild concentric left ventricular hypertrophy. Global LV systolic function was normal. Spectral Doppler shows normal pattern of LV diastolic filling. Tissue Doppler indicates an equivocal left ventricular filling pressure.   Right Ventricle:  Normal right ventricular size, wall thickness, and systolic function. Lead seen in the right heart.   Left Atrium:  The left atrium is normal in size.   Right Atrium:  The right atrium is normal in size. The inferior vena cava measures 1.69 cm.   Pericardium:  There is no evidence of pericardial effusion.   Mitral Valve:  Trace mitral valve regurgitation is seen.   Tricuspid Valve:  Mild tricuspid regurgitation is visualized. The tricuspid regurgitant velocity is 2.36 m/s, and with an assumed right atrial  pressure of 3 mmHg, the estimated right ventricular systolic pressure is normal at 25.2 mmHg.   Aortic Valve:  The aortic valve appears trileaflet. No degree of aortic stenosis is present. There is mild aortic valve sclerosis. The peak pressure gradient across the aortic valve is 5.0 mmHg, and the mean pressure gradient is 3.0 mmHg. The calculated AVA is 2.69 cm. No evidence of aortic valve regurgitation is seen.   Pulmonic Valve:  Trace pulmonic valve regurgitation.   Aorta:  The aortic root and ascending aorta appear structurally normal, with no evidence of dilatation.   Venous:  Visualized portions of the inferior vena cava appear normal. The visualized portion of the inferior vena cava size is < 2.1cm in diameter, with respiratory variation greater than 50%.     Neuro/Psych negative neurological ROS     GI/Hepatic negative GI ROS, Neg liver ROS,,,  Endo/Other  negative endocrine ROS    Renal/GU negative Renal ROS     Musculoskeletal  (+) Arthritis ,    Abdominal  (+) + obese  Peds  Hematology negative hematology ROS (+)   Anesthesia Other Findings   Reproductive/Obstetrics                              Anesthesia Physical Anesthesia Plan  ASA: 3  Anesthesia Plan: Spinal   Post-op Pain Management: Tylenol  PO (pre-op)*   Induction: Intravenous  PONV Risk Score and Plan: 2 and Ondansetron , Dexamethasone , Treatment may vary due to age or medical condition, Propofol  infusion and TIVA  Airway Management Planned: Natural Airway  Additional Equipment:   Intra-op Plan:   Post-operative Plan:   Informed Consent: I have reviewed the  patients History and Physical, chart, labs and discussed the procedure including the risks, benefits and alternatives for the proposed anesthesia with the patient or authorized representative who has indicated his/her understanding and acceptance.     Dental advisory given  Plan Discussed with:  CRNA  Anesthesia Plan Comments: (See PAT note from 11/26  Risks of anesthesia explained at length. This includes, but is not limited to, sore throat, damage to teeth, lips gums, tongue and vocal cords, nausea and vomiting, reactions to medications, stroke, heart attack, and death. All patient questions were answered and the patient wishes to proceed. Risks of peripheral nerve block explained at length. This includes, but is not limited to, bleeding, infection, reactions to the medications, seizures, damage to surrounding structures, damage to nerves, permanent weakness, numbness, tingling and pain. All patient questions were answered and patient wishes to proceed with nerve block. Risks of spinal anesthesia explained at length. This includes, but is not limited to, bleeding, infection, reactions to the medications, seizures, headache, damage to surrounding structures, damage to nerves, permanent weakness, numbness, tingling and pain. All patient questions were answered and patient wishes to proceed with spinal anesthesia.  )         Anesthesia Quick Evaluation

## 2024-04-20 ENCOUNTER — Encounter (HOSPITAL_COMMUNITY): Payer: Self-pay | Admitting: Surgery

## 2024-04-20 NOTE — H&P (Signed)
 REFERRING PHYSICIAN: Calton Cohn, PA  PROVIDER: [name], MD  Chief Complaint: New Consultation (Thyroid  neoplasm of uncertain behavior)  History of Present Illness:  Patient is referred by Joel Curtiss Tamea Theophilus, PA, for surgical evaluation and management of a right thyroid  neoplasm of uncertain behavior. Patient's primary care physician is Dr. Reyes [name] at the [place] [facility]. Patient was first noted to have a thyroid  nodule approximately 1 year ago when he was undergoing treatment for skin cancer. Patient apparently underwent additional studies and then attempted fine-needle aspiration biopsy. 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 He underwent PET scanning which showed mild hypermetabolic activity in the thyroid . Patient underwent an ultrasound exam in July 2025. This showed a solitary nodule in the right thyroid  lobe measuring 2.7 x 2.4 x 1.9 cm which corresponded to the mildly hypermetabolic nodule seen on PET scan. Biopsy was recommended. Biopsy demonstrated cytologic atypia. Specimen was submitted for molecular genetic testing with AFIRMA. Results were suspicious, with a risk of malignancy of approximately 50%. There were no DNA mutations detected. Patient is now referred for consideration for surgical resection for definitive diagnosis and management. Patient has no prior history of thyroid  disease. He has never been on thyroid  medication. His most recent TSH level is normal at 4.00. He has had no prior head or neck surgery with the exception of Moh's surgery for skin cancer. There is no family history of thyroid  disease and specifically no family history of thyroid  cancer. Patient is a retired medical illustrator.  Review of Systems: A complete review of systems was obtained from the patient. I have reviewed this information and discussed as appropriate with the patient. See HPI as well for other ROS.  Review of Systems   Constitutional: Negative.  HENT: Negative.  Eyes: Negative.  Respiratory: Negative.  Cardiovascular: Negative.  Gastrointestinal: Negative.  Genitourinary: Negative.  Musculoskeletal: Negative.  Skin: Negative.  Neurological: Negative.  Endo/Heme/Allergies: Negative.  Psychiatric/Behavioral: Negative.    Medical History: Past Medical History:  Diagnosis Date  Abnormal CXR (chest x-ray)  Acute pain of left shoulder due to trauma  Carpal tunnel syndrome, bilateral  Concussion  COPD (chronic obstructive pulmonary disease) (CMS/HHS-HCC)  tobacco abuse  GERD (gastroesophageal reflux disease)  Headache  Hyperlipidemia  Hypertension  Obesity   Patient Active Problem List  Diagnosis  COPD (chronic obstructive pulmonary disease) (CMS/HHS-HCC)  Abnormal CXR (chest x-ray)  Hypertension  Obesity  Carpal tunnel syndrome, bilateral  Acute pain of left shoulder due to trauma  GERD (gastroesophageal reflux disease)  Headache  Hyperlipidemia  Left hip pain  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  ADD (attention deficit disorder)  Right thyroid  nodule  Statin myopathy  Cholangiocarcinoma (CMS/HHS-HCC)  Neoplasm of uncertain behavior of thyroid  gland   Past Surgical History:  Procedure Laterality Date  TONSILLECTOMY 1968  COLONOSCOPY N/A 07/14/2006  Colon @ [facility] 08/18/2023  Three Ta's on colonoscopy. No repeat colonoscopy/CTL    Allergies  Allergen Reactions  Penicillins Unknown  Vicodin [Hydrocodone-Acetaminophen ] Unknown   Current Outpatient Medications on File Prior to Visit  Medication Sig Dispense Refill  allopurinoL  (ZYLOPRIM ) 300 MG tablet Take 300 mg by mouth once daily  ALPRAZolam  (XANAX ) 1 MG tablet Take 1 tablet (1 mg total) by mouth 2 (two) times daily as needed 60 tablet 2  apixaban  5 mg (74 tabs) DsPk Take 5 mg by mouth 2 (two) times daily  buPROPion  (WELLBUTRIN  XL) 300 MG XL tablet TAKE 1 TABLET BY MOUTH EVERY DAY 90 tablet 1  cetirizine (ZYRTEC) 10  MG tablet Take 10 mg by mouth once daily  ezetimibe (ZETIA) 10 mg tablet Take 10 mg by mouth once daily  levoFLOXacin  (LEVAQUIN ) 500 MG tablet Take 500 mg by mouth once daily  omeprazole (PRILOSEC) 40 MG DR capsule Take 1 capsule (40 mg total) by mouth once daily 90 capsule 1  potassium chloride  (KLOR-CON  M20) 20 MEQ ER tablet Take 20 mEq by mouth once daily  predniSONE  (DELTASONE ) 50 MG tablet  telmisartan -hydroCHLOROthiazide  (MICARDIS  HCT) 80-25 mg tablet Take 1 tablet by mouth once daily 90 tablet 1  traZODone  (DESYREL ) 100 MG tablet Take 1 tablet (100 mg total) by mouth at bedtime 90 tablet 3  atomoxetine (STRATTERA) 25 MG capsule TAKE 1 CAPSULE BY MOUTH ONCE DAILY. 90 capsule 1   No current facility-administered medications on file prior to visit.   Family History  Problem Relation Age of Onset  Heart disease Mother  Heart disease Father  Lymphoma Sister    Social History   Tobacco Use  Smoking Status Former  Current packs/day: 0.00  Average packs/day: 1 pack/day for 25.0 years (25.0 ttl pk-yrs)  Types: Cigarettes  Start date: 8  Quit date: 2007  Years since quitting: 18.8  Smokeless Tobacco Former    Social History   Socioeconomic History  Marital status: Divorced  Tobacco Use  Smoking status: Former  Current packs/day: 0.00  Average packs/day: 1 pack/day for 25.0 years (25.0 ttl pk-yrs)  Types: Cigarettes  Start date: 88  Quit date: 2007  Years since quitting: 18.8  Smokeless tobacco: Former  Advertising Account Planner status: Never Used  Substance and Sexual Activity  Alcohol use: Yes  Drug use: Defer  Sexual activity: Defer  Social History Narrative  Education: not listed  Occupation: Airline Pilot rep  Hobbies: golf,hunting  Marital Status: divorced   Social Drivers of Corporate Investment Banker Strain: Low Risk (07/05/2023)  Overall Financial Resource Strain (CARDIA)  Difficulty of Paying Living Expenses: Not hard at all  Food Insecurity: No Food  Insecurity (09/20/2023)  Received from [facility]  Hunger Vital Sign  Within the past 12 months, you worried that your food would run out before you got the money to buy more.: Never true  Within the past 12 months, the food you bought just didn't last and you didn't have money to get more.: Never true  Transportation Needs: No Transportation Needs (07/05/2023)  PRAPARE - Risk Analyst (Medical): No  Lack of Transportation (Non-Medical): No  Housing Stability: Low Risk (07/05/2023)  Housing Stability Vital Sign  Unable to Pay for Housing in the Last Year: No  Number of Times Moved in the Last Year: 0  Homeless in the Last Year: No   Objective:   Vitals:  BP: 125/84  Pulse: (!) 117  Temp: 36.8 C (98.2 F)  TempSrc: Temporal  SpO2: 97%  Weight: (!) 101.4 kg (223 lb 9.6 oz)  Height: 168.9 cm (5' 6.5)  PainSc: 0-No pain   Body mass index is 35.55 kg/m.  Physical Exam   GENERAL APPEARANCE Comfortable, no acute issues Development: normal Gross deformities: none  SKIN Rash, lesions, ulcers: none Induration, erythema: none Nodules: none palpable  EYES Conjunctiva and lids: normal Pupils: equal  EARS, NOSE, MOUTH, THROAT External ears: no lesion or deformity External nose: no lesion or deformity Hearing: grossly normal  NECK Symmetric: yes Trachea: midline Thyroid : Left thyroid  lobe is without palpable abnormality. Palpation of the right thyroid  lobe shows a firm smooth  relatively discrete nodule in the inferior pole just posterior to the head of the right clavicle. This is mobile and nontender. There is no associated lymphadenopathy.  CHEST/CV Not assessed  ABDOMEN Not assessed  GENITOURINARY/RECTAL Not assessed  MUSCULOSKELETAL Station and gait: normal Digits and nails: no clubbing or cyanosis Muscle strength: grossly normal all extremities Deformity: none  LYMPHATIC Cervical: none palpable Supraclavicular: none  palpable  PSYCHIATRIC Oriented to person, place, and time: yes Mood and affect: normal for situation Judgment and insight: appropriate for situation   Assessment and Plan:   Right thyroid  nodule Neoplasm of uncertain behavior of thyroid  gland  Patient is referred by his endocrinologist for surgical evaluation and management of a newly diagnosed thyroid  neoplasm of uncertain behavior.  Patient provided with a copy of The Thyroid  Book: Medical and Surgical Treatment of Thyroid  Problems, published by [employer], 16 pages. Book reviewed and explained to patient during visit today.  Today we reviewed his clinical history. We reviewed his recent imaging studies. We reviewed his cytopathology results as well as the results of molecular genetic testing. We reviewed his recent laboratory studies. Patient has a 2.7 cm nodule in the right thyroid  lobe. Based on molecular genetic testing, he has a 50% risk of malignancy. I have recommended proceeding with right thyroid  lobectomy. Today we discussed the procedure. We discussed total thyroidectomy. We discussed the pros and cons of each procedure. We discussed the risk of surgery including the risk of recurrent laryngeal nerve injury and injury to parathyroid glands. We discussed the size and location of the surgical incision. We discussed the hospital stay to be anticipated. We discussed his postoperative recovery and return to activities. We discussed the potential need for lifelong thyroid  hormone replacement. We discussed the potential need for additional thyroid  surgery and possible treatment with radioactive iodine. The patient understands and wishes to proceed with thyroid  surgery in the near future.  Krystal Spinner, MD [facility] practice Office: [phone]

## 2024-04-22 ENCOUNTER — Encounter (HOSPITAL_COMMUNITY): Payer: Self-pay | Admitting: Surgery

## 2024-04-22 ENCOUNTER — Other Ambulatory Visit: Payer: Self-pay

## 2024-04-22 ENCOUNTER — Encounter (HOSPITAL_COMMUNITY): Admission: RE | Disposition: A | Payer: Self-pay | Source: Home / Self Care | Attending: Surgery

## 2024-04-22 ENCOUNTER — Ambulatory Visit (HOSPITAL_COMMUNITY): Payer: Self-pay | Admitting: Physician Assistant

## 2024-04-22 ENCOUNTER — Ambulatory Visit (HOSPITAL_COMMUNITY): Payer: Self-pay

## 2024-04-22 ENCOUNTER — Ambulatory Visit (HOSPITAL_COMMUNITY)
Admission: RE | Admit: 2024-04-22 | Discharge: 2024-04-23 | Disposition: A | Payer: MEDICAID | Attending: Surgery | Admitting: Surgery

## 2024-04-22 DIAGNOSIS — C8510 Unspecified B-cell lymphoma, unspecified site: Secondary | ICD-10-CM | POA: Insufficient documentation

## 2024-04-22 DIAGNOSIS — Z01818 Encounter for other preprocedural examination: Secondary | ICD-10-CM

## 2024-04-22 DIAGNOSIS — E041 Nontoxic single thyroid nodule: Secondary | ICD-10-CM | POA: Diagnosis present

## 2024-04-22 DIAGNOSIS — K219 Gastro-esophageal reflux disease without esophagitis: Secondary | ICD-10-CM | POA: Insufficient documentation

## 2024-04-22 DIAGNOSIS — D440 Neoplasm of uncertain behavior of thyroid gland: Secondary | ICD-10-CM | POA: Diagnosis present

## 2024-04-22 DIAGNOSIS — J449 Chronic obstructive pulmonary disease, unspecified: Secondary | ICD-10-CM | POA: Insufficient documentation

## 2024-04-22 DIAGNOSIS — Z85828 Personal history of other malignant neoplasm of skin: Secondary | ICD-10-CM | POA: Insufficient documentation

## 2024-04-22 DIAGNOSIS — Z807 Family history of other malignant neoplasms of lymphoid, hematopoietic and related tissues: Secondary | ICD-10-CM | POA: Insufficient documentation

## 2024-04-22 DIAGNOSIS — I10 Essential (primary) hypertension: Secondary | ICD-10-CM | POA: Insufficient documentation

## 2024-04-22 DIAGNOSIS — Z6835 Body mass index (BMI) 35.0-35.9, adult: Secondary | ICD-10-CM | POA: Insufficient documentation

## 2024-04-22 DIAGNOSIS — M1990 Unspecified osteoarthritis, unspecified site: Secondary | ICD-10-CM | POA: Insufficient documentation

## 2024-04-22 DIAGNOSIS — C73 Malignant neoplasm of thyroid gland: Secondary | ICD-10-CM | POA: Insufficient documentation

## 2024-04-22 DIAGNOSIS — E669 Obesity, unspecified: Secondary | ICD-10-CM | POA: Insufficient documentation

## 2024-04-22 DIAGNOSIS — E785 Hyperlipidemia, unspecified: Secondary | ICD-10-CM | POA: Insufficient documentation

## 2024-04-22 DIAGNOSIS — Z8673 Personal history of transient ischemic attack (TIA), and cerebral infarction without residual deficits: Secondary | ICD-10-CM | POA: Insufficient documentation

## 2024-04-22 DIAGNOSIS — Z87891 Personal history of nicotine dependence: Secondary | ICD-10-CM | POA: Insufficient documentation

## 2024-04-22 DIAGNOSIS — Z8249 Family history of ischemic heart disease and other diseases of the circulatory system: Secondary | ICD-10-CM | POA: Insufficient documentation

## 2024-04-22 HISTORY — PX: THYROID LOBECTOMY: SHX420

## 2024-04-22 SURGERY — LOBECTOMY, THYROID
Anesthesia: General | Site: Neck | Laterality: Right

## 2024-04-22 MED ORDER — LACTATED RINGERS IV SOLN: INTRAVENOUS | Status: DC

## 2024-04-22 MED ORDER — SODIUM CHLORIDE 0.45 % IV SOLN: INTRAVENOUS | Status: DC

## 2024-04-22 MED ORDER — PHENYLEPHRINE HCL-NACL 20-0.9 MG/250ML-% IV SOLN
INTRAVENOUS | Status: AC
  Filled 2024-04-22: qty 250

## 2024-04-22 MED ORDER — POTASSIUM CHLORIDE CRYS ER 20 MEQ PO TBCR
20.0000 meq | EXTENDED_RELEASE_TABLET | Freq: Every day | ORAL | Status: DC
  Administered 2024-04-22 – 2024-04-23 (×2): 20 meq via ORAL
  Filled 2024-04-22 (×2): qty 1

## 2024-04-22 MED ORDER — OXYCODONE HCL 5 MG PO TABS
5.0000 mg | ORAL_TABLET | ORAL | Status: DC | PRN
  Administered 2024-04-22 – 2024-04-23 (×3): 5 mg via ORAL
  Filled 2024-04-22 (×2): qty 1

## 2024-04-22 MED ORDER — HYDROMORPHONE HCL 1 MG/ML IJ SOLN
1.0000 mg | INTRAMUSCULAR | Status: DC | PRN
  Administered 2024-04-22: 13:00:00 1 mg via INTRAVENOUS
  Filled 2024-04-22: qty 1

## 2024-04-22 MED ORDER — OXYCODONE HCL 5 MG PO TABS
ORAL_TABLET | ORAL | Status: AC
  Filled 2024-04-22: qty 1

## 2024-04-22 MED ORDER — ONDANSETRON HCL 4 MG/2ML IJ SOLN: 4.0000 mg | Freq: Four times a day (QID) | INTRAMUSCULAR | Status: DC | PRN

## 2024-04-22 MED ORDER — CHLORHEXIDINE GLUCONATE CLOTH 2 % EX PADS: 6.0000 | MEDICATED_PAD | Freq: Once | CUTANEOUS | Status: DC

## 2024-04-22 MED ORDER — LIDOCAINE HCL (CARDIAC) PF 100 MG/5ML IV SOSY
PREFILLED_SYRINGE | INTRAVENOUS | Status: DC | PRN
  Administered 2024-04-22: 07:00:00 65 mg via INTRAVENOUS

## 2024-04-22 MED ORDER — FENTANYL CITRATE (PF) 50 MCG/ML IJ SOSY
PREFILLED_SYRINGE | INTRAMUSCULAR | Status: AC
  Filled 2024-04-22: qty 3

## 2024-04-22 MED ORDER — MIDAZOLAM HCL 5 MG/5ML IJ SOLN
INTRAMUSCULAR | Status: DC | PRN
  Administered 2024-04-22: 07:00:00 2 mg via INTRAVENOUS

## 2024-04-22 MED ORDER — TRAMADOL HCL 50 MG PO TABS
50.0000 mg | ORAL_TABLET | Freq: Four times a day (QID) | ORAL | Status: DC | PRN
  Administered 2024-04-22: 17:00:00 50 mg via ORAL
  Filled 2024-04-22: qty 1

## 2024-04-22 MED ORDER — SUGAMMADEX SODIUM 200 MG/2ML IV SOLN
INTRAVENOUS | Status: DC | PRN
  Administered 2024-04-22: 09:00:00 200 mg via INTRAVENOUS

## 2024-04-22 MED ORDER — FENTANYL CITRATE (PF) 100 MCG/2ML IJ SOLN
INTRAMUSCULAR | Status: AC
  Filled 2024-04-22: qty 2

## 2024-04-22 MED ORDER — CHLORHEXIDINE GLUCONATE 0.12 % MT SOLN
15.0000 mL | Freq: Once | OROMUCOSAL | Status: AC
  Administered 2024-04-22: 06:00:00 15 mL via OROMUCOSAL

## 2024-04-22 MED ORDER — DEXAMETHASONE SOD PHOSPHATE PF 10 MG/ML IJ SOLN
INTRAMUSCULAR | Status: DC | PRN
  Administered 2024-04-22: 08:00:00 10 mg via INTRAVENOUS

## 2024-04-22 MED ORDER — PHENYLEPHRINE HCL (PRESSORS) 10 MG/ML IV SOLN
INTRAVENOUS | Status: DC | PRN
  Administered 2024-04-22 (×2): 80 ug via INTRAVENOUS

## 2024-04-22 MED ORDER — PROPOFOL 10 MG/ML IV BOLUS
INTRAVENOUS | Status: AC
  Filled 2024-04-22: qty 20

## 2024-04-22 MED ORDER — CIPROFLOXACIN IN D5W 400 MG/200ML IV SOLN
400.0000 mg | INTRAVENOUS | Status: AC
  Administered 2024-04-22: 08:00:00 400 mg via INTRAVENOUS
  Filled 2024-04-22: qty 200

## 2024-04-22 MED ORDER — MIDAZOLAM HCL 2 MG/2ML IJ SOLN
INTRAMUSCULAR | Status: AC
  Filled 2024-04-22: qty 2

## 2024-04-22 MED ORDER — 0.9 % SODIUM CHLORIDE (POUR BTL) OPTIME
TOPICAL | Status: DC | PRN
  Administered 2024-04-22: 08:00:00 1000 mL

## 2024-04-22 MED ORDER — ACETAMINOPHEN 325 MG PO TABS: 650.0000 mg | ORAL_TABLET | Freq: Four times a day (QID) | ORAL | Status: DC | PRN

## 2024-04-22 MED ORDER — DROPERIDOL 2.5 MG/ML IJ SOLN: 0.6250 mg | Freq: Once | INTRAMUSCULAR | Status: DC | PRN

## 2024-04-22 MED ORDER — IRBESARTAN 150 MG PO TABS
300.0000 mg | ORAL_TABLET | Freq: Every day | ORAL | Status: DC
  Administered 2024-04-23: 09:00:00 300 mg via ORAL
  Filled 2024-04-22 (×2): qty 2

## 2024-04-22 MED ORDER — ONDANSETRON HCL 4 MG/2ML IJ SOLN
INTRAMUSCULAR | Status: AC
  Filled 2024-04-22: qty 2

## 2024-04-22 MED ORDER — SUGAMMADEX SODIUM 200 MG/2ML IV SOLN: INTRAVENOUS | Status: DC | PRN

## 2024-04-22 MED ORDER — ACETAMINOPHEN 10 MG/ML IV SOLN
1000.0000 mg | Freq: Once | INTRAVENOUS | Status: DC | PRN
  Administered 2024-04-22: 10:00:00 1000 mg via INTRAVENOUS

## 2024-04-22 MED ORDER — ALPRAZOLAM 0.5 MG PO TABS: 1.0000 mg | ORAL_TABLET | Freq: Two times a day (BID) | ORAL | Status: DC | PRN

## 2024-04-22 MED ORDER — ACETAMINOPHEN 650 MG RE SUPP: 650.0000 mg | Freq: Four times a day (QID) | RECTAL | Status: DC | PRN

## 2024-04-22 MED ORDER — HYDROCHLOROTHIAZIDE 25 MG PO TABS
25.0000 mg | ORAL_TABLET | Freq: Every day | ORAL | Status: DC
  Administered 2024-04-22 – 2024-04-23 (×2): 25 mg via ORAL
  Filled 2024-04-22 (×2): qty 1

## 2024-04-22 MED ORDER — PHENYLEPHRINE 80 MCG/ML (10ML) SYRINGE FOR IV PUSH (FOR BLOOD PRESSURE SUPPORT)
PREFILLED_SYRINGE | INTRAVENOUS | Status: AC
  Filled 2024-04-22: qty 10

## 2024-04-22 MED ORDER — FENTANYL CITRATE (PF) 100 MCG/2ML IJ SOLN
INTRAMUSCULAR | Status: DC | PRN
  Administered 2024-04-22 (×2): 50 ug via INTRAVENOUS
  Administered 2024-04-22: 07:00:00 100 ug via INTRAVENOUS

## 2024-04-22 MED ORDER — ROCURONIUM BROMIDE 10 MG/ML (PF) SYRINGE
PREFILLED_SYRINGE | INTRAVENOUS | Status: AC
  Filled 2024-04-22: qty 10

## 2024-04-22 MED ORDER — PROPOFOL 10 MG/ML IV BOLUS
INTRAVENOUS | Status: DC | PRN
  Administered 2024-04-22: 07:00:00 170 mg via INTRAVENOUS

## 2024-04-22 MED ORDER — LACTATED RINGERS IV SOLN: INTRAVENOUS | Status: DC | PRN

## 2024-04-22 MED ORDER — ONDANSETRON HCL 4 MG/2ML IJ SOLN
INTRAMUSCULAR | Status: DC | PRN
  Administered 2024-04-22: 09:00:00 4 mg via INTRAVENOUS

## 2024-04-22 MED ORDER — ARTIFICIAL TEARS OPHTHALMIC OINT
TOPICAL_OINTMENT | OPHTHALMIC | Status: AC
  Filled 2024-04-22: qty 3.5

## 2024-04-22 MED ORDER — HEMOSTATIC AGENTS (NO CHARGE) OPTIME
TOPICAL | Status: DC | PRN
  Administered 2024-04-22: 09:00:00 1 via TOPICAL

## 2024-04-22 MED ORDER — ORAL CARE MOUTH RINSE: 15.0000 mL | Freq: Once | OROMUCOSAL | Status: AC

## 2024-04-22 MED ORDER — TRAZODONE HCL 100 MG PO TABS
100.0000 mg | ORAL_TABLET | Freq: Every day | ORAL | Status: DC
  Administered 2024-04-22: 21:00:00 100 mg via ORAL
  Filled 2024-04-22: qty 1

## 2024-04-22 MED ORDER — ALLOPURINOL 300 MG PO TABS
300.0000 mg | ORAL_TABLET | Freq: Every day | ORAL | Status: DC
  Administered 2024-04-23: 09:00:00 300 mg via ORAL
  Filled 2024-04-22: qty 1

## 2024-04-22 MED ORDER — ACETAMINOPHEN 10 MG/ML IV SOLN
INTRAVENOUS | Status: AC
  Filled 2024-04-22: qty 100

## 2024-04-22 MED ORDER — LIDOCAINE HCL (PF) 2 % IJ SOLN
INTRAMUSCULAR | Status: AC
  Filled 2024-04-22: qty 5

## 2024-04-22 MED ORDER — ROCURONIUM BROMIDE 100 MG/10ML IV SOLN
INTRAVENOUS | Status: DC | PRN
  Administered 2024-04-22: 09:00:00 10 mg via INTRAVENOUS
  Administered 2024-04-22: 07:00:00 60 mg via INTRAVENOUS

## 2024-04-22 MED ORDER — FENTANYL CITRATE (PF) 50 MCG/ML IJ SOSY
25.0000 ug | PREFILLED_SYRINGE | INTRAMUSCULAR | Status: DC | PRN
  Administered 2024-04-22 (×3): 50 ug via INTRAVENOUS

## 2024-04-22 MED ORDER — ONDANSETRON 4 MG PO TBDP: 4.0000 mg | ORAL_TABLET | Freq: Four times a day (QID) | ORAL | Status: DC | PRN

## 2024-04-22 MED ORDER — TELMISARTAN-HCTZ 80-25 MG PO TABS: 1.0000 | ORAL_TABLET | Freq: Every day | ORAL | Status: DC

## 2024-04-22 MED ORDER — BUPROPION HCL ER (XL) 150 MG PO TB24
300.0000 mg | ORAL_TABLET | Freq: Every day | ORAL | Status: DC
  Administered 2024-04-22 – 2024-04-23 (×2): 300 mg via ORAL
  Filled 2024-04-22 (×2): qty 2

## 2024-04-22 SURGICAL SUPPLY — 27 items
BAG COUNTER SPONGE SURGICOUNT (BAG) ×1 IMPLANT
BLADE SURG 15 STRL LF DISP TIS (BLADE) ×1 IMPLANT
CHLORAPREP W/TINT 26 (MISCELLANEOUS) ×1 IMPLANT
CLIP TI MEDIUM 6 (CLIP) ×2 IMPLANT
CLIP TI WIDE RED SMALL 6 (CLIP) ×2 IMPLANT
COVER SURGICAL LIGHT HANDLE (MISCELLANEOUS) ×1 IMPLANT
DERMABOND ADVANCED .7 DNX12 (GAUZE/BANDAGES/DRESSINGS) ×1 IMPLANT
DRAPE LAPAROTOMY T 98X78 PEDS (DRAPES) ×1 IMPLANT
DRAPE UTILITY XL STRL (DRAPES) ×1 IMPLANT
ELECT PENCIL ROCKER SW 15FT (MISCELLANEOUS) ×1 IMPLANT
ELECT REM PT RETURN 15FT ADLT (MISCELLANEOUS) ×1 IMPLANT
GAUZE 4X4 16PLY ~~LOC~~+RFID DBL (SPONGE) ×1 IMPLANT
GLOVE SURG ORTHO 8.0 STRL STRW (GLOVE) ×1 IMPLANT
GOWN STRL REUS W/ TWL XL LVL3 (GOWN DISPOSABLE) ×2 IMPLANT
HEMOSTAT SURGICEL 2X4 FIBR (HEMOSTASIS) ×1 IMPLANT
ILLUMINATOR WAVEGUIDE N/F (MISCELLANEOUS) ×1 IMPLANT
KIT BASIN OR (CUSTOM PROCEDURE TRAY) ×1 IMPLANT
KIT TURNOVER KIT A (KITS) ×1 IMPLANT
PACK BASIC VI WITH GOWN DISP (CUSTOM PROCEDURE TRAY) ×1 IMPLANT
PAD MAGNETIC INSTR ST 16X20 (MISCELLANEOUS) ×1 IMPLANT
SHEARS HARMONIC 9CM CVD (BLADE) ×1 IMPLANT
SUT MNCRL AB 4-0 PS2 18 (SUTURE) ×1 IMPLANT
SUT SILK 3 0 SH 30 (SUTURE) ×1 IMPLANT
SUT VIC AB 3-0 SH 18 (SUTURE) ×2 IMPLANT
SYR BULB IRRIG 60ML STRL (SYRINGE) ×1 IMPLANT
TOWEL OR DSP ST BLU DLX 10/PK (DISPOSABLE) ×1 IMPLANT
TUBING CONNECTING 10 (TUBING) ×1 IMPLANT

## 2024-04-22 NOTE — Anesthesia Postprocedure Evaluation (Signed)
 Anesthesia Post Note  Patient: Joel [name]  Procedure(s) Performed: LOBECTOMY, THYROID  (Right: Neck)     Patient location during evaluation: PACU Anesthesia Type: General Level of consciousness: awake and alert Pain management: pain level controlled Vital Signs Assessment: post-procedure vital signs reviewed and stable Respiratory status: spontaneous breathing, nonlabored ventilation, respiratory function stable and patient connected to nasal cannula oxygen Cardiovascular status: blood pressure returned to baseline and stable Postop Assessment: no apparent nausea or vomiting Anesthetic complications: no   No notable events documented.  Last Vitals:  Vitals:   04/22/24 1257 04/22/24 1330  BP: (!) 148/90 (!) 144/89  Pulse: 80 83  Resp: 16 16  Temp: 36.5 C 36.6 C  SpO2: 99% 96%    Last Pain:  Vitals:   04/22/24 1345  TempSrc:   PainSc: 3                  Rayen Palen P Berthel Bagnall

## 2024-04-22 NOTE — Transfer of Care (Signed)
 Immediate Anesthesia Transfer of Care Note  Patient: Joel [name]  Procedure(s) Performed: LOBECTOMY, THYROID  (Right: Neck)  Patient Location: PACU  Anesthesia Type:General  Level of Consciousness: awake and drowsy  Airway & Oxygen Therapy: Patient Spontanous Breathing and Patient connected to face mask oxygen  Post-op Assessment: Report given to RN and Post -op Vital signs reviewed and stable  Post vital signs: Reviewed and stable  Last Vitals:  Vitals Value Taken Time  BP 159/96 04/22/24 09:15  Temp    Pulse 84 04/22/24 09:18  Resp 16 04/22/24 09:18  SpO2 100 % 04/22/24 09:18  Vitals shown include unfiled device data.  Last Pain:  Vitals:   04/22/24 0600  TempSrc: Oral  PainSc: 0-No pain      Patients Stated Pain Goal: 5 (04/22/24 0600)  Complications: No notable events documented.

## 2024-04-22 NOTE — Op Note (Signed)
 Procedure Note  Pre-operative Diagnosis:  right thyroid  nodule, thyroid  neoplasm of uncertain behavior  Post-operative Diagnosis:  same  Surgeon:  Krystal Spinner, MD  Assistant:  Marolyn Big, MD ([facility] Resident)   Procedure:  Right thyroid  lobectomy and isthmusectomy  Anesthesia:  General  Estimated Blood Loss:  10 cc  Drains: none         Specimen: thyroid  lobe to pathology  Indications:  Patient is referred by Calton Curtiss Tamea Theophilus, PA, for surgical evaluation and management of a right thyroid  neoplasm of uncertain behavior. Patient's primary care physician is Dr. Reyes Costa at the [facility]. Patient was first noted to have a thyroid  nodule approximately 1 year ago when he was undergoing treatment for skin cancer. Patient apparently underwent additional studies and then attempted fine-needle aspiration biopsy. 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 He underwent PET scanning which showed mild hypermetabolic activity in the thyroid . Patient underwent an ultrasound exam in July 2025. This showed a solitary nodule in the right thyroid  lobe measuring 2.7 x 2.4 x 1.9 cm which corresponded to the mildly hypermetabolic nodule seen on PET scan. Biopsy was recommended. Biopsy demonstrated cytologic atypia. Specimen was submitted for molecular genetic testing with AFIRMA. Results were suspicious, with a risk of malignancy of approximately 50%. There were no DNA mutations detected. Patient is now referred for consideration for surgical resection for definitive diagnosis and management. Patient has no prior history of thyroid  disease. He has never been on thyroid  medication. His most recent TSH level is normal at 4.00. He has had no prior head or neck surgery with the exception of Moh's surgery for skin cancer. There is no family history of thyroid  disease and specifically no family history of thyroid  cancer. Patient is a retired medical illustrator.   Procedure Details:  Procedure was done in OR #1 at the [facility]. The patient was brought to the operating room and placed in a supine position on the operating room table. Following administration of general anesthesia, the patient was positioned and then prepped and draped in the usual aseptic fashion. After ascertaining that an adequate level of anesthesia had been achieved, a small Kocher incision was made with #15 blade. Dissection was carried through subcutaneous tissues and platysma. Hemostasis was achieved with the electrocautery. Skin flaps were elevated cephalad and caudad from the thyroid  notch to the sternal notch. A self-retaining retractor was placed for exposure. Strap muscles were incised in the midline and dissection was begun on the right side. Strap muscles were reflected laterally. The right thyroid  lobe was normal in size with a solitary dominant nodule in the inferior pole. The lobe was gently mobilized with blunt dissection. Superior pole vessels were dissected out and divided individually between small and medium ligaclips with the harmonic scalpel. The thyroid  lobe was rolled anteriorly. Branches of the inferior thyroid  artery were divided between small ligaclips with the harmonic scalpel. Inferior venous tributaries were divided between ligaclips. Both the superior and inferior parathyroid glands were identified and preserved on their vascular pedicles. The recurrent laryngeal nerve was identified and preserved along its course. The ligament of Court was released with the electrocautery and the gland was mobilized onto the anterior trachea. Isthmus was mobilized across the midline. There was no significant pyramidal lobe present. The thyroid  parenchyma was transected at the junction of the isthmus and contralateral thyroid  lobe with the harmonic scalpel. A suture was used to mark the isthmus margin. The thyroid  lobe and isthmus were submitted to pathology for review.  The entire field was palpated  for evidence of lymphadenopathy or extra-thyroidal disease.  No worrisome findings were noted.  No enlarged lymph nodes were identified.  The neck was irrigated with warm saline. Fibrillar was placed throughout the operative field. Strap muscles were approximated in the midline with interrupted 3-0 Vicryl sutures. Platysma was closed with interrupted 3-0 Vicryl sutures. Skin was closed with a running 4-0 Monocryl subcuticular suture.  Wound was washed and dried and Dermabond was applied. The patient was awakened from anesthesia and brought to the recovery room. The patient tolerated the procedure well.   Krystal Spinner, MD [facility] Surgery Office: [phone]

## 2024-04-22 NOTE — Anesthesia Procedure Notes (Addendum)
 Procedure Name: Intubation Date/Time: 04/22/2024 7:32 AM  Performed by: Franchot Delon RAMAN, CRNAPre-anesthesia Checklist: Patient identified, Emergency Drugs available, Suction available and Patient being monitored Patient Re-evaluated:Patient Re-evaluated prior to induction Oxygen Delivery Method: Circle System Utilized Preoxygenation: Pre-oxygenation with 100% oxygen Induction Type: IV induction Ventilation: Oral airway inserted - appropriate to patient size and Two handed mask ventilation required Laryngoscope Size: Mac and 4 Grade View: Grade I Tube type: Oral Tube size: 7.5 mm Number of attempts: 1 Airway Equipment and Method: Stylet and Oral airway Placement Confirmation: ETT inserted through vocal cords under direct vision, positive ETCO2 and breath sounds checked- equal and bilateral Secured at: 22 cm Tube secured with: Tape Dental Injury: Teeth and Oropharynx as per pre-operative assessment  Comments: ETT placed by Madelaine KET with direct supervision by [employer]

## 2024-04-22 NOTE — Interval H&P Note (Signed)
 History and Physical Interval Note:  04/22/2024 7:07 AM  Joel [name]  has presented today for surgery, with the diagnosis of THYROID  NEOPLASAM OF UNCERTAIN BEHAVIOR, RIGHT THYROID  NODULE.  The various methods of treatment have been discussed with the patient and family. After consideration of risks, benefits and other options for treatment, the patient has consented to    Procedures: LOBECTOMY, THYROID  (Right) as a surgical intervention.    The patient's history has been reviewed, patient examined, no change in status, stable for surgery.  I have reviewed the patient's chart and labs.  Questions were answered to the patient's satisfaction.    Krystal Spinner, MD [facility] practice Office: [phone]   Krystal Spinner

## 2024-04-23 ENCOUNTER — Encounter (HOSPITAL_COMMUNITY): Payer: Self-pay | Admitting: Surgery

## 2024-04-23 ENCOUNTER — Ambulatory Visit: Payer: Self-pay | Admitting: Surgery

## 2024-04-23 LAB — SURGICAL PATHOLOGY

## 2024-04-23 NOTE — Discharge Instructions (Signed)

## 2024-04-23 NOTE — Discharge Summary (Signed)
 Physician Discharge Summary   Patient ID: Joel [name] MRN: [id number] DOB/AGE: 07-17-1960 63 y.o.  Admit date: 04/22/2024  Discharge date: 04/23/2024  Discharge Diagnoses:  Principal Problem:   Neoplasm of uncertain behavior of thyroid  gland Active Problems:   Thyroid  nodule   Discharged Condition: good  Hospital Course: Patient was admitted for observation following thyroid  surgery.  Post op course was uncomplicated.  Pain was well controlled.  Tolerated diet.  Patient was prepared for discharge home on POD#1.  Consults: None  Treatments: surgery: left thyroid  lobectomy  Discharge Exam: Blood pressure (!) 133/94, pulse 84, temperature 98.5 F (36.9 C), temperature source Oral, resp. rate 18, height 5' 6.5 (1.689 m), weight 99.3 kg, SpO2 100%. HEENT - clear Neck - wound dry and intact; mild STS; Dermabond in place; voice normal  Disposition: Home  Discharge Instructions     Increase activity slowly   Complete by: As directed    No dressing needed   Complete by: As directed       Allergies as of 04/23/2024       Reactions   Penicillins Other (See Comments)   Childhood allergy; causes high temp 104-105   Hydrocodone-acetaminophen  Itching, Rash        Medication List     TAKE these medications    acyclovir  400 MG tablet Commonly known as: ZOVIRAX  TAKE 1 TABLET BY MOUTH EVERY DAY   allopurinol  300 MG tablet Commonly known as: ZYLOPRIM  TAKE 1 TABLET BY MOUTH EVERY DAY   ALPRAZolam  1 MG tablet Commonly known as: XANAX  Take 1 mg by mouth 2 (two) times daily as needed for anxiety.   Apixaban  Starter Pack (10mg  and 5mg ) Commonly known as: ELIQUIS  STARTER PACK Take as directed on package: start with two-5mg  tablets twice daily for 7 days. On day 8, switch to one-5mg  tablet twice daily.   buPROPion  300 MG 24 hr tablet Commonly known as: WELLBUTRIN  XL Take 300 mg by mouth daily.   calcium-vitamin D 500-5 MG-MCG tablet Commonly known as: OSCAL  WITH D Take 1 tablet by mouth daily.   cetirizine 10 MG tablet Commonly known as: ZYRTEC Take 10 mg by mouth daily.   lactulose  10 GM/15ML solution Commonly known as: CHRONULAC  TAKE 15 MLS (10 G TOTAL) BY MOUTH AS NEEDED FOR MODERATE CONSTIPATION.   levofloxacin  500 MG tablet Commonly known as: LEVAQUIN  Take 1 tablet (500 mg total) by mouth daily.   lidocaine -prilocaine  cream Commonly known as: EMLA  Apply to affected area once   MULTIVITAMIN ADULT PO Take 1 tablet by mouth daily.   Na Sulfate-K Sulfate-Mg Sulfate concentrate 17.5-3.13-1.6 GM/177ML Soln Commonly known as: SUPREP Take 354 mLs by mouth as directed.   naproxen  500 MG tablet Commonly known as: Naprosyn  Take 1 tablet (500 mg total) by mouth 2 (two) times daily with a meal.   nystatin  100000 UNIT/ML suspension Commonly known as: MYCOSTATIN  Take 5 mLs (500,000 Units total) by mouth 4 (four) times daily.   ondansetron  4 MG disintegrating tablet Commonly known as: Zofran  ODT Take 1 tablet (4 mg total) by mouth every 8 (eight) hours as needed for nausea or vomiting.   ondansetron  8 MG tablet Commonly known as: Zofran  Take 1 tablet (8 mg total) by mouth every 8 (eight) hours as needed for nausea or vomiting. Start on the third day after cyclophosphamide  chemotherapy.   pilocarpine  5 MG tablet Commonly known as: SALAGEN  TAKE 1 TABLET BY MOUTH THREE TIMES A DAY   potassium chloride  SA 20 MEQ  tablet Commonly known as: KLOR-CON  M TAKE 1 TABLET BY MOUTH EVERY DAY   prochlorperazine  10 MG tablet Commonly known as: COMPAZINE  Take 1 tablet (10 mg total) by mouth every 6 (six) hours as needed for nausea or vomiting.   SYSTANE OP Place 1 drop into both eyes as needed (dry eyes).   telmisartan -hydrochlorothiazide  80-25 MG tablet Commonly known as: MICARDIS  HCT Take 1 tablet by mouth daily.   traZODone  100 MG tablet Commonly known as: DESYREL  Take 100 mg by mouth at bedtime.               Discharge  Care Instructions  (From admission, onward)           Start     Ordered   04/23/24 0000  No dressing needed        04/23/24 9091            Follow-up Information     Eletha Boas, MD. Schedule an appointment as soon as possible for a visit in 3 week(s).   Specialty: General Surgery Why: For wound re-check Contact information: [address] [place] [address] [phone]                 Boas Eletha, MD [facility] Office: [phone]   Signed: Boas Eletha 04/23/2024, 9:08 AM

## 2024-04-23 NOTE — Plan of Care (Signed)
   Problem: Clinical Measurements: Goal: Will remain free from infection Outcome: Progressing Goal: Diagnostic test results will improve Outcome: Progressing

## 2024-04-23 NOTE — Progress Notes (Signed)
 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  Will send to patient's endocrinologist, Dr. Solum, for her opinion regarding whether or not additional surgery is required at this time.  Essentially the options are for continued observation with USN exam in 6 months with labs and exam, versus proceeding with completion thyroidectomy (removing the other thyroid  lobe).  I would like Dr. Brigid input in making this decision.  Krystal Spinner, MD [facility] practice Office: [phone]

## 2024-04-24 ENCOUNTER — Encounter: Payer: Self-pay | Admitting: Oncology

## 2024-05-20 ENCOUNTER — Inpatient Hospital Stay (HOSPITAL_BASED_OUTPATIENT_CLINIC_OR_DEPARTMENT_OTHER): Payer: Self-pay | Admitting: Oncology

## 2024-05-20 ENCOUNTER — Encounter: Payer: Self-pay | Admitting: Oncology

## 2024-05-20 ENCOUNTER — Inpatient Hospital Stay: Payer: Self-pay | Attending: Oncology

## 2024-05-20 VITALS — BP 125/92 | HR 71 | Temp 98.7°F | Resp 19 | Ht 66.5 in | Wt 231.0 lb

## 2024-05-20 DIAGNOSIS — G620 Drug-induced polyneuropathy: Secondary | ICD-10-CM | POA: Insufficient documentation

## 2024-05-20 DIAGNOSIS — Z8579 Personal history of other malignant neoplasms of lymphoid, hematopoietic and related tissues: Secondary | ICD-10-CM

## 2024-05-20 DIAGNOSIS — C833A Diffuse large b-cell lymphoma, in remission: Secondary | ICD-10-CM | POA: Insufficient documentation

## 2024-05-20 DIAGNOSIS — T451X5A Adverse effect of antineoplastic and immunosuppressive drugs, initial encounter: Secondary | ICD-10-CM | POA: Insufficient documentation

## 2024-05-20 DIAGNOSIS — R7989 Other specified abnormal findings of blood chemistry: Secondary | ICD-10-CM | POA: Insufficient documentation

## 2024-05-20 DIAGNOSIS — Z08 Encounter for follow-up examination after completed treatment for malignant neoplasm: Secondary | ICD-10-CM

## 2024-05-20 LAB — CBC WITH DIFFERENTIAL (CANCER CENTER ONLY)
Abs Immature Granulocytes: 0.02 K/uL (ref 0.00–0.07)
Basophils Absolute: 0.1 K/uL (ref 0.0–0.1)
Basophils Relative: 1 %
Eosinophils Absolute: 0.4 K/uL (ref 0.0–0.5)
Eosinophils Relative: 5 %
HCT: 44 % (ref 39.0–52.0)
Hemoglobin: 15.3 g/dL (ref 13.0–17.0)
Immature Granulocytes: 0 %
Lymphocytes Relative: 18 %
Lymphs Abs: 1.2 K/uL (ref 0.7–4.0)
MCH: 31 pg (ref 26.0–34.0)
MCHC: 34.8 g/dL (ref 30.0–36.0)
MCV: 89.2 fL (ref 80.0–100.0)
Monocytes Absolute: 0.7 K/uL (ref 0.1–1.0)
Monocytes Relative: 10 %
Neutro Abs: 4.6 K/uL (ref 1.7–7.7)
Neutrophils Relative %: 66 %
Platelet Count: 198 K/uL (ref 150–400)
RBC: 4.93 MIL/uL (ref 4.22–5.81)
RDW: 13.5 % (ref 11.5–15.5)
WBC Count: 6.9 K/uL (ref 4.0–10.5)
nRBC: 0 % (ref 0.0–0.2)

## 2024-05-20 LAB — CMP (CANCER CENTER ONLY)
ALT: 49 U/L — ABNORMAL HIGH (ref 0–44)
AST: 43 U/L — ABNORMAL HIGH (ref 15–41)
Albumin: 4.3 g/dL (ref 3.5–5.0)
Alkaline Phosphatase: 162 U/L — ABNORMAL HIGH (ref 38–126)
Anion gap: 10 (ref 5–15)
BUN: 19 mg/dL (ref 8–23)
CO2: 27 mmol/L (ref 22–32)
Calcium: 9.8 mg/dL (ref 8.9–10.3)
Chloride: 99 mmol/L (ref 98–111)
Creatinine: 1 mg/dL (ref 0.61–1.24)
GFR, Estimated: >60 mL/min
Glucose, Bld: 106 mg/dL — ABNORMAL HIGH (ref 70–99)
Potassium: 3.5 mmol/L (ref 3.5–5.1)
Sodium: 136 mmol/L (ref 135–145)
Total Bilirubin: 0.3 mg/dL (ref 0.0–1.2)
Total Protein: 6.4 g/dL — ABNORMAL LOW (ref 6.5–8.1)

## 2024-05-20 LAB — MISCELLANEOUS TEST

## 2024-05-20 LAB — LACTATE DEHYDROGENASE: LDH: 181 U/L (ref 105–235)

## 2024-05-20 MED ORDER — LACTULOSE 10 GM/15ML PO SOLN: 10.0000 g | ORAL | 1 refills | Status: AC | PRN

## 2024-05-20 NOTE — Progress Notes (Signed)
 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  On the day of his colonoscopy patient noticed that he was jaundiced and therefore underwent labs which showed bilirubin of 14 on his CMP AST ALT of 70 and 108 and alkaline phosphatase of 246 respectively.  This was followed by MRI abdomen with MRCP with and without contrast which showed a large complex partially necrotic mass involving the central aspect of the liver.  Mass is involving segment 4A, segment 8 and the caudate lobe.  Also surrounds the gallbladder and is obstructing the biliary tree centrally near the confluence of the right and left hepatic ducts.  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  Compression and possible obstruction of the left portal vein.  The main portal vein is patent and the middle and right portal veins are patent.  There is tumor adjacent to and compressing the intrahepatic IVC but no evidence of occlusion.  Gastrohepatic ligament and celiac axis and periportal nodes concerning for metastatic disease.  Patient underwent ERCP by Dr.  Edith on 08/23/2023 which showed a segmental biliary stricture that was malignant appearing and a plastic stent was placed.  Cells for cytology were obtained which was consistent with atypical glandular cells but not diagnostic for malignancy.   Patient was seen by Dr. Romero from pancreaticobiliary surgery at [facility] and was not deemed to be a surgical candidate.  He was then seen by Dr. Donato from medical oncology who discussed the possibility that this could be locally advanced cholangiocarcinoma and discussed possible treatment options for the same.  However since patient did not have definitive tissue diagnosis liver biopsy was recommended.  He had a port placed in the meanwhile.  Liver biopsy surprisingly did not show any evidence of cholangiocarcinoma but was consistent with diffuse large B-cell lymphoma.   Diffuse large B cell lymphoma (DLBCL), germinal center B-cell like (GCB) subtype. See comment.   Comment: Histologic sections show needle core shaped tissue with diffuse infiltrate of large atypical lymphoid cells.  No normal liver parenchyma is identified.  The immunophenotype of the large atypical lymphoid cells are consistent with DLBCL GCB subtype per Mikle algorithm.  Staining for MYC and EBER was negative.83% of nuclei showed rearrangement of the MYC (8q24) locus by interphase FISH; 44-54% of nuclei showed additional, intact copies of the BCL6 locus (3q27) and BCL2 loci. No evidence of BCL2 rearrangement, BCL6 rearrangement, or IGH::MYC rearrangement was observed. MYC rearrangement in the absence of BCL2 or BCL6 gene rearrangements, is a nonspecific finding and may be seen in Burkitt lymphoma (BL) or diffuse large B-cell lymphoma, not otherwise specified (DLBCL, NOS).       Summary Table  Locus Result  MYC (8q24) 83%  with MYC rearrangement  BCL2 (18q21) 54% with 3 intact copies No evidence of BCL2 rearrangement   BCL6 (3q27) 44% with 3 intact copies No evidence of BCL6 rearrangement        PET CT scan showed multiple hypermetabolic mass in the liver and tracer avid abdominal lymph nodes compatible with lymphoma.  There was a thyroid  nodule 2.1 cm with an SUV of 6.6 and thyroid  ultrasound was recommended for the same.  Baseline echocardiogram normal.  Given abnormal LFTs and GCB subtype plan is to proceed with R-CHOP chemotherapy instead of R Pola CHP chemotherapy.  IPI score 2-3 low- high intemediate risk (age greater than 60 and stage IV disease.  Extranodal sites-?2-given liver/contiguous gallbladder/ bile duct involvement).  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  A surveillance PET scan in September showed no evidence of disease, and serial blood-based cologuard testing has been negative for residual lymphoma. A port remains in place and is maintained with regular flushes every two months; discussion regarding removal was deferred.  He is status post right thyroid  lobectomy for minimally invasive Hrthle cell carcinoma, confirmed by pathology. He has not received radioactive iodine or chemotherapy for the thyroid  malignancy. He has not yet met with endocrinology for further management and has a follow-up appointment scheduled in March. He is not currently on thyroid  hormone replacement therapy.  He describes a constant sensation primarily in the left hand, especially on the dorsal aspect, since chemotherapy.  He initially presented with jaundice. Recent laboratory studies revealed mildly elevated liver enzymes.  He expressed fatigue and frustration regarding the frequency of medical procedures and surgeries over the past year, and is hopeful for fewer interventions moving forward.      ECOG PS- 1 Pain scale-  0   Review of systems- Review of Systems  Constitutional:  Negative for chills, fever, malaise/fatigue and weight loss.  HENT:  Negative for congestion, ear discharge and nosebleeds.   Eyes:  Negative for blurred vision.  Respiratory:  Negative for cough, hemoptysis, sputum production, shortness of breath and wheezing.   Cardiovascular:  Negative for chest pain, palpitations, orthopnea and claudication.  Gastrointestinal:  Negative for abdominal pain, blood in stool, constipation, diarrhea, heartburn, melena, nausea and vomiting.  Genitourinary:  Negative for dysuria, flank pain, frequency, hematuria and urgency.  Musculoskeletal:  Negative for back pain, joint pain and myalgias.  Skin:  Negative for rash.  Neurological:  Positive for sensory change (Peripheral neuropathy). Negative for dizziness, tingling, focal weakness, seizures, weakness and headaches.  Endo/Heme/Allergies:  Does not bruise/bleed easily.  Psychiatric/Behavioral:  Negative for depression and suicidal ideas. The patient does not have insomnia.       Allergies[1]   Past Medical History:  Diagnosis Date   ADD (attention deficit disorder)    Arthritis    Carpal tunnel syndrome, bilateral    COPD (chronic obstructive pulmonary disease) (HCC)    Diffuse large B cell lymphoma (HCC)    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    GERD (gastroesophageal reflux disease)    Headache    History of kidney stones    Hyperlipidemia    Hypertension    Skin cancer    Statin myopathy    Stroke (HCC)    Thyroid  nodule      Past  Surgical History:  Procedure Laterality Date   COLONOSCOPY N/A 08/18/2023   Procedure: COLONOSCOPY;  Surgeon: Maryruth Ole DASEN, MD;  Location: [facility] ENDOSCOPY;  Service: Endoscopy;  Laterality: N/A;   ERCP N/A 08/23/2023   Procedure: ERCP, WITH INTERVENTION IF INDICATED;  Surgeon: Jinny Carmine, MD;  Location: [facility] ENDOSCOPY;  Service: Endoscopy;  Laterality: N/A;   ERCP N/A 10/03/2023    Procedure: ERCP, WITH INTERVENTION IF INDICATED;  Surgeon: Jinny Carmine, MD;  Location: [facility] ENDOSCOPY;  Service: Endoscopy;  Laterality: N/A;   ERCP N/A 12/25/2023   Procedure: ERCP, WITH INTERVENTION IF INDICATED;  Surgeon: Jinny Carmine, MD;  Location: [facility] ENDOSCOPY;  Service: Endoscopy;  Laterality: N/A;  STENT REMOVAL   ERCP N/A 03/25/2024   Procedure: ERCP, WITH INTERVENTION IF INDICATED;  Surgeon: Jinny Carmine, MD;  Location: [facility] ENDOSCOPY;  Service: Endoscopy;  Laterality: N/A;  STENT REMOVAL   LIVER BIOPSY     MOHS SURGERY     2025, left cheek   POLYPECTOMY  08/18/2023   Procedure: POLYPECTOMY, INTESTINE;  Surgeon: Maryruth Ole DASEN, MD;  Location: [facility] ENDOSCOPY;  Service: Endoscopy;;   PORTACATH PLACEMENT     THYROID  LOBECTOMY Right 04/22/2024   Procedure: LOBECTOMY, THYROID ;  Surgeon: Eletha Boas, MD;  Location: [facility] ORS;  Service: General;  Laterality: Right;   TONSILLECTOMY     1968    Social History   Socioeconomic History   Marital status: Divorced    Spouse name: Not on file   Number of children: Not on file   Years of education: Not on file   Highest education level: Not on file  Occupational History   Not on file  Tobacco Use   Smoking status: Former    Types: Cigarettes    Passive exposure: Never   Smokeless tobacco: Never  Vaping Use   Vaping status: Never Used  Substance and Sexual Activity   Alcohol use: Yes    Comment: occasional   Drug use: No   Sexual activity: Not on file  Other Topics Concern   Not on file  Social History Narrative   Not on file   Social Drivers of Health   Tobacco Use: Medium Risk (05/20/2024)   Patient History    Smoking Tobacco Use: Former    Smokeless Tobacco Use: Never    Passive Exposure: Never  Physicist, Medical Strain: Low Risk  (07/05/2023)   Received from [facility]   Overall Financial Resource Strain (CARDIA)    Difficulty of Paying Living Expenses: Not hard at all  Food Insecurity: No  Food Insecurity (04/22/2024)   Epic    Worried About Radiation Protection Practitioner of Food in the Last Year: Never true    Ran Out of Food in the Last Year: Never true  Transportation Needs: No Transportation Needs (04/22/2024)   Epic    Lack of Transportation (Medical): No    Lack of Transportation (Non-Medical): No  Physical Activity: Not on file  Stress: Not on file  Social Connections: Moderately Integrated (04/22/2024)   Social Connection and Isolation Panel    Frequency of Communication with Friends and Family: More than three times a week    Frequency of Social Gatherings with Friends and Family: More than three times a week    Attends Religious Services: More than 4 times per year    Active Member of [employer] or Organizations: Yes    Attends Engineer, Structural: More than 4 times per year    Marital Status: Divorced  Intimate  Partner Violence: Not At Risk (04/22/2024)   Epic    Fear of Current or Ex-Partner: No    Emotionally Abused: No    Physically Abused: No    Sexually Abused: No  Depression (PHQ2-9): Low Risk (05/20/2024)   Depression (PHQ2-9)    PHQ-2 Score: 0  Alcohol Screen: Not on file  Housing: Low Risk (04/22/2024)   Epic    Unable to Pay for Housing in the Last Year: No    Number of Times Moved in the Last Year: 0    Homeless in the Last Year: No  Utilities: Not At Risk (04/22/2024)   Epic    Threatened with loss of utilities: No  Health Literacy: Not on file    Family History  Problem Relation Age of Onset   Cancer Sister    Cancer Brother     Current Medications[2]  Physical exam:  Vitals:   05/20/24 1049 05/20/24 1056  BP: (!) 137/101 (!) 125/92  Pulse: 71   Resp: 19   Temp: 98.7 F (37.1 C)   TempSrc: Tympanic   SpO2: 97%   Weight: 231 lb (104.8 kg)   Height: 5' 6.5 (1.689 m)    Physical Exam Cardiovascular:     Rate and Rhythm: Normal rate and regular rhythm.     Heart sounds: Normal heart sounds.  Pulmonary:     Effort: Pulmonary effort  is normal.     Breath sounds: Normal breath sounds.  Abdominal:     General: Bowel sounds are normal. There is no distension.     Palpations: Abdomen is soft.     Tenderness: There is no abdominal tenderness.  Lymphadenopathy:     Comments: No palpable cervical, supraclavicular, axillary or inguinal adenopathy    Skin:    General: Skin is warm and dry.  Neurological:     Mental Status: He is alert and oriented to person, place, and time.      I have personally reviewed labs listed below:    Latest Ref Rng & Units 05/20/2024   10:32 AM  CMP  Glucose 70 - 99 mg/dL 893   BUN 8 - 23 mg/dL 19   Creatinine 9.38 - 1.24 mg/dL 8.99   Sodium 864 - 854 mmol/L 136   Potassium 3.5 - 5.1 mmol/L 3.5   Chloride 98 - 111 mmol/L 99   CO2 22 - 32 mmol/L 27   Calcium 8.9 - 10.3 mg/dL 9.8   Total Protein 6.5 - 8.1 g/dL 6.4   Total Bilirubin 0.0 - 1.2 mg/dL 0.3   Alkaline Phos 38 - 126 U/L 162   AST 15 - 41 U/L 43   ALT 0 - 44 U/L 49       Latest Ref Rng & Units 05/20/2024   10:32 AM  CBC  WBC 4.0 - 10.5 K/uL 6.9   Hemoglobin 13.0 - 17.0 g/dL 84.6   Hematocrit 60.9 - 52.0 % 44.0   Platelets 150 - 400 K/uL 198       Assessment and plan- Patient is a 64 y.o. 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  He is here for a routine surveillance visit  Assessment and Plan    Diffuse large B-cell lymphoma of solid organ - Clinically patient is doing well with no concerning signs and symptoms of recurrence based on today's exam Remission maintained with  no disease evidence on recent PET scan and clonoseq testing.  - Planned PET scan for end of March. - Continued surveillance with periodic imaging and blood tests. - Maintained port with flushes every two months; deferred port removal until after next PET scan or as desired.  Hrthle cell carcinoma of thyroid , post right  lobectomy Post right lobectomy for minimally invasive Hrthle cell carcinoma with no residual or recurrent disease. No adjuvant therapy needed. - Patient has an upcoming appointment with endocrinology to discuss further adjuvant options .  Chemotherapy-induced peripheral neuropathy Persistent neuropathy in left hand post-chemotherapy. - No new interventions required.  Abnormal liver function tests Mildly elevated AST and ALT without symptoms or biliary obstruction, likely transient. - Planned repeat liver function tests in a couple of weeks.         Visit Diagnosis 1. Abnormal LFTs   2. Encounter for follow-up surveillance of diffuse large B-cell lymphoma      Dr. Annah Skene, MD, MPH [facility] [phone] 05/20/2024 1:15 PM                    [1]  Allergies Allergen Reactions   Penicillins Other (See Comments)    Childhood allergy; causes high temp 104-105   Hydrocodone-Acetaminophen  Itching and Rash  [2]  Current Outpatient Medications:    acyclovir  (ZOVIRAX ) 400 MG tablet, TAKE 1 TABLET BY MOUTH EVERY DAY, Disp: 30 tablet, Rfl: 3   allopurinol  (ZYLOPRIM ) 300 MG tablet, TAKE 1 TABLET BY MOUTH EVERY DAY, Disp: 90 tablet, Rfl: 1   ALPRAZolam  (XANAX ) 1 MG tablet, Take 1 mg by mouth 2 (two) times daily as needed for anxiety., Disp: , Rfl:    amphetamine-dextroamphetamine (ADDERALL XR) 20 MG 24 hr capsule, Take 20 mg by mouth every morning., Disp: , Rfl:    buPROPion  (WELLBUTRIN  XL) 300 MG 24 hr tablet, Take 300 mg by mouth daily., Disp: , Rfl:    calcium-vitamin D (OSCAL WITH D) 500-5 MG-MCG tablet, Take 1 tablet by mouth daily., Disp: , Rfl:    cetirizine (ZYRTEC) 10 MG tablet, Take 10 mg by mouth daily., Disp: , Rfl:    Multiple Vitamin (MULTIVITAMIN ADULT PO), Take 1 tablet by mouth daily., Disp: , Rfl:    Polyethyl Glycol-Propyl Glycol (SYSTANE OP), Place 1 drop into both eyes as needed (dry eyes)., Disp: , Rfl:    potassium  chloride SA (KLOR-CON  M) 20 MEQ tablet, TAKE 1 TABLET BY MOUTH EVERY DAY, Disp: 90 tablet, Rfl: 1   propranolol ER (INDERAL LA) 60 MG 24 hr capsule, Take 60 mg by mouth., Disp: , Rfl:    telmisartan -hydrochlorothiazide  (MICARDIS  HCT) 80-25 MG tablet, Take 1 tablet by mouth daily., Disp: 90 tablet, Rfl: 0   traZODone  (DESYREL ) 100 MG tablet, Take 100 mg by mouth at bedtime., Disp: , Rfl:    lactulose  (CHRONULAC ) 10 GM/15ML solution, Take 15 mLs (10 g total) by mouth as needed for moderate constipation., Disp: 200 mL, Rfl: 1   lidocaine -prilocaine  (EMLA ) cream, Apply to affected area once (Patient not taking: Reported on 04/01/2024), Disp: 30 g, Rfl: 3   Na Sulfate-K Sulfate-Mg Sulfate concentrate (SUPREP) 17.5-3.13-1.6 GM/177ML SOLN, Take 354 mLs by mouth as directed. (Patient not taking: Reported on 10/25/2023), Disp: , Rfl:    naproxen  (NAPROSYN ) 500 MG tablet, Take 1 tablet (500 mg total) by mouth 2 (two) times daily with a meal. (Patient not taking: No sig reported), Disp: 20 tablet, Rfl: 0   nystatin  (MYCOSTATIN ) 100000  UNIT/ML suspension, Take 5 mLs (500,000 Units total) by mouth 4 (four) times daily. (Patient not taking: No sig reported), Disp: 60 mL, Rfl: 0   ondansetron  (ZOFRAN  ODT) 4 MG disintegrating tablet, Take 1 tablet (4 mg total) by mouth every 8 (eight) hours as needed for nausea or vomiting. (Patient not taking: No sig reported), Disp: 20 tablet, Rfl: 0   ondansetron  (ZOFRAN ) 8 MG tablet, Take 1 tablet (8 mg total) by mouth every 8 (eight) hours as needed for nausea or vomiting. Start on the third day after cyclophosphamide  chemotherapy. (Patient not taking: No sig reported), Disp: 30 tablet, Rfl: 1   pilocarpine  (SALAGEN ) 5 MG tablet, TAKE 1 TABLET BY MOUTH THREE TIMES A DAY (Patient not taking: Reported on 03/25/2024), Disp: 270 tablet, Rfl: 1   prochlorperazine  (COMPAZINE ) 10 MG tablet, Take 1 tablet (10 mg total) by mouth every 6 (six) hours as needed for nausea or vomiting. (Patient  not taking: No sig reported), Disp: 30 tablet, Rfl: 6  "

## 2024-05-20 NOTE — Progress Notes (Signed)
 Patient doing good; no new or acute concerns at this time.

## 2024-05-21 ENCOUNTER — Other Ambulatory Visit: Payer: Self-pay

## 2024-05-24 ENCOUNTER — Encounter: Payer: Self-pay | Admitting: Oncology

## 2024-05-31 ENCOUNTER — Telehealth: Payer: Self-pay

## 2024-05-31 ENCOUNTER — Other Ambulatory Visit: Payer: Self-pay

## 2024-05-31 ENCOUNTER — Inpatient Hospital Stay: Payer: Self-pay

## 2024-05-31 DIAGNOSIS — Z08 Encounter for follow-up examination after completed treatment for malignant neoplasm: Secondary | ICD-10-CM

## 2024-05-31 LAB — CMP (CANCER CENTER ONLY)
ALT: 50 U/L — ABNORMAL HIGH (ref 0–44)
AST: 46 U/L — ABNORMAL HIGH (ref 15–41)
Albumin: 4.3 g/dL (ref 3.5–5.0)
Alkaline Phosphatase: 174 U/L — ABNORMAL HIGH (ref 38–126)
Anion gap: 11 (ref 5–15)
BUN: 21 mg/dL (ref 8–23)
CO2: 27 mmol/L (ref 22–32)
Calcium: 9.8 mg/dL (ref 8.9–10.3)
Chloride: 101 mmol/L (ref 98–111)
Creatinine: 1 mg/dL (ref 0.61–1.24)
GFR, Estimated: >60 mL/min
Glucose, Bld: 99 mg/dL (ref 70–99)
Potassium: 3.6 mmol/L (ref 3.5–5.1)
Sodium: 139 mmol/L (ref 135–145)
Total Bilirubin: 0.4 mg/dL (ref 0.0–1.2)
Total Protein: 6.5 g/dL (ref 6.5–8.1)

## 2024-05-31 LAB — GAMMA GT: GGT: 285 U/L — ABNORMAL HIGH (ref 7–50)

## 2024-05-31 NOTE — Telephone Encounter (Signed)
 Per Dr. Melanee Andru Genter-patient has mildly abnormal LFTs which have persisted in the last 2 weeks. I would like him to get an MRI abdomen with MRCP sometime in the next 1 to 2 weeks. please let him know. Dr. Melanee also requested to add GGT to his labs from today.    Outbound call; informed patient of above. Stated he doesn't know if it had anything to do with his stent removal. Reviewed labs today as opposed to 3 months ago, stent removed 03/25/24.

## 2024-06-03 ENCOUNTER — Inpatient Hospital Stay: Payer: Self-pay

## 2024-06-04 ENCOUNTER — Encounter: Payer: Self-pay | Admitting: Oncology

## 2024-06-05 ENCOUNTER — Encounter: Payer: Self-pay | Admitting: Oncology

## 2024-06-06 ENCOUNTER — Encounter: Payer: Self-pay | Admitting: Oncology

## 2024-06-07 ENCOUNTER — Ambulatory Visit
Admission: RE | Admit: 2024-06-07 | Discharge: 2024-06-07 | Disposition: A | Payer: Self-pay | Source: Ambulatory Visit | Attending: Oncology

## 2024-06-07 ENCOUNTER — Encounter: Payer: Self-pay | Admitting: Oncology

## 2024-06-07 DIAGNOSIS — Z08 Encounter for follow-up examination after completed treatment for malignant neoplasm: Secondary | ICD-10-CM | POA: Insufficient documentation

## 2024-06-07 DIAGNOSIS — Z8579 Personal history of other malignant neoplasms of lymphoid, hematopoietic and related tissues: Secondary | ICD-10-CM | POA: Insufficient documentation

## 2024-06-07 MED ORDER — GADOBUTROL 1 MMOL/ML IV SOLN
10.0000 mL | Freq: Once | INTRAVENOUS | Status: AC | PRN
  Administered 2024-06-07: 10 mL via INTRAVENOUS

## 2024-06-08 ENCOUNTER — Other Ambulatory Visit: Payer: Self-pay | Admitting: Oncology

## 2024-06-08 DIAGNOSIS — C83398 Diffuse large b-cell lymphoma of other extranodal and solid organ sites: Secondary | ICD-10-CM

## 2024-06-11 ENCOUNTER — Encounter: Payer: Self-pay | Admitting: Oncology

## 2024-06-11 ENCOUNTER — Telehealth: Payer: Self-pay

## 2024-06-11 ENCOUNTER — Encounter: Payer: Self-pay | Admitting: Hospice and Palliative Medicine

## 2024-06-11 ENCOUNTER — Other Ambulatory Visit: Payer: Self-pay

## 2024-06-11 ENCOUNTER — Telehealth: Payer: Self-pay | Admitting: *Deleted

## 2024-06-11 DIAGNOSIS — C83398 Diffuse large b-cell lymphoma of other extranodal and solid organ sites: Secondary | ICD-10-CM

## 2024-06-11 DIAGNOSIS — K831 Obstruction of bile duct: Secondary | ICD-10-CM

## 2024-06-11 NOTE — Telephone Encounter (Signed)
 [facility] radiology called re: pt's MRI results to Dr. Melanee  IMPRESSION: 1. Interval removal of common bile duct stent. Short-segment stricturing of the upstream common bile duct at the hepatic hilum with mild-to-moderate intrahepatic bile duct dilation may be benign or malignant. Recommend further evaluation with ERCP. 2. Ill-defined soft tissue surrounding the celiac artery and superior mesenteric artery origins, likely treated disease. 3. Slightly increased size of 6 mm right phrenic lymph node, previously 4 mm. Recommend attention on follow-up. 4. Geographic hepatic steatosis.   These results will be called to the ordering clinician or representative by the Radiologist Assistant, and communication documented in the PACS or [employer].     Electronically Signed   By: Limin  Xu M.D.   On: 06/10/2024 10:24

## 2024-06-11 NOTE — Telephone Encounter (Signed)
 bile duct dilation   Yes a repeat ERCP will be needed for brushings and stenting.

## 2024-06-12 ENCOUNTER — Inpatient Hospital Stay: Payer: Self-pay | Attending: Oncology

## 2024-06-12 ENCOUNTER — Encounter: Payer: Self-pay | Admitting: Oncology

## 2024-06-12 DIAGNOSIS — C83398 Diffuse large b-cell lymphoma of other extranodal and solid organ sites: Secondary | ICD-10-CM

## 2024-06-12 LAB — CMP (CANCER CENTER ONLY)
ALT: 48 U/L — ABNORMAL HIGH (ref 0–44)
AST: 44 U/L — ABNORMAL HIGH (ref 15–41)
Albumin: 4.4 g/dL (ref 3.5–5.0)
Alkaline Phosphatase: 168 U/L — ABNORMAL HIGH (ref 38–126)
Anion gap: 13 (ref 5–15)
BUN: 18 mg/dL (ref 8–23)
CO2: 25 mmol/L (ref 22–32)
Calcium: 9.8 mg/dL (ref 8.9–10.3)
Chloride: 99 mmol/L (ref 98–111)
Creatinine: 1.03 mg/dL (ref 0.61–1.24)
GFR, Estimated: >60 mL/min
Glucose, Bld: 117 mg/dL — ABNORMAL HIGH (ref 70–99)
Potassium: 3.8 mmol/L (ref 3.5–5.1)
Sodium: 138 mmol/L (ref 135–145)
Total Bilirubin: 0.8 mg/dL (ref 0.0–1.2)
Total Protein: 6.7 g/dL (ref 6.5–8.1)

## 2024-06-18 ENCOUNTER — Ambulatory Visit: Admission: RE | Admit: 2024-06-18 | Payer: Self-pay | Admitting: Gastroenterology

## 2024-06-18 ENCOUNTER — Encounter: Admission: RE | Payer: Self-pay

## 2024-07-30 ENCOUNTER — Other Ambulatory Visit: Payer: Self-pay

## 2024-08-12 ENCOUNTER — Inpatient Hospital Stay: Payer: Self-pay | Admitting: Oncology

## 2024-08-12 ENCOUNTER — Inpatient Hospital Stay: Payer: Self-pay
